# Patient Record
Sex: Female | Born: 1990 | Race: White | Hispanic: No | Marital: Single | Smoking: Never smoker
Health system: Southern US, Community
[De-identification: ages and names within clinical notes are randomized; demographics above are authoritative.]

## PROBLEM LIST (undated history)

## (undated) DIAGNOSIS — N809 Endometriosis, unspecified: Secondary | ICD-10-CM

## (undated) DIAGNOSIS — R350 Frequency of micturition: Secondary | ICD-10-CM

## (undated) DIAGNOSIS — G43909 Migraine, unspecified, not intractable, without status migrainosus: Secondary | ICD-10-CM

## (undated) DIAGNOSIS — R102 Pelvic and perineal pain: Secondary | ICD-10-CM

## (undated) DIAGNOSIS — G43019 Migraine without aura, intractable, without status migrainosus: Secondary | ICD-10-CM

## (undated) DIAGNOSIS — E282 Polycystic ovarian syndrome: Secondary | ICD-10-CM

## (undated) HISTORY — PX: TONSILLECTOMY: SUR1361

## (undated) HISTORY — PX: ABDOMINAL HYSTERECTOMY: SHX81

## (undated) HISTORY — DX: Migraine without aura, intractable, without status migrainosus: G43.019

## (undated) HISTORY — DX: Migraine, unspecified, not intractable, without status migrainosus: G43.909

## (undated) HISTORY — PX: OTHER SURGICAL HISTORY: SHX169

---

## 2010-01-30 ENCOUNTER — Emergency Department (HOSPITAL_BASED_OUTPATIENT_CLINIC_OR_DEPARTMENT_OTHER): Admission: EM | Admit: 2010-01-30 | Discharge: 2010-01-30 | Payer: Self-pay | Admitting: Emergency Medicine

## 2010-03-21 ENCOUNTER — Emergency Department (HOSPITAL_BASED_OUTPATIENT_CLINIC_OR_DEPARTMENT_OTHER): Admission: EM | Admit: 2010-03-21 | Discharge: 2010-03-21 | Payer: Self-pay | Admitting: Emergency Medicine

## 2010-06-29 ENCOUNTER — Ambulatory Visit: Payer: Self-pay | Admitting: Diagnostic Radiology

## 2010-06-29 ENCOUNTER — Emergency Department (HOSPITAL_BASED_OUTPATIENT_CLINIC_OR_DEPARTMENT_OTHER): Admission: EM | Admit: 2010-06-29 | Discharge: 2010-06-29 | Payer: Self-pay | Admitting: Emergency Medicine

## 2010-07-09 ENCOUNTER — Emergency Department (HOSPITAL_BASED_OUTPATIENT_CLINIC_OR_DEPARTMENT_OTHER): Admission: EM | Admit: 2010-07-09 | Discharge: 2010-07-09 | Payer: Self-pay | Admitting: Emergency Medicine

## 2010-12-09 LAB — URINALYSIS, ROUTINE W REFLEX MICROSCOPIC
Bilirubin Urine: NEGATIVE
Glucose, UA: NEGATIVE mg/dL
Leukocytes, UA: NEGATIVE
Specific Gravity, Urine: 1.021 (ref 1.005–1.030)
Specific Gravity, Urine: 1.028 (ref 1.005–1.030)
Urobilinogen, UA: 0.2 mg/dL (ref 0.0–1.0)
pH: 5.5 (ref 5.0–8.0)
pH: 6 (ref 5.0–8.0)

## 2010-12-09 LAB — URINE MICROSCOPIC-ADD ON

## 2010-12-09 LAB — DIFFERENTIAL
Lymphs Abs: 2.4 10*3/uL (ref 0.7–4.0)
Monocytes Absolute: 0.4 10*3/uL (ref 0.1–1.0)
Monocytes Relative: 5 % (ref 3–12)
Neutro Abs: 5.1 10*3/uL (ref 1.7–7.7)
Neutrophils Relative %: 63 % (ref 43–77)

## 2010-12-09 LAB — WET PREP, GENITAL: Yeast Wet Prep HPF POC: NONE SEEN

## 2010-12-09 LAB — BASIC METABOLIC PANEL
CO2: 26 mEq/L (ref 19–32)
Calcium: 9.2 mg/dL (ref 8.4–10.5)
GFR calc Af Amer: >60 mL/min (ref >60)
GFR calc non Af Amer: >60 mL/min (ref >60)
Potassium: 3.8 mEq/L (ref 3.5–5.1)
Sodium: 139 mEq/L (ref 135–145)

## 2010-12-09 LAB — CBC
HCT: 38 % (ref 36.0–46.0)
Hemoglobin: 13.1 g/dL (ref 12.0–15.0)
RBC: 4.2 MIL/uL (ref 3.87–5.11)

## 2010-12-09 LAB — GC/CHLAMYDIA PROBE AMP, GENITAL
GC Probe Amp, Genital: NEGATIVE
GC Probe Amp, Genital: NEGATIVE

## 2010-12-09 LAB — PREGNANCY, URINE: Preg Test, Ur: NEGATIVE

## 2010-12-12 LAB — URINALYSIS, ROUTINE W REFLEX MICROSCOPIC
Glucose, UA: NEGATIVE mg/dL
Protein, ur: 100 mg/dL — AB
Specific Gravity, Urine: 1.037 — ABNORMAL HIGH (ref 1.005–1.030)

## 2010-12-12 LAB — URINE CULTURE: Culture: NO GROWTH

## 2010-12-12 LAB — URINE MICROSCOPIC-ADD ON

## 2010-12-23 ENCOUNTER — Emergency Department (INDEPENDENT_AMBULATORY_CARE_PROVIDER_SITE_OTHER)

## 2010-12-23 ENCOUNTER — Emergency Department (HOSPITAL_BASED_OUTPATIENT_CLINIC_OR_DEPARTMENT_OTHER)
Admission: EM | Admit: 2010-12-23 | Discharge: 2010-12-23 | Disposition: A | Discharge status: Home / Self Care | Attending: Emergency Medicine | Admitting: Emergency Medicine

## 2010-12-23 ENCOUNTER — Emergency Department (HOSPITAL_BASED_OUTPATIENT_CLINIC_OR_DEPARTMENT_OTHER)

## 2010-12-23 DIAGNOSIS — M7989 Other specified soft tissue disorders: Secondary | ICD-10-CM

## 2010-12-23 DIAGNOSIS — M25469 Effusion, unspecified knee: Secondary | ICD-10-CM | POA: Insufficient documentation

## 2010-12-23 DIAGNOSIS — M25569 Pain in unspecified knee: Secondary | ICD-10-CM

## 2010-12-24 ENCOUNTER — Ambulatory Visit (INDEPENDENT_AMBULATORY_CARE_PROVIDER_SITE_OTHER): Admitting: Family Medicine

## 2010-12-24 ENCOUNTER — Encounter: Payer: Self-pay | Admitting: Family Medicine

## 2010-12-24 VITALS — BP 117/80 | HR 80 | Temp 98.1°F | Ht 63.0 in | Wt 112.0 lb

## 2010-12-24 DIAGNOSIS — M25569 Pain in unspecified knee: Secondary | ICD-10-CM

## 2010-12-24 DIAGNOSIS — M25562 Pain in left knee: Secondary | ICD-10-CM

## 2010-12-24 MED ORDER — HYDROCODONE-ACETAMINOPHEN 5-500 MG PO TABS: 1.0000 | ORAL_TABLET | Freq: Four times a day (QID) | ORAL | Status: DC | PRN

## 2010-12-24 NOTE — Patient Instructions (Signed)
The swelling of your knee, pain on the outside, lack of motion are all concerning for a meniscal tear or other damage to the inside of your knee. We will move forward with an MRI to further assess this - we will call you with the date and time of this. Take aleve 2 tabs twice a day with food in the meantime (stop the ibuprofen). Discontinue the knee immobilizer. Use crutches as needed. Ice the area 15 minutes at a time 3-4 times a day. Do straight leg raises and quad sets 3 sets of 15 once a day to maintain your quad strength. Consider ACE wrap to help with comfort and swelling. We will contact you with the MRI results and how to proceed following this.

## 2010-12-24 NOTE — Progress Notes (Signed)
  Subjective:    Patient ID: Amanda Vazquez, female    DOB: 04/18/1991, 20 y.o.   MRN: [id number]  HPI 20 yo F here with left knee pain  Patient reports no known injury States yesterday was on her feet a lot when started developing anterolateral left knee pain Also concurrent with swelling, difficulty bearing weight, and inability to fully extend or flex knee. No true catching or locking. No giving out. No prior left knee injuries. Went to ED where x-rays were negative - placed in immobilizer and given crutches.  Also given ibuprofen 800 which she has been taking but not noticed any benefit. No FH gout.  History reviewed. No pertinent past medical history.  No current outpatient prescriptions on file prior to visit.    History reviewed. No pertinent past surgical history.  No Known Allergies  History   Social History  . Marital Status: Single    Spouse Name: N/A    Number of Children: N/A  . Years of Education: N/A   Occupational History  . Not on file.   Social History Main Topics  . Smoking status: Never Smoker   . Smokeless tobacco: Not on file  . Alcohol Use: Not on file  . Drug Use: Not on file  . Sexually Active: Not on file   Other Topics Concern  . Not on file   Social History Narrative  . No narrative on file    Family History  Problem Relation Age of Onset  . Diabetes Sister   . Diabetes Maternal Grandfather   . Hypertension Maternal Grandfather   . Hypertension Paternal Grandmother   . Heart attack Neg Hx     BP 117/80  Pulse 80  Temp(Src) 98.1 F (36.7 C) (Oral)  Ht 5\' 3"  (1.6 m)  Wt 112 lb (50.803 kg)  BMI 19.84 kg/m2  Review of Systems See HPI above.    Objective:   Physical Exam Gen: NAD L knee: Mild effusion and warmth.  No deformity, bruising otherwise. TTP lateral joint line.  No medial joint line, post patellar or other TTP about left knee. ROM 5-90 degrees - pain at extents of motion. Stable to valgus/varus stress  without pain Negative ant/post drawers and negative lachmanns Mcmurrays with pain laterally.  + Apleys.  Unable to do sit home 2/2 pain.  Difficulty bearing weight to allow for thessalys Negative apprehension. NVI distally.  R knee: FROM without pain, swelling, weakness, instability.    Assessment & Plan:  1. L knee pain:  Reviewed radiographs which were negative except for joint effusion.  No h/o gout or FH gout but no acute injury.  Pain mostly lateral with pain on mcmurrays and decreased ROM.  Concerned about intraarticular pathology - will proceed with MRI to further assess.  Ligamentous testing negative.  Negative apprehension so doubt patellar subluxation/dislocation.  Discontinue immobilizer.  Quad strengthening exercises, aleve, icing.  Will call with MRI results.

## 2010-12-24 NOTE — Assessment & Plan Note (Signed)
Reviewed radiographs which were negative except for joint effusion.  No h/o gout or FH gout but no acute injury.  Pain mostly lateral with pain on mcmurrays and decreased ROM.  Concerned about intraarticular pathology - will proceed with MRI to further assess.  Ligamentous testing negative.  Negative apprehension so doubt patellar subluxation/dislocation.  Discontinue immobilizer.  Quad strengthening exercises, aleve, icing.  Will call with MRI results.

## 2010-12-25 ENCOUNTER — Ambulatory Visit (HOSPITAL_BASED_OUTPATIENT_CLINIC_OR_DEPARTMENT_OTHER)
Admission: RE | Admit: 2010-12-25 | Discharge: 2010-12-25 | Disposition: A | Discharge status: Home / Self Care | Source: Ambulatory Visit | Attending: Family Medicine | Admitting: Family Medicine

## 2010-12-25 ENCOUNTER — Emergency Department (HOSPITAL_BASED_OUTPATIENT_CLINIC_OR_DEPARTMENT_OTHER)
Admission: EM | Admit: 2010-12-25 | Discharge: 2010-12-25 | Disposition: A | Discharge status: Home / Self Care | Attending: Emergency Medicine | Admitting: Emergency Medicine

## 2010-12-25 DIAGNOSIS — N9489 Other specified conditions associated with female genital organs and menstrual cycle: Secondary | ICD-10-CM | POA: Insufficient documentation

## 2010-12-25 DIAGNOSIS — N83209 Unspecified ovarian cyst, unspecified side: Secondary | ICD-10-CM | POA: Insufficient documentation

## 2010-12-25 DIAGNOSIS — M25569 Pain in unspecified knee: Secondary | ICD-10-CM | POA: Insufficient documentation

## 2010-12-25 DIAGNOSIS — R109 Unspecified abdominal pain: Secondary | ICD-10-CM | POA: Insufficient documentation

## 2010-12-25 DIAGNOSIS — M25419 Effusion, unspecified shoulder: Secondary | ICD-10-CM | POA: Insufficient documentation

## 2010-12-25 LAB — PREGNANCY, URINE: Preg Test, Ur: NEGATIVE

## 2010-12-25 LAB — WET PREP, GENITAL: Yeast Wet Prep HPF POC: NONE SEEN

## 2010-12-25 LAB — URINALYSIS, ROUTINE W REFLEX MICROSCOPIC
Hgb urine dipstick: NEGATIVE
Nitrite: NEGATIVE
Specific Gravity, Urine: 1.021 (ref 1.005–1.030)
Urobilinogen, UA: 0.2 mg/dL (ref 0.0–1.0)

## 2010-12-27 ENCOUNTER — Encounter: Payer: Self-pay | Admitting: Family Medicine

## 2010-12-27 ENCOUNTER — Other Ambulatory Visit: Payer: Self-pay | Admitting: Family Medicine

## 2010-12-27 ENCOUNTER — Ambulatory Visit (INDEPENDENT_AMBULATORY_CARE_PROVIDER_SITE_OTHER): Admitting: Family Medicine

## 2010-12-27 VITALS — BP 100/69 | HR 76 | Temp 98.1°F

## 2010-12-27 DIAGNOSIS — M25562 Pain in left knee: Secondary | ICD-10-CM

## 2010-12-27 DIAGNOSIS — M25469 Effusion, unspecified knee: Secondary | ICD-10-CM

## 2010-12-27 DIAGNOSIS — M25569 Pain in unspecified knee: Secondary | ICD-10-CM

## 2010-12-27 DIAGNOSIS — M25462 Effusion, left knee: Secondary | ICD-10-CM

## 2010-12-27 NOTE — Assessment & Plan Note (Signed)
Advised given effusion without evidence of meniscal tear, ligament tear, OCD, or other intraarticular pathology we should proceed with aspiration and send for crystal analysis, cell count, culture.  Only able to obtain 8mL after procedure - advised lab to add gram stain and glucose/protein if were able to but the other three are top priority.  Differential includes gout, septic arthritis (afebrile, no redness or source - unlikely), inflammatory arthritis, lyme, among others.  Will contact patient with results of labwork when available - may need to proceed with bloodwork +/- rheum referral.  Continue icing, aleve in meantime.  INJECTION: Patient was given informed consent, signed copy in the chart. Appropriate time out was taken. Area prepped and draped in usual sterile fashion. Patient lying supine on exam table - using superolateral approach, 4 mL marcaine used for local after iodine/alcohol swab to prep area.  Then large bore needle and syringe used - aspirated about 8 mL of clear straw-colored fluid from knee joint.  Sent for labs as noted above.  The patient tolerated the procedure well. There were no complications. Post procedure instructions were given.

## 2010-12-27 NOTE — Progress Notes (Signed)
Subjective:    Patient ID: Amanda Vazquez, female    DOB: 1991/05/11, 20 y.o.   MRN: [id number]  Knee Pain   20 yo F here for f/u left knee pain  Patient reported no known injury Stated was on her feet a lot late last week when started developing anterolateral left knee pain Also concurrent with swelling, difficulty bearing weight, and inability to fully extend or flex knee. No true catching or locking. No giving out. No prior left knee injuries. Went to ED where x-rays were negative - placed in immobilizer and given crutches.  Also given ibuprofen 800 which she had been taking but not noticed any benefit. No FH gout, inflammatory joint disease Denies tick exposure, h/o gonorrhea, personal h/o gout, fevers. MRI showed mod effusion but no evidence of meniscal, ligamentous or other intraarticular pathology.  History reviewed. No pertinent past medical history.  Current Outpatient Prescriptions on File Prior to Visit  Medication Sig Dispense Refill  . HYDROcodone-acetaminophen (VICODIN) 5-500 MG per tablet Take 1 tablet by mouth every 6 (six) hours as needed for pain.  30 tablet  0    History reviewed. No pertinent past surgical history.  No Known Allergies  History   Social History  . Marital Status: Single    Spouse Name: N/A    Number of Children: N/A  . Years of Education: N/A   Occupational History  . Not on file.   Social History Main Topics  . Smoking status: Never Smoker   . Smokeless tobacco: Not on file  . Alcohol Use: Not on file  . Drug Use: Not on file  . Sexually Active: Not on file   Other Topics Concern  . Not on file   Social History Narrative  . No narrative on file    Family History  Problem Relation Age of Onset  . Diabetes Sister   . Diabetes Maternal Grandfather   . Hypertension Maternal Grandfather   . Hypertension Paternal Grandmother   . Heart attack Neg Hx     BP 100/69  Pulse 76  Temp 98.1 F (36.7 C)  Review of  Systems  See HPI above.    Objective:   Physical Exam  Gen: NAD L knee: Mild effusion, minimal warmth.  No deformity, bruising otherwise. Mild TTP lateral joint line.  No medial joint line, post patellar or other TTP about left knee. ROM 5-90 degrees - pain at extents of motion. Stable to valgus/varus stress without pain NVI distally.  R knee: FROM without pain, swelling, weakness, instability.    Assessment & Plan:  1. L knee pain:  Advised given effusion without evidence of meniscal tear, ligament tear, OCD, or other intraarticular pathology we should proceed with aspiration and send for crystal analysis, cell count, culture.  Only able to obtain 8mL after procedure - advised lab to add gram stain and glucose/protein if were able to but the other three are top priority.  Differential includes gout, septic arthritis (afebrile, no redness or source - unlikely), inflammatory arthritis, lyme, among others.  Will contact patient with results of labwork when available - may need to proceed with bloodwork +/- rheum referral.  Continue icing, aleve in meantime.  INJECTION: Patient was given informed consent, signed copy in the chart. Appropriate time out was taken. Area prepped and draped in usual sterile fashion. Patient lying supine on exam table - using superolateral approach, 4 mL marcaine used for local after iodine/alcohol swab to prep area.  Then large bore needle  and syringe used - aspirated about 8 mL of clear straw-colored fluid from knee joint.  Sent for labs as noted above.  The patient tolerated the procedure well. There were no complications. Post procedure instructions were given.

## 2010-12-28 LAB — SYNOVIAL CELL COUNT + DIFF, W/ CRYSTALS
Crystals, Fluid: NONE SEEN
Eosinophils-Synovial: 1 % (ref 0–1)
Lymphocytes-Synovial Fld: 25 % — ABNORMAL HIGH (ref 0–20)
Monocyte/Macrophage: 73 % (ref 50–90)
Neutrophil, Synovial: 1 % (ref 0–25)
WBC, Synovial: 3420 uL — ABNORMAL HIGH (ref 0–200)

## 2010-12-30 NOTE — Assessment & Plan Note (Signed)
Spoke with patient regarding synovial fluid results.  Only able to obtain 8cc of fluid - analyzed for crystals and cell count though did not appear infectious on aspiration.  No evidence of crystals and WBC count <4000 with only 1% neutrophils.  Will proceed with bloodwork (cbc with diff, cmp, esr, crp, ana, RF, urine GC, Lyme Abs) and referral to rheumatology.  Continue with nsaids, icing in meantime.  Patient plans to come in on Monday for labwork.

## 2010-12-30 NOTE — Progress Notes (Signed)
Addended byNorton Blizzard on: 12/30/2010 10:20 AM   Modules accepted: Orders

## 2011-01-25 ENCOUNTER — Encounter: Payer: Self-pay | Admitting: *Deleted

## 2011-01-31 ENCOUNTER — Encounter: Payer: Self-pay | Admitting: Family Medicine

## 2011-03-27 ENCOUNTER — Emergency Department (HOSPITAL_COMMUNITY)
Admission: EM | Admit: 2011-03-27 | Discharge: 2011-03-27 | Disposition: A | Discharge status: Home / Self Care | Source: Home / Self Care | Attending: Emergency Medicine | Admitting: Emergency Medicine

## 2011-03-27 ENCOUNTER — Emergency Department (HOSPITAL_COMMUNITY)

## 2011-03-27 ENCOUNTER — Emergency Department (HOSPITAL_COMMUNITY)
Admission: EM | Admit: 2011-03-27 | Discharge: 2011-03-27 | Disposition: A | Discharge status: Other Acute Inpatient Hospital | Attending: Emergency Medicine | Admitting: Emergency Medicine

## 2011-03-27 DIAGNOSIS — N760 Acute vaginitis: Secondary | ICD-10-CM | POA: Insufficient documentation

## 2011-03-27 DIAGNOSIS — R1012 Left upper quadrant pain: Secondary | ICD-10-CM | POA: Insufficient documentation

## 2011-03-27 DIAGNOSIS — R1110 Vomiting, unspecified: Secondary | ICD-10-CM | POA: Insufficient documentation

## 2011-03-27 DIAGNOSIS — N949 Unspecified condition associated with female genital organs and menstrual cycle: Secondary | ICD-10-CM | POA: Insufficient documentation

## 2011-03-27 DIAGNOSIS — R10814 Left lower quadrant abdominal tenderness: Secondary | ICD-10-CM | POA: Insufficient documentation

## 2011-03-27 DIAGNOSIS — A499 Bacterial infection, unspecified: Secondary | ICD-10-CM | POA: Insufficient documentation

## 2011-03-27 DIAGNOSIS — B9689 Other specified bacterial agents as the cause of diseases classified elsewhere: Secondary | ICD-10-CM | POA: Insufficient documentation

## 2011-03-27 LAB — CBC
HCT: 40.8 % (ref 36.0–46.0)
Hemoglobin: 14.7 g/dL (ref 12.0–15.0)
MCH: 30.1 pg (ref 26.0–34.0)
MCHC: 36 g/dL (ref 30.0–36.0)
MCV: 83.4 fL (ref 78.0–100.0)

## 2011-03-27 LAB — URINALYSIS, ROUTINE W REFLEX MICROSCOPIC
Bilirubin Urine: NEGATIVE
Hgb urine dipstick: NEGATIVE
Specific Gravity, Urine: 1.017 (ref 1.005–1.030)
pH: 6.5 (ref 5.0–8.0)

## 2011-03-27 LAB — BASIC METABOLIC PANEL
CO2: 25 mEq/L (ref 19–32)
GFR calc non Af Amer: >60 mL/min (ref >60)
Glucose, Bld: 95 mg/dL (ref 70–99)
Potassium: 3.6 mEq/L (ref 3.5–5.1)
Sodium: 137 mEq/L (ref 135–145)

## 2011-03-27 LAB — DIFFERENTIAL
Lymphocytes Relative: 33 % (ref 12–46)
Lymphs Abs: 3.6 10*3/uL (ref 0.7–4.0)
Monocytes Absolute: 0.8 10*3/uL (ref 0.1–1.0)
Monocytes Relative: 7 % (ref 3–12)
Neutro Abs: 6.4 10*3/uL (ref 1.7–7.7)

## 2011-03-27 LAB — WET PREP, GENITAL: Trich, Wet Prep: NONE SEEN

## 2011-03-28 LAB — GC/CHLAMYDIA PROBE AMP, GENITAL: Chlamydia, DNA Probe: NEGATIVE

## 2011-04-21 ENCOUNTER — Emergency Department (HOSPITAL_BASED_OUTPATIENT_CLINIC_OR_DEPARTMENT_OTHER)
Admission: EM | Admit: 2011-04-21 | Discharge: 2011-04-21 | Disposition: A | Discharge status: Home / Self Care | Attending: Emergency Medicine | Admitting: Emergency Medicine

## 2011-04-21 ENCOUNTER — Encounter (HOSPITAL_BASED_OUTPATIENT_CLINIC_OR_DEPARTMENT_OTHER): Payer: Self-pay | Admitting: Student

## 2011-04-21 ENCOUNTER — Emergency Department (INDEPENDENT_AMBULATORY_CARE_PROVIDER_SITE_OTHER)

## 2011-04-21 DIAGNOSIS — R1031 Right lower quadrant pain: Secondary | ICD-10-CM

## 2011-04-21 DIAGNOSIS — R110 Nausea: Secondary | ICD-10-CM | POA: Insufficient documentation

## 2011-04-21 DIAGNOSIS — R109 Unspecified abdominal pain: Secondary | ICD-10-CM

## 2011-04-21 DIAGNOSIS — N949 Unspecified condition associated with female genital organs and menstrual cycle: Secondary | ICD-10-CM

## 2011-04-21 HISTORY — DX: Polycystic ovarian syndrome: E28.2

## 2011-04-21 LAB — COMPREHENSIVE METABOLIC PANEL
ALT: 12 U/L (ref 0–35)
AST: 17 U/L (ref 0–37)
Alkaline Phosphatase: 49 U/L (ref 39–117)
CO2: 27 mEq/L (ref 19–32)
Chloride: 102 mEq/L (ref 96–112)
GFR calc non Af Amer: >60 mL/min (ref >60)
Potassium: 4.6 mEq/L (ref 3.5–5.1)
Sodium: 139 mEq/L (ref 135–145)
Total Bilirubin: 0.7 mg/dL (ref 0.3–1.2)

## 2011-04-21 LAB — COMPREHENSIVE METABOLIC PANEL WITH GFR
Albumin: 4.8 g/dL (ref 3.5–5.2)
BUN: 11 mg/dL (ref 6–23)
Calcium: 10 mg/dL (ref 8.4–10.5)
Creatinine, Ser: 0.6 mg/dL (ref 0.50–1.10)
GFR calc Af Amer: >60 mL/min (ref >60)
Glucose, Bld: 90 mg/dL (ref 70–99)
Total Protein: 7.6 g/dL (ref 6.0–8.3)

## 2011-04-21 LAB — DIFFERENTIAL
Basophils Absolute: 0 10*3/uL (ref 0.0–0.1)
Basophils Relative: 0 % (ref 0–1)
Eosinophils Absolute: 0.2 K/uL (ref 0.0–0.7)
Eosinophils Relative: 1 % (ref 0–5)
Lymphocytes Relative: 5 % — ABNORMAL LOW (ref 12–46)
Lymphs Abs: 1 K/uL (ref 0.7–4.0)
Monocytes Absolute: 1.2 10*3/uL — ABNORMAL HIGH (ref 0.1–1.0)
Monocytes Relative: 5 % (ref 3–12)
Neutro Abs: 19.7 10*3/uL — ABNORMAL HIGH (ref 1.7–7.7)
Neutrophils Relative %: 89 % — ABNORMAL HIGH (ref 43–77)

## 2011-04-21 LAB — CBC
HCT: 43.4 % (ref 36.0–46.0)
Hemoglobin: 15.5 g/dL — ABNORMAL HIGH (ref 12.0–15.0)
MCH: 30.2 pg (ref 26.0–34.0)
MCHC: 35.7 g/dL (ref 30.0–36.0)
MCV: 84.6 fL (ref 78.0–100.0)
Platelets: 294 10*3/uL (ref 150–400)
RBC: 5.13 MIL/uL — ABNORMAL HIGH (ref 3.87–5.11)
RDW: 12.4 % (ref 11.5–15.5)
WBC: 22.1 10*3/uL — ABNORMAL HIGH (ref 4.0–10.5)

## 2011-04-21 LAB — URINALYSIS, ROUTINE W REFLEX MICROSCOPIC
Bilirubin Urine: NEGATIVE
Glucose, UA: NEGATIVE mg/dL
Hgb urine dipstick: NEGATIVE
Ketones, ur: NEGATIVE mg/dL
Leukocytes, UA: NEGATIVE
Nitrite: NEGATIVE
Protein, ur: NEGATIVE mg/dL
Specific Gravity, Urine: 1.02 (ref 1.005–1.030)
Urobilinogen, UA: 0.2 mg/dL (ref 0.0–1.0)
pH: 5.5 (ref 5.0–8.0)

## 2011-04-21 LAB — WET PREP, GENITAL
Clue Cells Wet Prep HPF POC: NONE SEEN
Trich, Wet Prep: NONE SEEN
WBC, Wet Prep HPF POC: NONE SEEN
Yeast Wet Prep HPF POC: NONE SEEN

## 2011-04-21 LAB — PREGNANCY, URINE: Preg Test, Ur: NEGATIVE

## 2011-04-21 MED ORDER — MORPHINE SULFATE 4 MG/ML IJ SOLN
4.0000 mg | Freq: Once | INTRAMUSCULAR | Status: AC
  Administered 2011-04-21: 4 mg via INTRAVENOUS

## 2011-04-21 MED ORDER — MORPHINE SULFATE 4 MG/ML IJ SOLN
4.0000 mg | Freq: Once | INTRAMUSCULAR | Status: AC
  Administered 2011-04-21: 4 mg via INTRAVENOUS
  Filled 2011-04-21: qty 1

## 2011-04-21 MED ORDER — HYDROCODONE-ACETAMINOPHEN 5-325 MG PO TABS: 1.0000 | ORAL_TABLET | ORAL | Status: AC | PRN

## 2011-04-21 MED ORDER — MORPHINE SULFATE 4 MG/ML IJ SOLN
INTRAMUSCULAR | Status: AC
  Administered 2011-04-21: 4 mg via INTRAVENOUS
  Filled 2011-04-21: qty 1

## 2011-04-21 MED ORDER — ONDANSETRON HCL 4 MG/2ML IJ SOLN
4.0000 mg | Freq: Once | INTRAMUSCULAR | Status: AC
  Administered 2011-04-21: 4 mg via INTRAVENOUS
  Filled 2011-04-21: qty 2

## 2011-04-21 MED ORDER — ONDANSETRON 8 MG PO TBDP: 8.0000 mg | ORAL_TABLET | Freq: Three times a day (TID) | ORAL | Status: DC | PRN

## 2011-04-21 MED ORDER — ONDANSETRON 8 MG PO TBDP: 8.0000 mg | ORAL_TABLET | Freq: Three times a day (TID) | ORAL | Status: AC | PRN

## 2011-04-21 MED ORDER — METOCLOPRAMIDE HCL 5 MG/ML IJ SOLN
10.0000 mg | Freq: Once | INTRAMUSCULAR | Status: AC
  Administered 2011-04-21: 10 mg via INTRAVENOUS
  Filled 2011-04-21: qty 2

## 2011-04-21 MED ORDER — SODIUM CHLORIDE 0.9 % IV BOLUS (SEPSIS)
1000.0000 mL | Freq: Once | INTRAVENOUS | Status: AC
  Administered 2011-04-21: 1000 mL via INTRAVENOUS

## 2011-04-21 MED ORDER — IOHEXOL 300 MG/ML  SOLN
100.0000 mL | Freq: Once | INTRAMUSCULAR | Status: AC | PRN
  Administered 2011-04-21: 100 mL via INTRAVENOUS

## 2011-04-21 NOTE — ED Provider Notes (Signed)
Supervised pt's visit with PAC Schinlever  Gavin Pound. Oletta Lamas, MD 04/21/11 2146

## 2011-04-21 NOTE — Discharge Instructions (Signed)
Take vicodin as prescribed for severe pain.   Do not drive within four hours of taking this medication (may cause drowsiness or confusion).  Take zofran as needed for nausea.   Follow up with your primary care doctor and/or gynecologist if symptoms persist.  You should return to the ER if your abdominal pain worsens, you develop fever or you have uncontrolled vomiting.

## 2011-04-21 NOTE — ED Notes (Signed)
Pt in with c/o generalized upper and mid abd pain with sudden onset and with associated N V with onset. Pt reports to ED from [facility] with request for rule our appendicitis.

## 2011-04-21 NOTE — ED Notes (Signed)
Patient is resting comfortably. No needs voiced at this time 

## 2011-04-21 NOTE — ED Notes (Signed)
Pt drinking contrast for CT Scan. No further needs at this time.

## 2011-04-21 NOTE — ED Provider Notes (Signed)
History     Chief Complaint  Patient presents with  . Abdominal Pain   HPI Pt referred by physician at walk in clinic for evaluation for appendicitis.  She had acute onset nausea at 10am today and vomiting, diffuse abd pain at 11.  No f/c, hematemesis, diarrhea, GU symptoms.   Recent exposure to gastroenteritis.  H/o hemorrhagic ovarian cyst but this feels different.  No h/o abd surgeries.  Had leukocytosis at walk-in clinic.    Past Medical History  Diagnosis Date  . Polycystic ovary disease     History reviewed. No pertinent past surgical history.  Family History  Problem Relation Age of Onset  . Diabetes Sister   . Diabetes Maternal Grandfather   . Hypertension Maternal Grandfather   . Hypertension Paternal Grandmother   . Heart attack Neg Hx     History  Substance Use Topics  . Smoking status: Never Smoker   . Smokeless tobacco: Never Used  . Alcohol Use: Yes    OB History    Grav Para Term Preterm Abortions TAB SAB Ect Mult Living                  Review of Systems  All other systems reviewed and are negative.    Physical Exam  BP 112/72  Pulse 99  Temp(Src) 99.2 F (37.3 C) (Oral)  Resp 20  Wt 116 lb (52.617 kg)  SpO2 100%  LMP 03/31/2011  Physical Exam  Nursing note and vitals reviewed. Constitutional: She is oriented to person, place, and time. She appears well-developed and well-nourished. No distress.  HENT:  Head: Normocephalic and atraumatic.  Eyes:       Normal appearance  Neck: Normal range of motion.  Cardiovascular: Normal rate and regular rhythm.   Pulmonary/Chest: Effort normal and breath sounds normal.  Abdominal: Soft. Bowel sounds are normal. She exhibits no distension and no mass. There is no rebound and no guarding.       Diffuse, mild upper abd ttp and tenderness of RLQ, including McBurney's point. No CVA tenderness  Neurological: She is alert and oriented to person, place, and time.  Skin: Skin is warm and dry. No rash noted.   Psychiatric: She has a normal mood and affect. Her behavior is normal.    ED Course  Procedures  MDM Pt referred to ER by walk-in clinic for abd pain, N/V, leukocytosis; r/o appendicitis.  Presentation atypical but pt has WBC count 22,000 and RLQ ttp on exam.  CT abd/pelvis pending.  Pt receiving IV fluids and reglan.   CT neg for appendicits.  Performed pelvic exam (nml external genitalia, no vaginal discharge/bleeding, nml cervix, R adnexal ttp).  Wet prep neg.  Discussed w/ Dr. Oletta Lamas.  He does not recommend further imaging tonight.  Pt advised to f/u with PCP and return to ER if pain worsens or she has uncontrolled vomiting.  D/c'd home w/ pain/nausea medicine.    Amanda Vazquez [place], [place] 04/21/11 2330

## 2011-04-23 LAB — GC/CHLAMYDIA PROBE AMP, GENITAL: GC Probe Amp, Genital: NEGATIVE

## 2011-04-27 LAB — GLUCOSE, CAPILLARY: Glucose-Capillary: 128 mg/dL — ABNORMAL HIGH (ref 70–99)

## 2011-04-30 ENCOUNTER — Encounter (HOSPITAL_BASED_OUTPATIENT_CLINIC_OR_DEPARTMENT_OTHER): Payer: Self-pay | Admitting: Emergency Medicine

## 2011-04-30 ENCOUNTER — Emergency Department (HOSPITAL_BASED_OUTPATIENT_CLINIC_OR_DEPARTMENT_OTHER)
Admission: EM | Admit: 2011-04-30 | Discharge: 2011-04-30 | Disposition: A | Discharge status: Home / Self Care | Attending: Emergency Medicine | Admitting: Emergency Medicine

## 2011-04-30 DIAGNOSIS — N9489 Other specified conditions associated with female genital organs and menstrual cycle: Secondary | ICD-10-CM | POA: Insufficient documentation

## 2011-04-30 DIAGNOSIS — R109 Unspecified abdominal pain: Secondary | ICD-10-CM | POA: Insufficient documentation

## 2011-04-30 LAB — URINALYSIS, ROUTINE W REFLEX MICROSCOPIC
Bilirubin Urine: NEGATIVE
Glucose, UA: NEGATIVE mg/dL
Ketones, ur: NEGATIVE mg/dL
Leukocytes, UA: NEGATIVE
pH: 6.5 (ref 5.0–8.0)

## 2011-04-30 LAB — URINE MICROSCOPIC-ADD ON

## 2011-04-30 MED ORDER — HYDROCODONE-ACETAMINOPHEN 5-325 MG PO TABS
1.0000 | ORAL_TABLET | Freq: Once | ORAL | Status: AC
  Administered 2011-04-30: 1 via ORAL
  Filled 2011-04-30: qty 1

## 2011-04-30 MED ORDER — HYDROCODONE-ACETAMINOPHEN 5-500 MG PO TABS: 1.0000 | ORAL_TABLET | Freq: Four times a day (QID) | ORAL | Status: AC | PRN

## 2011-04-30 NOTE — ED Provider Notes (Signed)
Medical screening examination/treatment/procedure(s) were performed by non-physician practitioner and as supervising physician I was immediately available for consultation/collaboration.    Joya Gaskins, MD 04/30/11 [id number]

## 2011-04-30 NOTE — ED Provider Notes (Signed)
History     CSN: [id number] Arrival date & time: 04/30/2011  3:44 PM  Chief Complaint  Patient presents with  . Pelvic Pain    Pt reports Hx of oavarian cyst and mod amount of bleeding with reg period at this time  3-4 tampons this am    HPI Comments: Pt state that she has a history of a cyst and states that it feels the same  Patient is a 20 y.o. female presenting with pelvic pain. The history is provided by the patient. No language interpreter was used.  Pelvic Pain This is a recurrent problem. The current episode started today. The problem occurs constantly. The problem has been unchanged. Associated symptoms include abdominal pain. Pertinent negatives include no anorexia, fever, nausea, urinary symptoms or visual change. The symptoms are aggravated by nothing. She has tried NSAIDs for the symptoms. The treatment provided no relief.    Past Medical History  Diagnosis Date  . Polycystic ovary disease   . Ovarian cyst rupture     Past Surgical History  Procedure Date  . Tossilectomy     Family History  Problem Relation Age of Onset  . Diabetes Sister   . Diabetes Maternal Grandfather   . Hypertension Maternal Grandfather   . Hypertension Paternal Grandmother   . Heart attack Neg Hx     History  Substance Use Topics  . Smoking status: Never Smoker   . Smokeless tobacco: Never Used  . Alcohol Use: Yes    OB History    Grav Para Term Preterm Abortions TAB SAB Ect Mult Living                  Review of Systems  Constitutional: Negative for fever.  Gastrointestinal: Positive for abdominal pain. Negative for nausea and anorexia.  Genitourinary: Positive for pelvic pain.  All other systems reviewed and are negative.    Physical Exam  BP 120/70  Pulse 75  Temp(Src) 98 F (36.7 C) (Oral)  Resp 18  SpO2 100%  LMP 04/30/2011  Physical Exam  Vitals reviewed. Constitutional: She is oriented to person, place, and time. She appears well-developed and  well-nourished.  HENT:  Head: Normocephalic and atraumatic.  Cardiovascular: Normal rate and regular rhythm.   Pulmonary/Chest: Effort normal and breath sounds normal.  Abdominal: Soft. She exhibits no distension and no mass. There is no tenderness. There is no rebound and no guarding.  Musculoskeletal: Normal range of motion.  Neurological: She is oriented to person, place, and time.  Skin: Skin is warm and dry.  Psychiatric: She has a normal mood and affect.    ED Course  Procedures  Labs Reviewed  URINALYSIS, ROUTINE W REFLEX MICROSCOPIC - Abnormal; Notable for the following:    Hgb urine dipstick LARGE (*)    All other components within normal limits  URINE MICROSCOPIC-ADD ON - Abnormal; Notable for the following:    Squamous Epithelial / LPF FEW (*)    Bacteria, UA MANY (*)    All other components within normal limits  PREGNANCY, URINE    MDM Pt is feeling alot better after the oral medication:pt not pregnant:pt abdominal exam non tender:pt has had a ct in the last week with cultures done:not concerned with torsion      Teressa Lower, NP 04/30/11 1728

## 2011-04-30 NOTE — ED Notes (Signed)
Pt reports L sided pelvic pain with hx of Ovarian cyst and mod large amount of bleeding with reg period

## 2011-05-21 NOTE — ED Provider Notes (Signed)
Evaluation and management procedures were performed by the mid-level provider (PA/NP/CNM) under my supervision/collaboration. I was present and available during the ED course. Sage Kopera Y.   Gavin Pound. Oletta Lamas, MD 05/21/11 2106

## 2011-06-05 ENCOUNTER — Emergency Department (HOSPITAL_BASED_OUTPATIENT_CLINIC_OR_DEPARTMENT_OTHER)
Admission: EM | Admit: 2011-06-05 | Discharge: 2011-06-05 | Disposition: A | Discharge status: Home / Self Care | Attending: Emergency Medicine | Admitting: Emergency Medicine

## 2011-06-05 ENCOUNTER — Encounter (HOSPITAL_BASED_OUTPATIENT_CLINIC_OR_DEPARTMENT_OTHER): Payer: Self-pay | Admitting: *Deleted

## 2011-06-05 DIAGNOSIS — J069 Acute upper respiratory infection, unspecified: Secondary | ICD-10-CM | POA: Insufficient documentation

## 2011-06-05 DIAGNOSIS — J3489 Other specified disorders of nose and nasal sinuses: Secondary | ICD-10-CM | POA: Insufficient documentation

## 2011-06-05 MED ORDER — FEXOFENADINE-PSEUDOEPHED ER 60-120 MG PO TB12: 1.0000 | ORAL_TABLET | Freq: Two times a day (BID) | ORAL | Status: DC

## 2011-06-05 NOTE — ED Provider Notes (Signed)
History     CSN: [id number] Arrival date & time: 06/05/2011  6:25 AM  Chief Complaint  Patient presents with  . Nasal Congestion   HPI Comments: Tried tylenol cold with minimal relief.  Patient is a 20 y.o. female presenting with URI. The history is provided by the patient. No language interpreter was used.  URI The primary symptoms include sore throat, cough and nausea. Primary symptoms do not include fever, headaches, ear pain, swollen glands, wheezing, abdominal pain or vomiting. The current episode started today. This is a new problem. The problem has been gradually worsening.  The sore throat began today. The sore throat has been gradually worsening since its onset. The sore throat is mild in intensity. The sore throat is not accompanied by trouble swallowing or hoarse voice.  The cough began today. The cough is non-productive.  Nausea began today.  Symptoms associated with the illness include congestion and rhinorrhea. The illness is not associated with chills, facial pain or sinus pressure.    Past Medical History  Diagnosis Date  . Polycystic ovary disease   . Ovarian cyst rupture     Past Surgical History  Procedure Date  . Tossilectomy     Family History  Problem Relation Age of Onset  . Diabetes Sister   . Diabetes Maternal Grandfather   . Hypertension Maternal Grandfather   . Hypertension Paternal Grandmother   . Heart attack Neg Hx     History  Substance Use Topics  . Smoking status: Never Smoker   . Smokeless tobacco: Never Used  . Alcohol Use: Yes    OB History    Grav Para Term Preterm Abortions TAB SAB Ect Mult Living                  Review of Systems  Constitutional: Negative for fever, chills, activity change and appetite change.  HENT: Positive for congestion, sore throat, rhinorrhea and postnasal drip. Negative for ear pain, hoarse voice, trouble swallowing, neck pain, neck stiffness and sinus pressure.   Respiratory: Positive for cough.  Negative for chest tightness, shortness of breath and wheezing.   Cardiovascular: Negative for chest pain and palpitations.  Gastrointestinal: Positive for nausea. Negative for vomiting and abdominal pain.  Genitourinary: Negative for dysuria, urgency, frequency and flank pain.  Neurological: Negative for dizziness, weakness, light-headedness, numbness and headaches.  All other systems reviewed and are negative.    Physical Exam  BP 100/63  Pulse 75  Temp 97.5 F (36.4 C)  Resp 19  SpO2 100%  Physical Exam  Nursing note and vitals reviewed. Constitutional: She is oriented to person, place, and time. She appears well-developed and well-nourished. No distress.  HENT:  Head: Normocephalic and atraumatic.  Mouth/Throat: No oropharyngeal exudate.       Mild oropharyngeal erythema.  Nasal congestion.  No facial tenderness on palpation  Eyes: Conjunctivae and EOM are normal. Pupils are equal, round, and reactive to light.  Neck: Normal range of motion. Neck supple.  Cardiovascular: Normal rate, regular rhythm, normal heart sounds and intact distal pulses.  Exam reveals no gallop and no friction rub.   No murmur heard. Pulmonary/Chest: Effort normal and breath sounds normal. No respiratory distress.  Abdominal: Soft. Bowel sounds are normal. There is no tenderness.  Musculoskeletal: Normal range of motion. She exhibits no tenderness.  Lymphadenopathy:    She has no cervical adenopathy.  Neurological: She is alert and oriented to person, place, and time.  Skin: Skin is warm and dry. No  rash noted.    ED Course  Procedures  MDM Viral URI Provided reassurance to the patient. This is secondary to viral illness. She has 0/4 Centor criteria therefore she does not warrant treatment for strep. She does not warrant rapid strep antigen test as well. Recommended oral hydration at home further symptom control at home including Tylenol and Motrin. I'll prescribe her Allegra-D for further  symptom relief and decongestion. I recommend followup with her primary care physician in one week as needed. She is provided signs and symptoms for which to return to the emergency department. Her lungs were clear have no concern for pneumonia and she is afebrile as well.      Dayton Bailiff, MD 06/05/11 [phone]

## 2011-06-05 NOTE — ED Notes (Signed)
Patient states she "has a cold". Woke up this morning with congestion. Took tylenol cold medicine last night.

## 2011-06-05 NOTE — ED Notes (Signed)
Pt states that she has no hx of asthma but has had a runny nose and cold like symptoms since 4:30pm (Saturday).

## 2011-09-06 ENCOUNTER — Emergency Department (HOSPITAL_BASED_OUTPATIENT_CLINIC_OR_DEPARTMENT_OTHER)
Admission: EM | Admit: 2011-09-06 | Discharge: 2011-09-06 | Disposition: A | Discharge status: Home / Self Care | Payer: Self-pay | Attending: Emergency Medicine | Admitting: Emergency Medicine

## 2011-09-06 ENCOUNTER — Encounter (HOSPITAL_BASED_OUTPATIENT_CLINIC_OR_DEPARTMENT_OTHER): Payer: Self-pay | Admitting: *Deleted

## 2011-09-06 DIAGNOSIS — R112 Nausea with vomiting, unspecified: Secondary | ICD-10-CM | POA: Insufficient documentation

## 2011-09-06 DIAGNOSIS — R1013 Epigastric pain: Secondary | ICD-10-CM | POA: Insufficient documentation

## 2011-09-06 LAB — DIFFERENTIAL
Basophils Absolute: 0 10*3/uL (ref 0.0–0.1)
Eosinophils Absolute: 0.1 10*3/uL (ref 0.0–0.7)
Eosinophils Relative: 0 % (ref 0–5)
Lymphocytes Relative: 19 % (ref 12–46)

## 2011-09-06 LAB — COMPREHENSIVE METABOLIC PANEL
ALT: 9 U/L (ref 0–35)
AST: 14 U/L (ref 0–37)
Albumin: 5 g/dL (ref 3.5–5.2)
CO2: 27 mEq/L (ref 19–32)
Calcium: 9.6 mg/dL (ref 8.4–10.5)
Sodium: 138 mEq/L (ref 135–145)
Total Protein: 7.8 g/dL (ref 6.0–8.3)

## 2011-09-06 LAB — CBC
MCV: 84.9 fL (ref 78.0–100.0)
Platelets: 298 10*3/uL (ref 150–400)
RDW: 12.5 % (ref 11.5–15.5)
WBC: 17.7 10*3/uL — ABNORMAL HIGH (ref 4.0–10.5)

## 2011-09-06 LAB — PREGNANCY, URINE: Preg Test, Ur: NEGATIVE

## 2011-09-06 LAB — URINALYSIS, ROUTINE W REFLEX MICROSCOPIC
Glucose, UA: NEGATIVE mg/dL
Hgb urine dipstick: NEGATIVE
Specific Gravity, Urine: 1.014 (ref 1.005–1.030)

## 2011-09-06 MED ORDER — GI COCKTAIL ~~LOC~~
30.0000 mL | Freq: Once | ORAL | Status: AC
  Administered 2011-09-06: 30 mL via ORAL
  Filled 2011-09-06: qty 30

## 2011-09-06 MED ORDER — ONDANSETRON HCL 4 MG/2ML IJ SOLN
4.0000 mg | Freq: Once | INTRAMUSCULAR | Status: AC
  Administered 2011-09-06: 4 mg via INTRAVENOUS
  Filled 2011-09-06: qty 2

## 2011-09-06 MED ORDER — MORPHINE SULFATE 4 MG/ML IJ SOLN
4.0000 mg | Freq: Once | INTRAMUSCULAR | Status: AC
  Administered 2011-09-06: 4 mg via INTRAVENOUS
  Filled 2011-09-06: qty 1

## 2011-09-06 MED ORDER — SODIUM CHLORIDE 0.9 % IV BOLUS (SEPSIS)
1000.0000 mL | Freq: Once | INTRAVENOUS | Status: AC
  Administered 2011-09-06: 1000 mL via INTRAVENOUS

## 2011-09-06 MED ORDER — ONDANSETRON 8 MG PO TBDP: 8.0000 mg | ORAL_TABLET | Freq: Three times a day (TID) | ORAL | Status: AC | PRN

## 2011-09-06 NOTE — ED Notes (Signed)
Dr. Hunt at bedside.

## 2011-09-06 NOTE — ED Notes (Signed)
Pt requesting pain meds   Will notify MD

## 2011-09-06 NOTE — ED Provider Notes (Signed)
History     CSN: [id number] Arrival date & time: 09/06/2011  1:33 AM   First MD Initiated Contact with Patient 09/06/11 0138      Chief Complaint  Patient presents with  . Abdominal Pain    (Consider location/radiation/quality/duration/timing/severity/associated sxs/prior treatment) HPI Patient is a 20 year old female who presents this evening complaining of one hour of severe epigastric pain. She reports that she is also had nausea and vomiting. Her emesis was normal in color. Patient thought she might have food poisoning however the other workers at her job who had eaten the same thing felt fine. Patient denies any fevers or known sick contacts. She has not had a bowel movement since this began. Her last bowel movement was this morning and was normal. Patient says she continues to feel like she needs to have a bowel movement. She has no history of surgeries. She does not believe she could be pregnant and denies vaginal discharge. Patient's pain is an 8/10 and localized to the epigastrium. She says it is a cramping sensation. It fluctuates in intensity. There is nothing that makes it better or worse. There are no other associated or modifying factors. Past Medical History  Diagnosis Date  . Polycystic ovary disease   . Ovarian cyst rupture     Past Surgical History  Procedure Date  . Tossilectomy     Family History  Problem Relation Age of Onset  . Diabetes Sister   . Diabetes Maternal Grandfather   . Hypertension Maternal Grandfather   . Hypertension Paternal Grandmother   . Heart attack Neg Hx     History  Substance Use Topics  . Smoking status: Never Smoker   . Smokeless tobacco: Never Used  . Alcohol Use: Yes    OB History    Grav Para Term Preterm Abortions TAB SAB Ect Mult Living                  Review of Systems  Constitutional: Negative.   HENT: Negative.   Eyes: Negative.   Respiratory: Negative.   Cardiovascular: Negative.   Gastrointestinal:  Positive for nausea, vomiting and abdominal pain.  Genitourinary: Negative.   Musculoskeletal: Negative.   Skin: Negative.   Neurological: Negative.   Hematological: Negative.   Psychiatric/Behavioral: Negative.   All other systems reviewed and are negative.    Allergies  Review of patient's allergies indicates no known allergies.  Home Medications   Current Outpatient Rx  Name Route Sig Dispense Refill  . IBUPROFEN 200 MG PO TABS Oral Take 200 mg by mouth 2 (two) times daily as needed. For pain     . JUNEL 1.5/30 PO Oral Take 1 tablet by mouth daily.     Marland Kitchen FEXOFENADINE-PSEUDOEPHED ER 60-120 MG PO TB12 Oral Take 1 tablet by mouth every 12 (twelve) hours. 30 tablet 0  . ONDANSETRON 8 MG PO TBDP Oral Take 1 tablet (8 mg total) by mouth every 8 (eight) hours as needed for nausea. 20 tablet 0    BP 112/67  Pulse 80  Temp(Src) 97.9 F (36.6 C) (Oral)  Resp 18  Ht 5\' 2"  (1.575 m)  Wt 116 lb (52.617 kg)  BMI 21.22 kg/m2  SpO2 100%  LMP 08/30/2011  Physical Exam  Nursing note and vitals reviewed. Constitutional: She is oriented to person, place, and time. She appears well-developed and well-nourished. No distress.  HENT:  Head: Normocephalic and atraumatic.  Eyes: Conjunctivae and EOM are normal. Pupils are equal, round, and reactive to light.  Neck: Normal range of motion.  Cardiovascular: Normal rate, regular rhythm, normal heart sounds and intact distal pulses.  Exam reveals no gallop and no friction rub.   No murmur heard. Pulmonary/Chest: Effort normal and breath sounds normal. No respiratory distress. She has no wheezes. She has no rales.  Abdominal: Soft. Bowel sounds are normal. She exhibits no distension. There is tenderness. There is no rebound and no guarding.       Tender to palpation in the epigastrium which is moderate in nature  Musculoskeletal: Normal range of motion. She exhibits no edema and no tenderness.  Neurological: She is alert and oriented to person,  place, and time. No cranial nerve deficit. She exhibits normal muscle tone. Coordination normal.  Skin: Skin is warm and dry. No rash noted.  Psychiatric: She has a normal mood and affect.    ED Course  Procedures (including critical care time)  Labs Reviewed  CBC - Abnormal; Notable for the following:    WBC 17.7 (*)    RBC 5.16 (*)    Hemoglobin 15.7 (*)    All other components within normal limits  DIFFERENTIAL - Abnormal; Notable for the following:    Neutro Abs 13.1 (*)    Monocytes Absolute 1.1 (*)    All other components within normal limits  COMPREHENSIVE METABOLIC PANEL  LIPASE, BLOOD  URINALYSIS, ROUTINE W REFLEX MICROSCOPIC  PREGNANCY, URINE   No results found.   1. Abdominal pain   2. Nausea & vomiting       MDM  Patient was evaluated and did not have an acute abdomen. Given her complaints she did have evaluation for possible UTI, pregnancy, pancreatitis, biliary process, or other cause of nausea, vomiting, and abdominal pain. She was hemodynamically stable. Patient was treated symptomatically with Zofran and IV fluids. She did later request pain medication and this was provided. Following return of lab results patient had an isolated leukocytosis with a white count of 17.7. She continued to have a non-acute abdomen on exam. Patient and I discussed this. She was given an additional dose of nausea medication as well as a GI cocktail. With this the patient felt much better. We discussed that she may have a very early gastroenteritis. Patient was comfortable with plan for discharge home. She was also given precautions for reasons to return and was discharged in good condition.        Cyndra Numbers, MD 09/06/11 [phone]

## 2011-09-06 NOTE — ED Notes (Signed)
Pt reports sudden onset of epigastric pains. Multiple episodes of vomiting. Denies diarrhea. Denies urinary symptoms.

## 2011-10-02 ENCOUNTER — Encounter (HOSPITAL_BASED_OUTPATIENT_CLINIC_OR_DEPARTMENT_OTHER): Payer: Self-pay | Admitting: *Deleted

## 2011-10-02 ENCOUNTER — Emergency Department (HOSPITAL_BASED_OUTPATIENT_CLINIC_OR_DEPARTMENT_OTHER)
Admission: EM | Admit: 2011-10-02 | Discharge: 2011-10-02 | Disposition: A | Discharge status: Home / Self Care | Payer: Self-pay | Attending: Emergency Medicine | Admitting: Emergency Medicine

## 2011-10-02 DIAGNOSIS — J069 Acute upper respiratory infection, unspecified: Secondary | ICD-10-CM | POA: Insufficient documentation

## 2011-10-02 DIAGNOSIS — R112 Nausea with vomiting, unspecified: Secondary | ICD-10-CM | POA: Insufficient documentation

## 2011-10-02 DIAGNOSIS — R197 Diarrhea, unspecified: Secondary | ICD-10-CM | POA: Insufficient documentation

## 2011-10-02 LAB — URINALYSIS, ROUTINE W REFLEX MICROSCOPIC
Hgb urine dipstick: NEGATIVE
Specific Gravity, Urine: 1.028 (ref 1.005–1.030)
Urobilinogen, UA: 0.2 mg/dL (ref 0.0–1.0)
pH: 5.5 (ref 5.0–8.0)

## 2011-10-02 MED ORDER — PROMETHAZINE HCL 25 MG RE SUPP: 25.0000 mg | Freq: Four times a day (QID) | RECTAL | Status: DC | PRN

## 2011-10-02 MED ORDER — GI COCKTAIL ~~LOC~~
ORAL | Status: AC
  Administered 2011-10-02: 30 mL via ORAL
  Filled 2011-10-02: qty 30

## 2011-10-02 MED ORDER — ONDANSETRON 8 MG PO TBDP: 8.0000 mg | ORAL_TABLET | Freq: Three times a day (TID) | ORAL | Status: AC | PRN

## 2011-10-02 MED ORDER — ONDANSETRON 8 MG PO TBDP
8.0000 mg | ORAL_TABLET | Freq: Once | ORAL | Status: AC
  Administered 2011-10-02: 8 mg via ORAL
  Filled 2011-10-02: qty 1

## 2011-10-02 MED ORDER — GI COCKTAIL ~~LOC~~
30.0000 mL | Freq: Once | ORAL | Status: AC
  Administered 2011-10-02: 30 mL via ORAL

## 2011-10-02 MED ORDER — PROCHLORPERAZINE MALEATE 10 MG PO TABS
10.0000 mg | ORAL_TABLET | Freq: Once | ORAL | Status: AC
  Administered 2011-10-02: 10 mg via ORAL
  Filled 2011-10-02: qty 1

## 2011-10-02 NOTE — ED Notes (Signed)
Pt reports no relief of nausea.

## 2011-10-02 NOTE — ED Notes (Signed)
Pt states that she has had nasal congestion and flu like sx x 2 days and woke up about an hour ago vomiting pt has vomited x 3

## 2011-10-02 NOTE — ED Provider Notes (Signed)
History     CSN: [id number]  Arrival date & time 10/02/11  0550   First MD Initiated Contact with Patient 10/02/11 [phone]      Chief Complaint  Patient presents with  . Nausea  . Emesis  . Nasal Congestion    (Consider location/radiation/quality/duration/timing/severity/associated sxs/prior treatment) Patient is a 21 y.o. female presenting with vomiting. The history is provided by the patient. No language interpreter was used.  Emesis  This is a new problem. The current episode started 1 to 2 hours ago. The problem occurs 2 to 4 times per day. The problem has not changed since onset.The emesis has an appearance of stomach contents. There has been no fever. Associated symptoms include cough, diarrhea and URI. Pertinent negatives include no chills, no fever, no headaches and no sweats. Associated symptoms comments: Nasal congestion dry cough.    Past Medical History  Diagnosis Date  . Polycystic ovary disease   . Ovarian cyst rupture     Past Surgical History  Procedure Date  . Tossilectomy     Family History  Problem Relation Age of Onset  . Diabetes Sister   . Diabetes Maternal Grandfather   . Hypertension Maternal Grandfather   . Hypertension Paternal Grandmother   . Heart attack Neg Hx     History  Substance Use Topics  . Smoking status: Never Smoker   . Smokeless tobacco: Never Used  . Alcohol Use: Yes    OB History    Grav Para Term Preterm Abortions TAB SAB Ect Mult Living                  Review of Systems  Constitutional: Negative for fever and chills.  HENT: Negative for neck stiffness.   Eyes: Negative.   Respiratory: Positive for cough.   Cardiovascular: Negative.   Gastrointestinal: Positive for vomiting and diarrhea.  Genitourinary: Negative.   Musculoskeletal: Negative.   Neurological: Negative for headaches.  Hematological: Negative.   Psychiatric/Behavioral: Negative.     Allergies  Review of patient's allergies indicates no known  allergies.  Home Medications   Current Outpatient Rx  Name Route Sig Dispense Refill  . FEXOFENADINE-PSEUDOEPHED ER 60-120 MG PO TB12 Oral Take 1 tablet by mouth every 12 (twelve) hours. 30 tablet 0  . IBUPROFEN 200 MG PO TABS Oral Take 200 mg by mouth 2 (two) times daily as needed. For pain     . JUNEL 1.5/30 PO Oral Take 1 tablet by mouth daily.       BP 104/70  Pulse 83  Temp(Src) 97.9 F (36.6 C) (Oral)  Resp 16  SpO2 100%  LMP 09/24/2011  Physical Exam  Constitutional: She is oriented to person, place, and time. She appears well-developed and well-nourished.  HENT:  Head: Normocephalic and atraumatic.  Mouth/Throat: Oropharynx is clear and moist. No oropharyngeal exudate.  Eyes: Conjunctivae are normal. Pupils are equal, round, and reactive to light.  Neck: Normal range of motion. Neck supple.  Cardiovascular: Normal rate and regular rhythm.   Pulmonary/Chest: Effort normal and breath sounds normal.  Abdominal: Soft. Bowel sounds are normal. She exhibits no distension and no mass. There is no rebound and no guarding.  Musculoskeletal: She exhibits no edema.  Lymphadenopathy:    She has no cervical adenopathy.  Neurological: She is alert and oriented to person, place, and time. She has normal reflexes.  Skin: Skin is warm and dry.  Psychiatric: Thought content normal.    ED Course  Procedures (including critical care time)  Labs Reviewed  PREGNANCY, URINE  URINALYSIS, ROUTINE W REFLEX MICROSCOPIC   No results found.   No diagnosis found.    MDM  Exam and vital signs reassuring.  No indication for imaging.  Will treat symptomatically.  Will sign out to Dr. Rosalia Hammers, if urine positive for UTi will treat.          Jasmine Awe, MD 10/02/11 [phone]

## 2012-03-15 ENCOUNTER — Emergency Department (HOSPITAL_BASED_OUTPATIENT_CLINIC_OR_DEPARTMENT_OTHER)
Admission: EM | Admit: 2012-03-15 | Discharge: 2012-03-15 | Disposition: A | Discharge status: Home / Self Care | Payer: Commercial Managed Care - PPO | Attending: Emergency Medicine | Admitting: Emergency Medicine

## 2012-03-15 ENCOUNTER — Encounter (HOSPITAL_BASED_OUTPATIENT_CLINIC_OR_DEPARTMENT_OTHER): Payer: Self-pay | Admitting: *Deleted

## 2012-03-15 DIAGNOSIS — M549 Dorsalgia, unspecified: Secondary | ICD-10-CM | POA: Insufficient documentation

## 2012-03-15 DIAGNOSIS — T148XXA Other injury of unspecified body region, initial encounter: Secondary | ICD-10-CM

## 2012-03-15 LAB — PREGNANCY, URINE: Preg Test, Ur: NEGATIVE

## 2012-03-15 LAB — URINALYSIS, ROUTINE W REFLEX MICROSCOPIC
Glucose, UA: NEGATIVE mg/dL
Leukocytes, UA: NEGATIVE
Protein, ur: NEGATIVE mg/dL
Specific Gravity, Urine: 1.022 (ref 1.005–1.030)
Urobilinogen, UA: 1 mg/dL (ref 0.0–1.0)

## 2012-03-15 MED ORDER — CYCLOBENZAPRINE HCL 10 MG PO TABS: 10.0000 mg | ORAL_TABLET | Freq: Two times a day (BID) | ORAL | Status: AC | PRN

## 2012-03-15 MED ORDER — IBUPROFEN 800 MG PO TABS: 800.0000 mg | ORAL_TABLET | Freq: Three times a day (TID) | ORAL | Status: AC

## 2012-03-15 NOTE — ED Provider Notes (Signed)
Medical screening examination/treatment/procedure(s) were performed by non-physician practitioner and as supervising physician I was immediately available for consultation/collaboration.   Gwyneth Sprout, MD 03/15/12 [phone]

## 2012-03-15 NOTE — ED Notes (Signed)
Back pain x 3 days. Feels a little like a UTI. States she is nauseated and feels dehydrated.

## 2012-03-15 NOTE — Discharge Instructions (Signed)
Muscle Strain A muscle strain (pulled muscle) happens when a muscle is over-stretched. Recovery usually takes 5 to 6 weeks.  HOME CARE   Put ice on the injured area.   Put ice in a plastic bag.   Place a towel between your skin and the bag.   Leave the ice on for 15 to 20 minutes at a time, every hour for the first 2 days.   Do not use the muscle for several days or until your doctor says you can. Do not use the muscle if you have pain.   Wrap the injured area with an elastic bandage for comfort. Do not put it on too tightly.   Only take medicine as told by your doctor.   Warm up before exercise. This helps prevent muscle strains.  GET HELP RIGHT AWAY IF:  There is increased pain or puffiness (swelling) in the affected area. MAKE SURE YOU:   Understand these instructions.   Will watch your condition.   Will get help right away if you are not doing well or get worse.  Document Released: 06/21/2008 Document Revised: 09/01/2011 Document Reviewed: 06/21/2008 [employer] Patient Information 2012 [employer]. 

## 2012-03-15 NOTE — ED Provider Notes (Signed)
History     CSN: [id number]  Arrival date & time 03/15/12  1931   First MD Initiated Contact with Patient 03/15/12 1941      Chief Complaint  Patient presents with  . Back Pain    (Consider location/radiation/quality/duration/timing/severity/associated sxs/prior treatment) Patient is a 21 y.o. female presenting with back pain. The history is provided by the patient. No language interpreter was used.  Back Pain  This is a new problem. The current episode started more than 2 days ago. The problem occurs constantly. The problem has been gradually worsening. The pain is associated with lifting heavy objects and twisting. The pain is present in the lumbar spine. The quality of the pain is described as stabbing and cramping. The pain does not radiate. The pain is moderate. The symptoms are aggravated by certain positions and bending. The pain is the same all the time. Stiffness is present all day. She has tried nothing for the symptoms. The treatment provided no relief.  Pt complains of low back pain.  Pt reports pain is worse with moving.  Pt worked in yard on Sunday before pain began.   Pt worried about uti'  Past Medical History  Diagnosis Date  . Polycystic ovary disease   . Ovarian cyst rupture     Past Surgical History  Procedure Date  . Tossilectomy   . Tonsillectomy     Family History  Problem Relation Age of Onset  . Diabetes Sister   . Diabetes Maternal Grandfather   . Hypertension Maternal Grandfather   . Hypertension Paternal Grandmother   . Heart attack Neg Hx     History  Substance Use Topics  . Smoking status: Never Smoker   . Smokeless tobacco: Never Used  . Alcohol Use: Yes    OB History    Grav Para Term Preterm Abortions TAB SAB Ect Mult Living                  Review of Systems  Musculoskeletal: Positive for back pain.  All other systems reviewed and are negative.    Allergies  Review of patient's allergies indicates no known  allergies.  Home Medications   Current Outpatient Rx  Name Route Sig Dispense Refill  . ADULT MULTIVITAMIN W/MINERALS CH Oral Take 1 tablet by mouth daily.    Colleen Can 1.5/30 PO Oral Take 1 tablet by mouth daily.       Pulse 61  Temp 98.1 F (36.7 C) (Oral)  Resp 20  SpO2 100%  LMP 02/23/2012  Physical Exam  Nursing note and vitals reviewed. Constitutional: She is oriented to person, place, and time. She appears well-developed and well-nourished.  HENT:  Head: Normocephalic.  Cardiovascular: Normal rate.   Pulmonary/Chest: Effort normal and breath sounds normal.  Abdominal: Soft. Bowel sounds are normal.  Musculoskeletal: She exhibits tenderness.       Tender low back,   From,  nv and ns intact  Neurological: She is alert and oriented to person, place, and time. She has normal reflexes.  Skin: Skin is warm and dry.  Psychiatric: She has a normal mood and affect.    ED Course  Procedures (including critical care time)   Labs Reviewed  URINALYSIS, ROUTINE W REFLEX MICROSCOPIC  PREGNANCY, URINE   No results found.   No diagnosis found.    MDM  Urine is normal   I suspect muscle spasm/ muscle strain        Lonia Skinner [place], [place] 03/15/12 2014

## 2012-05-11 ENCOUNTER — Encounter (HOSPITAL_BASED_OUTPATIENT_CLINIC_OR_DEPARTMENT_OTHER): Payer: Self-pay | Admitting: *Deleted

## 2012-05-11 ENCOUNTER — Emergency Department (HOSPITAL_BASED_OUTPATIENT_CLINIC_OR_DEPARTMENT_OTHER)
Admission: EM | Admit: 2012-05-11 | Discharge: 2012-05-11 | Disposition: A | Discharge status: Home / Self Care | Payer: Self-pay | Attending: Emergency Medicine | Admitting: Emergency Medicine

## 2012-05-11 DIAGNOSIS — B349 Viral infection, unspecified: Secondary | ICD-10-CM

## 2012-05-11 DIAGNOSIS — B9789 Other viral agents as the cause of diseases classified elsewhere: Secondary | ICD-10-CM | POA: Insufficient documentation

## 2012-05-11 DIAGNOSIS — E282 Polycystic ovarian syndrome: Secondary | ICD-10-CM | POA: Insufficient documentation

## 2012-05-11 DIAGNOSIS — R059 Cough, unspecified: Secondary | ICD-10-CM

## 2012-05-11 DIAGNOSIS — J3489 Other specified disorders of nose and nasal sinuses: Secondary | ICD-10-CM | POA: Insufficient documentation

## 2012-05-11 DIAGNOSIS — E119 Type 2 diabetes mellitus without complications: Secondary | ICD-10-CM | POA: Insufficient documentation

## 2012-05-11 DIAGNOSIS — R0981 Nasal congestion: Secondary | ICD-10-CM

## 2012-05-11 DIAGNOSIS — R05 Cough: Secondary | ICD-10-CM

## 2012-05-11 DIAGNOSIS — I10 Essential (primary) hypertension: Secondary | ICD-10-CM | POA: Insufficient documentation

## 2012-05-11 LAB — RAPID STREP SCREEN (MED CTR MEBANE ONLY): Streptococcus, Group A Screen (Direct): NEGATIVE

## 2012-05-11 LAB — CBC WITH DIFFERENTIAL/PLATELET
Basophils Absolute: 0 10*3/uL (ref 0.0–0.1)
Basophils Relative: 0 % (ref 0–1)
Eosinophils Absolute: 0.2 10*3/uL (ref 0.0–0.7)
MCH: 30.4 pg (ref 26.0–34.0)
MCHC: 35 g/dL (ref 30.0–36.0)
Neutro Abs: 10 10*3/uL — ABNORMAL HIGH (ref 1.7–7.7)
Neutrophils Relative %: 75 % (ref 43–77)
Platelets: 265 10*3/uL (ref 150–400)

## 2012-05-11 MED ORDER — BENZONATATE 100 MG PO CAPS: 100.0000 mg | ORAL_CAPSULE | Freq: Three times a day (TID) | ORAL | Status: AC | PRN

## 2012-05-11 MED ORDER — IPRATROPIUM BROMIDE 0.03 % NA SOLN: 2.0000 | Freq: Two times a day (BID) | NASAL | Status: DC

## 2012-05-11 NOTE — ED Notes (Signed)
Aching all over, cough with green sputum, headache, facial pain, and sore throat. Symptoms started 4 days ago.

## 2012-05-11 NOTE — ED Provider Notes (Signed)
History     CSN: [id number]  Arrival date & time 05/11/12  1732   First MD Initiated Contact with Patient 05/11/12 1748      Chief Complaint  Patient presents with  . URI    (Consider location/radiation/quality/duration/timing/severity/associated sxs/prior treatment) Patient is a 21 y.o. female presenting with URI. The history is provided by the patient, the spouse and medical records.  URI The primary symptoms include fever, fatigue, headaches, ear pain, cough and nausea. Primary symptoms do not include wheezing, abdominal pain, vomiting or rash.  The headache is not associated with neck stiffness.   Symptoms associated with the illness include chills and sinus pressure. The illness is not associated with congestion or rhinorrhea.   Amanda Vazquez is a 21 y.o. female presents to the emergency department complaining of cough, congestion.  The onset of the symptoms was  gradual starting 4 days ago.  The patient has associated fever, chills, headache, nasal congestion, post-nasal drip, myalgias and green sputum .  The symptoms have been  persistent, gradually worsened.  nothing makes the symptoms worse and cold medication makes symptoms better briefly.  The patient denies abdominal pain, vomiting, dizziness.  Taking Tylenol cough and cold syrup and cold and flu tablets.  These helped some until today.  She has been around two small children who have been sick.    The patient has medical history significant for:  Past Medical History  Diagnosis Date  . Polycystic ovary disease   . Ovarian cyst rupture      Past Medical History  Diagnosis Date  . Polycystic ovary disease   . Ovarian cyst rupture     Past Surgical History  Procedure Date  . Tossilectomy   . Tonsillectomy     Family History  Problem Relation Age of Onset  . Diabetes Sister   . Diabetes Maternal Grandfather   . Hypertension Maternal Grandfather   . Hypertension Paternal Grandmother   . Heart attack Neg Hx      History  Substance Use Topics  . Smoking status: Never Smoker   . Smokeless tobacco: Never Used  . Alcohol Use: Yes    OB History    Grav Para Term Preterm Abortions TAB SAB Ect Mult Living                  Review of Systems  Constitutional: Positive for fever, chills and fatigue. Negative for diaphoresis, appetite change and unexpected weight change.  HENT: Positive for ear pain and sinus pressure. Negative for congestion, rhinorrhea, mouth sores, neck pain, neck stiffness and postnasal drip. Tinnitus: R.   Eyes: Negative for visual disturbance.  Respiratory: Positive for cough. Negative for chest tightness, shortness of breath and wheezing.   Cardiovascular: Negative for chest pain.  Gastrointestinal: Positive for nausea and diarrhea. Negative for vomiting, abdominal pain and constipation.  Genitourinary: Negative for dysuria, urgency, frequency and hematuria.  Musculoskeletal: Negative for back pain.  Skin: Negative for rash.  Neurological: Positive for headaches. Negative for syncope and light-headedness.  Hematological: Does not bruise/bleed easily.  Psychiatric/Behavioral: Negative for disturbed wake/sleep cycle. The patient is not nervous/anxious.     Allergies  Review of patient's allergies indicates no known allergies.  Home Medications   Current Outpatient Rx  Name Route Sig Dispense Refill  . JUNEL 1.5/30 PO Oral Take 1 tablet by mouth daily.     Ronney Asters WARMING COUGH/CONGEST PO Oral Take 10 mLs by mouth daily as needed. For cough.    Ronney Asters  COLD PO Oral Take 2 capsules by mouth daily as needed. For cold symptoms.    Marland Kitchen BENZONATATE 100 MG PO CAPS Oral Take 1 capsule (100 mg total) by mouth 3 (three) times daily as needed for cough (cough). 20 capsule 0  . IPRATROPIUM BROMIDE 0.03 % NA SOLN Nasal Place 2 sprays into the nose 2 (two) times daily. PRN congestion 30 mL 0    BP 120/65  Pulse 80  Temp 97.5 F (36.4 C) (Oral)  Resp 20  SpO2  100%  Physical Exam  Nursing note and vitals reviewed. Constitutional: She appears well-developed and well-nourished. No distress.  HENT:  Head: Normocephalic and atraumatic.  Right Ear: External ear normal.  Left Ear: External ear normal.  Nose: No sinus tenderness. No epistaxis. Right sinus exhibits maxillary sinus tenderness and frontal sinus tenderness. Left sinus exhibits no maxillary sinus tenderness and no frontal sinus tenderness.  Mouth/Throat: Oropharynx is clear and moist. No oropharyngeal exudate.       Cerumen impaction bilaterally  Eyes: Conjunctivae are normal. Pupils are equal, round, and reactive to light. No scleral icterus.  Neck: Normal range of motion. Neck supple.  Cardiovascular: Normal rate, regular rhythm, normal heart sounds and intact distal pulses.  Exam reveals no gallop and no friction rub.   No murmur heard. Pulmonary/Chest: Effort normal and breath sounds normal. No respiratory distress. She has no wheezes.  Abdominal: Soft. Bowel sounds are normal. She exhibits no mass. There is no tenderness. There is no rebound and no guarding.  Musculoskeletal: Normal range of motion. She exhibits no edema.  Lymphadenopathy:    She has no cervical adenopathy.  Neurological: She is alert. She exhibits normal muscle tone. Coordination normal.       Speech is clear and goal oriented Moves extremities without ataxia  Skin: Skin is warm and dry. No rash noted. She is not diaphoretic.  Psychiatric: She has a normal mood and affect.    ED Course  Procedures (including critical care time)  Labs Reviewed  CBC WITH DIFFERENTIAL - Abnormal; Notable for the following:    WBC 13.3 (*)     Neutro Abs 10.0 (*)     All other components within normal limits  RAPID STREP SCREEN   No results found. Results for orders placed during the hospital encounter of 05/11/12  RAPID STREP SCREEN      Component Value Range   Streptococcus, Group A Screen (Direct) NEGATIVE  NEGATIVE   CBC WITH DIFFERENTIAL      Component Value Range   WBC 13.3 (*) 4.0 - 10.5 K/uL   RBC 4.51  3.87 - 5.11 MIL/uL   Hemoglobin 13.7  12.0 - 15.0 g/dL   HCT 16.1  09.6 - 04.5 %   MCV 86.7  78.0 - 100.0 fL   MCH 30.4  26.0 - 34.0 pg   MCHC 35.0  30.0 - 36.0 g/dL   RDW 40.9  81.1 - 91.4 %   Platelets 265  150 - 400 K/uL   Neutrophils Relative 75  43 - 77 %   Neutro Abs 10.0 (*) 1.7 - 7.7 K/uL   Lymphocytes Relative 17  12 - 46 %   Lymphs Abs 2.3  0.7 - 4.0 K/uL   Monocytes Relative 6  3 - 12 %   Monocytes Absolute 0.8  0.1 - 1.0 K/uL   Eosinophils Relative 2  0 - 5 %   Eosinophils Absolute 0.2  0.0 - 0.7 K/uL   Basophils Relative 0  0 - 1 %   Basophils Absolute 0.0  0.0 - 0.1 K/uL   No results found.    1. Cough   2. Congestion of nasal sinus   3. Viral syndrome       MDM  Amanda Vazquez presents with 4 days of URI symptoms.  She is a healthy young female, non-toxic, non-septic appearing.  Strep test negative.  CBC with slightly elevated WBC. I believe this is viral.   I will provide symptom control and have her follow up with her PCP.  I have discussed this with the patient and she is agreeable.  I have also discussed reasons to return immediately to the ER.  Patient states understanding.    1. Medications: atrovent nasal spray, tessalon perles 2. Treatment: take medications as prescribed, OTC mucinex and decongestants, rest, adequate hydration 3. Follow Up: with PCP as needed         Dierdre Forth, PA-C 05/11/12 1936

## 2012-05-12 NOTE — ED Provider Notes (Signed)
Medical screening examination/treatment/procedure(s) were performed by non-physician practitioner and as supervising physician I was immediately available for consultation/collaboration.  Anson Peddie, MD 05/12/12 2103 

## 2012-07-29 ENCOUNTER — Emergency Department (HOSPITAL_BASED_OUTPATIENT_CLINIC_OR_DEPARTMENT_OTHER)
Admission: EM | Admit: 2012-07-29 | Discharge: 2012-07-29 | Disposition: A | Discharge status: Home / Self Care | Payer: Self-pay | Attending: Emergency Medicine | Admitting: Emergency Medicine

## 2012-07-29 ENCOUNTER — Encounter (HOSPITAL_BASED_OUTPATIENT_CLINIC_OR_DEPARTMENT_OTHER): Payer: Self-pay

## 2012-07-29 DIAGNOSIS — F10929 Alcohol use, unspecified with intoxication, unspecified: Secondary | ICD-10-CM

## 2012-07-29 DIAGNOSIS — Z8639 Personal history of other endocrine, nutritional and metabolic disease: Secondary | ICD-10-CM | POA: Insufficient documentation

## 2012-07-29 DIAGNOSIS — Z79899 Other long term (current) drug therapy: Secondary | ICD-10-CM | POA: Insufficient documentation

## 2012-07-29 DIAGNOSIS — Z862 Personal history of diseases of the blood and blood-forming organs and certain disorders involving the immune mechanism: Secondary | ICD-10-CM | POA: Insufficient documentation

## 2012-07-29 DIAGNOSIS — F1010 Alcohol abuse, uncomplicated: Secondary | ICD-10-CM | POA: Insufficient documentation

## 2012-07-29 LAB — CBC WITH DIFFERENTIAL/PLATELET
Eosinophils Absolute: 0 10*3/uL (ref 0.0–0.7)
Lymphocytes Relative: 28 % (ref 12–46)
Lymphs Abs: 2 10*3/uL (ref 0.7–4.0)
Neutro Abs: 4.8 10*3/uL (ref 1.7–7.7)
Neutrophils Relative %: 66 % (ref 43–77)
Platelets: 219 10*3/uL (ref 150–400)
RBC: 4.43 MIL/uL (ref 3.87–5.11)
WBC: 7.3 10*3/uL (ref 4.0–10.5)

## 2012-07-29 LAB — BASIC METABOLIC PANEL
CO2: 24 mEq/L (ref 19–32)
Chloride: 106 mEq/L (ref 96–112)
GFR calc non Af Amer: >90 mL/min (ref >90)
Glucose, Bld: 83 mg/dL (ref 70–99)
Potassium: 3.5 mEq/L (ref 3.5–5.1)
Sodium: 144 mEq/L (ref 135–145)

## 2012-07-29 LAB — RAPID URINE DRUG SCREEN, HOSP PERFORMED
Cocaine: NOT DETECTED
Opiates: NOT DETECTED
Tetrahydrocannabinol: NOT DETECTED

## 2012-07-29 MED ORDER — SODIUM CHLORIDE 0.9 % IV BOLUS (SEPSIS)
2000.0000 mL | Freq: Once | INTRAVENOUS | Status: AC
  Administered 2012-07-29: 2000 mL via INTRAVENOUS

## 2012-07-29 NOTE — ED Notes (Signed)
Pt arousal to verbal stimuli.  No obvious signs of trauma.

## 2012-07-29 NOTE — ED Provider Notes (Signed)
History     CSN: [id number]  Arrival date & time 07/29/12  4098   First MD Initiated Contact with Patient 07/29/12 0341      Chief Complaint  Patient presents with  . Alcohol Intoxication    (Consider location/radiation/quality/duration/timing/severity/associated sxs/prior treatment) Patient is a 21 y.o. female presenting with intoxication. The history is provided by the EMS personnel. The history is limited by the condition of the patient. No language interpreter was used.  Alcohol Intoxication This is a new problem. The current episode started 3 to 5 hours ago. The problem occurs constantly. The problem has not changed since onset.Nothing aggravates the symptoms. Nothing relieves the symptoms. She has tried nothing for the symptoms. The treatment provided no relief.  Was at a bar and was reportedly doing shots and then become more intoxicated and EMS called by friends.  No trauma.    Past Medical History  Diagnosis Date  . Polycystic ovary disease   . Ovarian cyst rupture     Past Surgical History  Procedure Date  . Tossilectomy   . Tonsillectomy     Family History  Problem Relation Age of Onset  . Diabetes Sister   . Diabetes Maternal Grandfather   . Hypertension Maternal Grandfather   . Hypertension Paternal Grandmother   . Heart attack Neg Hx     History  Substance Use Topics  . Smoking status: Never Smoker   . Smokeless tobacco: Never Used  . Alcohol Use: Yes    OB History    Grav Para Term Preterm Abortions TAB SAB Ect Mult Living                  Review of Systems  Unable to perform ROS   Allergies  Review of patient's allergies indicates no known allergies.  Home Medications   Current Outpatient Rx  Name  Route  Sig  Dispense  Refill  . JUNEL 1.5/30 PO   Oral   Take 1 tablet by mouth daily.          . IPRATROPIUM BROMIDE 0.03 % NA SOLN   Nasal   Place 2 sprays into the nose 2 (two) times daily. PRN congestion   30 mL   0   .  TYLENOL WARMING COUGH/CONGEST PO   Oral   Take 10 mLs by mouth daily as needed. For cough.         Ronney Asters COLD PO   Oral   Take 2 capsules by mouth daily as needed. For cold symptoms.           BP 124/73  Pulse 120  Temp 97.4 F (36.3 C)  Resp 14  SpO2 100%  Physical Exam  Constitutional: She appears well-developed and well-nourished.  HENT:  Head: Normocephalic and atraumatic. Head is without raccoon's eyes and without Battle's sign.  Right Ear: External ear normal. No mastoid tenderness. No hemotympanum.  Left Ear: External ear normal. No mastoid tenderness. No hemotympanum.  Mouth/Throat: Oropharynx is clear and moist.  Eyes: Conjunctivae normal are normal. Pupils are equal, round, and reactive to light.  Neck: Normal range of motion. Neck supple.  Cardiovascular: Normal rate, regular rhythm and intact distal pulses.   Pulmonary/Chest: Effort normal and breath sounds normal. No stridor. She has no wheezes. She has no rales.  Abdominal: Soft. Bowel sounds are normal. There is no tenderness. There is no rebound and no guarding.  Musculoskeletal: Normal range of motion.  Neurological: She has normal reflexes.  Skin: Skin  is warm and dry.    ED Course  Procedures (including critical care time)   Labs Reviewed  CBC WITH DIFFERENTIAL  BASIC METABOLIC PANEL  PREGNANCY, URINE  URINE RAPID DRUG SCREEN (HOSP PERFORMED)  ETHANOL   No results found.   No diagnosis found.    MDM  Now awake and alert.  Was drinking tonight with friends at a bar and not eating.         Jasmine Awe, MD 07/29/12 [phone]

## 2012-07-29 NOTE — ED Notes (Signed)
Patient arrived by [facility] after being picked up at [facility] after friends report altered LOC due to much ETOH use. Patient arrived and will respond to verbal stimulation. Denies drug use. Complains of nausea, no active vomiting on arrival

## 2012-07-29 NOTE — ED Notes (Signed)
Pt contacted her mother via telephone.

## 2013-06-21 ENCOUNTER — Emergency Department (HOSPITAL_BASED_OUTPATIENT_CLINIC_OR_DEPARTMENT_OTHER)
Admission: EM | Admit: 2013-06-21 | Discharge: 2013-06-21 | Disposition: A | Discharge status: Other Acute Inpatient Hospital | Payer: Commercial Managed Care - PPO | Attending: Emergency Medicine | Admitting: Emergency Medicine

## 2013-06-21 ENCOUNTER — Encounter (HOSPITAL_BASED_OUTPATIENT_CLINIC_OR_DEPARTMENT_OTHER): Payer: Self-pay | Admitting: Emergency Medicine

## 2013-06-21 DIAGNOSIS — Z8742 Personal history of other diseases of the female genital tract: Secondary | ICD-10-CM | POA: Insufficient documentation

## 2013-06-21 DIAGNOSIS — R110 Nausea: Secondary | ICD-10-CM | POA: Insufficient documentation

## 2013-06-21 DIAGNOSIS — Z3202 Encounter for pregnancy test, result negative: Secondary | ICD-10-CM | POA: Insufficient documentation

## 2013-06-21 DIAGNOSIS — R109 Unspecified abdominal pain: Secondary | ICD-10-CM | POA: Insufficient documentation

## 2013-06-21 LAB — CBC WITH DIFFERENTIAL/PLATELET
Basophils Absolute: 0 10*3/uL (ref 0.0–0.1)
Basophils Relative: 0 % (ref 0–1)
Eosinophils Absolute: 0.2 10*3/uL (ref 0.0–0.7)
HCT: 39.5 % (ref 36.0–46.0)
Lymphocytes Relative: 31 % (ref 12–46)
MCH: 32.4 pg (ref 26.0–34.0)
MCHC: 35.4 g/dL (ref 30.0–36.0)
Monocytes Absolute: 0.8 10*3/uL (ref 0.1–1.0)
Monocytes Relative: 8 % (ref 3–12)
Neutro Abs: 6.1 10*3/uL (ref 1.7–7.7)
Neutrophils Relative %: 60 % (ref 43–77)
Platelets: 244 10*3/uL (ref 150–400)
RDW: 11.5 % (ref 11.5–15.5)
WBC: 10.2 10*3/uL (ref 4.0–10.5)

## 2013-06-21 LAB — URINALYSIS, ROUTINE W REFLEX MICROSCOPIC
Glucose, UA: NEGATIVE mg/dL
Hgb urine dipstick: NEGATIVE
Protein, ur: NEGATIVE mg/dL

## 2013-06-21 LAB — BASIC METABOLIC PANEL
BUN: 14 mg/dL (ref 6–23)
Chloride: 103 mEq/L (ref 96–112)
Creatinine, Ser: 0.8 mg/dL (ref 0.50–1.10)
GFR calc Af Amer: >90 mL/min (ref >90)
GFR calc non Af Amer: >90 mL/min (ref >90)
Potassium: 3.7 mEq/L (ref 3.5–5.1)
Sodium: 138 mEq/L (ref 135–145)

## 2013-06-21 LAB — WET PREP, GENITAL
Trich, Wet Prep: NONE SEEN
Yeast Wet Prep HPF POC: NONE SEEN

## 2013-06-21 LAB — URINE MICROSCOPIC-ADD ON

## 2013-06-21 LAB — CG4 I-STAT (LACTIC ACID): Lactic Acid, Venous: 0.52 mmol/L (ref 0.5–2.2)

## 2013-06-21 LAB — PREGNANCY, URINE: Preg Test, Ur: NEGATIVE

## 2013-06-21 MED ORDER — MORPHINE SULFATE 4 MG/ML IJ SOLN
4.0000 mg | Freq: Once | INTRAMUSCULAR | Status: AC
  Administered 2013-06-21: 4 mg via INTRAVENOUS
  Filled 2013-06-21: qty 1

## 2013-06-21 MED ORDER — SODIUM CHLORIDE 0.9 % IV BOLUS (SEPSIS)
1000.0000 mL | Freq: Once | INTRAVENOUS | Status: AC
  Administered 2013-06-21: 1000 mL via INTRAVENOUS

## 2013-06-21 MED ORDER — ONDANSETRON HCL 4 MG/2ML IJ SOLN
4.0000 mg | Freq: Once | INTRAMUSCULAR | Status: AC
  Administered 2013-06-21: 4 mg via INTRAVENOUS
  Filled 2013-06-21: qty 2

## 2013-06-21 NOTE — ED Provider Notes (Addendum)
CSN: [id number]     Arrival date & time 06/21/13  0229 History   First MD Initiated Contact with Patient 06/21/13 0309     Chief Complaint  Patient presents with  . Abdominal Pain  . Flank Pain   (Consider location/radiation/quality/duration/timing/severity/associated sxs/prior Treatment) HPI Comments: G1P0 woman comes in with cc of flank pain and abdp ain. Pain started 2 days ago, mild, but last evening got sever. She has hx of ovarian cyst, and the current pain feels like when her cyst ruptured. Pain is constant, with associated nausea, no emesis. She has no hx of STD, no risk factors for the same at this time. She has no vaginal discharge, bleeding and Upreg is negative. Pt has no hx of renal stones, no trauma.  Patient is a 22 y.o. female presenting with abdominal pain and flank pain. The history is provided by the patient.  Abdominal Pain Associated symptoms: nausea   Associated symptoms: no chest pain, no dysuria, no shortness of breath and no vomiting   Flank Pain Associated symptoms include abdominal pain. Pertinent negatives include no chest pain, no headaches and no shortness of breath.    Past Medical History  Diagnosis Date  . Polycystic ovary disease   . Ovarian cyst rupture    Past Surgical History  Procedure Laterality Date  . Tossilectomy    . Tonsillectomy     Family History  Problem Relation Age of Onset  . Diabetes Sister   . Diabetes Maternal Grandfather   . Hypertension Maternal Grandfather   . Hypertension Paternal Grandmother   . Heart attack Neg Hx    History  Substance Use Topics  . Smoking status: Never Smoker   . Smokeless tobacco: Never Used  . Alcohol Use: Yes   OB History   Grav Para Term Preterm Abortions TAB SAB Ect Mult Living                 Review of Systems  Constitutional: Positive for activity change.  HENT: Negative for neck pain.   Respiratory: Negative for shortness of breath.   Cardiovascular: Negative for chest pain.   Gastrointestinal: Positive for nausea and abdominal pain. Negative for vomiting.  Genitourinary: Positive for flank pain. Negative for dysuria.  Neurological: Negative for headaches.    Allergies  Review of patient's allergies indicates no known allergies.  Home Medications   Current Outpatient Rx  Name  Route  Sig  Dispense  Refill  . EXPIRED: ipratropium (ATROVENT) 0.03 % nasal spray   Nasal   Place 2 sprays into the nose 2 (two) times daily. PRN congestion   30 mL   0   . Norethindrone Acet-Ethinyl Est (JUNEL 1.5/30 PO)   Oral   Take 1 tablet by mouth daily.          Marland Kitchen Phenylephrine-DM-GG-APAP (TYLENOL WARMING COUGH/CONGEST PO)   Oral   Take 10 mLs by mouth daily as needed. For cough.         . Pseudoeph-CPM-DM-APAP (TYLENOL COLD PO)   Oral   Take 2 capsules by mouth daily as needed. For cold symptoms.          BP 123/69  Pulse 88  Temp(Src) 99.2 F (37.3 C) (Oral)  Resp 16  Ht 5\' 2"  (1.575 m)  Wt 120 lb (54.432 kg)  BMI 21.94 kg/m2  LMP 06/03/2013 Physical Exam  Nursing note and vitals reviewed. Constitutional: She is oriented to person, place, and time. She appears well-developed and well-nourished.  HENT:  Head: Normocephalic and atraumatic.  Eyes: Conjunctivae and EOM are normal. Pupils are equal, round, and reactive to light.  Neck: Normal range of motion. Neck supple.  Cardiovascular: Normal rate, regular rhythm, normal heart sounds and intact distal pulses.   No murmur heard. Pulmonary/Chest: Effort normal. No respiratory distress. She has no wheezes.  Abdominal: Soft. Bowel sounds are normal. She exhibits no distension. There is no tenderness. There is no rebound and no guarding.  Genitourinary: Vagina normal and uterus normal.  External exam - normal, no lesions Speculum exam: Pt has some white discharge, no blood Bimanual exam: Patient has no CMT, + right adnexal tenderness  and cervical os is closed  Neurological: She is alert and oriented  to person, place, and time.  Skin: Skin is warm and dry.    ED Course  Procedures (including critical care time) Labs Review Labs Reviewed  URINALYSIS, ROUTINE W REFLEX MICROSCOPIC - Abnormal; Notable for the following:    APPearance CLOUDY (*)    Leukocytes, UA SMALL (*)    All other components within normal limits  URINE MICROSCOPIC-ADD ON - Abnormal; Notable for the following:    Squamous Epithelial / LPF MANY (*)    Bacteria, UA FEW (*)    All other components within normal limits  URINE CULTURE  GC/CHLAMYDIA PROBE AMP  WET PREP, GENITAL  PREGNANCY, URINE  CBC WITH DIFFERENTIAL  BASIC METABOLIC PANEL  CG4 I-STAT (LACTIC ACID)   Imaging Review No results found.  MDM   1. Abdominal pain    DDx includes:  Intra abdominal abscess Thrombosis Mesenteric ischemia Diverticulitis Hernia Nephrolithiasis Pyelonephritis UTI/Cystitis Ovarian cyst TOA Ectopic pregnancy PID STD   Pt comes in with cc of abd pain. Pt has sudden onset abd pain, and flank pain. Has hx of ovarian cyst. Initial impression is pain is from cyst or torsion or PID. Pelvic exam - + right sided fullness, with CMT. She has no hx of STD and is in a monogamous relationship with her partner. Urolithiasis also considered - but no hx of it, and patient states that her current pain is simialr to her cyst rupture.  Will transfer to [facility]. Dr. Jean Rosenthal accepting. We have no [place] capacity here.  Outside of pain and nausea meds, no meds given. [facility] to assess for further etiology, including any GI concerns.      Derwood Kaplan, MD 06/21/13 1610  Derwood Kaplan, MD 06/21/13 [phone]

## 2013-06-21 NOTE — ED Notes (Signed)
LLQ pain and left flank pain intermittently x3 days.  Much worse since yesterday.  Denies any urinary sx.  BM nml for pt.

## 2013-06-22 LAB — GC/CHLAMYDIA PROBE AMP
CT Probe RNA: NEGATIVE
GC Probe RNA: NEGATIVE

## 2013-06-22 LAB — URINE CULTURE: Culture: NO GROWTH

## 2013-10-01 ENCOUNTER — Emergency Department (HOSPITAL_BASED_OUTPATIENT_CLINIC_OR_DEPARTMENT_OTHER)
Admission: EM | Admit: 2013-10-01 | Discharge: 2013-10-01 | Disposition: A | Discharge status: Home / Self Care | Payer: Managed Care, Other (non HMO) | Attending: Emergency Medicine | Admitting: Emergency Medicine

## 2013-10-01 ENCOUNTER — Emergency Department (HOSPITAL_BASED_OUTPATIENT_CLINIC_OR_DEPARTMENT_OTHER): Payer: Managed Care, Other (non HMO)

## 2013-10-01 ENCOUNTER — Encounter (HOSPITAL_BASED_OUTPATIENT_CLINIC_OR_DEPARTMENT_OTHER): Payer: Self-pay | Admitting: Emergency Medicine

## 2013-10-01 DIAGNOSIS — B9689 Other specified bacterial agents as the cause of diseases classified elsewhere: Secondary | ICD-10-CM | POA: Insufficient documentation

## 2013-10-01 DIAGNOSIS — Z8639 Personal history of other endocrine, nutritional and metabolic disease: Secondary | ICD-10-CM | POA: Insufficient documentation

## 2013-10-01 DIAGNOSIS — Z862 Personal history of diseases of the blood and blood-forming organs and certain disorders involving the immune mechanism: Secondary | ICD-10-CM | POA: Insufficient documentation

## 2013-10-01 DIAGNOSIS — Z79899 Other long term (current) drug therapy: Secondary | ICD-10-CM | POA: Insufficient documentation

## 2013-10-01 DIAGNOSIS — A499 Bacterial infection, unspecified: Secondary | ICD-10-CM | POA: Insufficient documentation

## 2013-10-01 DIAGNOSIS — N760 Acute vaginitis: Secondary | ICD-10-CM | POA: Insufficient documentation

## 2013-10-01 DIAGNOSIS — Z3202 Encounter for pregnancy test, result negative: Secondary | ICD-10-CM | POA: Insufficient documentation

## 2013-10-01 LAB — URINALYSIS, ROUTINE W REFLEX MICROSCOPIC
BILIRUBIN URINE: NEGATIVE
Glucose, UA: NEGATIVE mg/dL
HGB URINE DIPSTICK: NEGATIVE
Ketones, ur: NEGATIVE mg/dL
LEUKOCYTES UA: NEGATIVE
NITRITE: NEGATIVE
Protein, ur: NEGATIVE mg/dL
Specific Gravity, Urine: 1.025 (ref 1.005–1.030)
Urobilinogen, UA: 0.2 mg/dL (ref 0.0–1.0)
pH: 6.5 (ref 5.0–8.0)

## 2013-10-01 LAB — WET PREP, GENITAL
TRICH WET PREP: NONE SEEN
YEAST WET PREP: NONE SEEN

## 2013-10-01 LAB — PREGNANCY, URINE: Preg Test, Ur: NEGATIVE

## 2013-10-01 MED ORDER — HYDROCODONE-ACETAMINOPHEN 5-325 MG PO TABS: 2.0000 | ORAL_TABLET | ORAL | Status: DC | PRN

## 2013-10-01 MED ORDER — HYDROCODONE-ACETAMINOPHEN 5-325 MG PO TABS
2.0000 | ORAL_TABLET | Freq: Once | ORAL | Status: AC
  Administered 2013-10-01: 2 via ORAL
  Filled 2013-10-01: qty 2

## 2013-10-01 MED ORDER — METRONIDAZOLE 500 MG PO TABS: 500.0000 mg | ORAL_TABLET | Freq: Two times a day (BID) | ORAL | Status: DC

## 2013-10-01 NOTE — ED Provider Notes (Signed)
CSN: [id number]     Arrival date & time 10/01/13  1109 History   First MD Initiated Contact with Patient 10/01/13 1454     Chief Complaint  Patient presents with  . Abdominal Pain   (Consider location/radiation/quality/duration/timing/severity/associated sxs/prior Treatment) Patient is a 23 y.o. female presenting with abdominal pain. The history is provided by the patient. No language interpreter was used.  Abdominal Pain Pain location:  Generalized Pain quality: aching   Pain radiates to:  Does not radiate Onset quality:  Gradual Timing:  Constant Progression:  Worsening Chronicity:  New Relieved by:  Nothing Worsened by:  Nothing tried Ineffective treatments:  None tried Associated symptoms: vaginal discharge   Associated symptoms: no anorexia and no nausea     Past Medical History  Diagnosis Date  . Polycystic ovary disease   . Ovarian cyst rupture    Past Surgical History  Procedure Laterality Date  . Tossilectomy    . Tonsillectomy     Family History  Problem Relation Age of Onset  . Diabetes Sister   . Diabetes Maternal Grandfather   . Hypertension Maternal Grandfather   . Hypertension Paternal Grandmother   . Heart attack Neg Hx    History  Substance Use Topics  . Smoking status: Never Smoker   . Smokeless tobacco: Never Used  . Alcohol Use: Yes   OB History   Grav Para Term Preterm Abortions TAB SAB Ect Mult Living                 Review of Systems  Gastrointestinal: Positive for abdominal pain. Negative for nausea and anorexia.  Genitourinary: Positive for vaginal discharge.  All other systems reviewed and are negative.    Allergies  Review of patient's allergies indicates no known allergies.  Home Medications   Current Outpatient Rx  Name  Route  Sig  Dispense  Refill  . EXPIRED: ipratropium (ATROVENT) 0.03 % nasal spray   Nasal   Place 2 sprays into the nose 2 (two) times daily. PRN congestion   30 mL   0   . Norethindrone  Acet-Ethinyl Est (JUNEL 1.5/30 PO)   Oral   Take 1 tablet by mouth daily.          Marland Kitchen. Phenylephrine-DM-GG-APAP (TYLENOL WARMING COUGH/CONGEST PO)   Oral   Take 10 mLs by mouth daily as needed. For cough.         . Pseudoeph-CPM-DM-APAP (TYLENOL COLD PO)   Oral   Take 2 capsules by mouth daily as needed. For cold symptoms.          BP 111/68  Pulse 84  Temp(Src) 98.5 F (36.9 C) (Oral)  Resp 18  Ht 5\' 2"  (1.575 m)  Wt 120 lb (54.432 kg)  BMI 21.94 kg/m2  SpO2 100%  LMP 09/17/2013 Physical Exam  Nursing note and vitals reviewed. Constitutional: She is oriented to person, place, and time. She appears well-developed and well-nourished.  HENT:  Head: Normocephalic and atraumatic.  Eyes: Conjunctivae and EOM are normal. Pupils are equal, round, and reactive to light.  Neck: Normal range of motion. Neck supple.  Cardiovascular: Normal rate.   Pulmonary/Chest: Effort normal.  Abdominal: Soft. She exhibits no distension. There is no tenderness.  Genitourinary: Vaginal discharge found.  Vaginal discharge,  Thick white,  Adnexa no masses,  Cervix nontender  Musculoskeletal: Normal range of motion.  Neurological: She is alert and oriented to person, place, and time.  Skin: Skin is warm.  Psychiatric: She has a normal  mood and affect.    ED Course  Procedures (including critical care time) Labs Review Labs Reviewed  URINALYSIS, ROUTINE W REFLEX MICROSCOPIC  PREGNANCY, URINE   Imaging Review No results found.  EKG Interpretation   None       MDM   1. BV (bacterial vaginosis)    Ultrasound reviewed,  No cyst,   Small amount of fluid,   wer prep shows bv.   Pt given rx for flagyl and hydrocodone    Elson Areas, PA-C 10/02/13 1309  Lonia Skinner [place], [place] 10/07/13 1539

## 2013-10-01 NOTE — ED Notes (Signed)
Pt c/o abdominal pain radiating to right groin and into right low back. Pt reports h/o ovarian cyst ruptures.

## 2013-10-01 NOTE — Discharge Instructions (Signed)
Bacterial Vaginosis Bacterial vaginosis (BV) is a vaginal infection where the normal balance of bacteria in the vagina is disrupted. The normal balance is then replaced by an overgrowth of certain bacteria. There are several different kinds of bacteria that can cause BV. BV is the most common vaginal infection in women of childbearing age. CAUSES   The cause of BV is not fully understood. BV develops when there is an increase or imbalance of harmful bacteria.  Some activities or behaviors can upset the normal balance of bacteria in the vagina and put women at increased risk including:  Having a new sex partner or multiple sex partners.  Douching.  Using an intrauterine device (IUD) for contraception.  It is not clear what role sexual activity plays in the development of BV. However, women that have never had sexual intercourse are rarely infected with BV. Women do not get BV from toilet seats, bedding, swimming pools or from touching objects around them.  SYMPTOMS   Grey vaginal discharge.  A fish-like odor with discharge, especially after sexual intercourse.  Itching or burning of the vagina and vulva.  Burning or pain with urination.  Some women have no signs or symptoms at all. DIAGNOSIS  Your caregiver must examine the vagina for signs of BV. Your caregiver will perform lab tests and look at the sample of vaginal fluid through a microscope. They will look for bacteria and abnormal cells (clue cells), a pH test higher than 4.5, and a positive amine test all associated with BV.  RISKS AND COMPLICATIONS   Pelvic inflammatory disease (PID).  Infections following gynecology surgery.  Developing HIV.  Developing herpes virus. TREATMENT  Sometimes BV will clear up without treatment. However, all women with symptoms of BV should be treated to avoid complications, especially if gynecology surgery is planned. Female partners generally do not need to be treated. However, BV may spread  between female sex partners so treatment is helpful in preventing a recurrence of BV.   BV may be treated with antibiotics. The antibiotics come in either pill or vaginal cream forms. Either can be used with nonpregnant or pregnant women, but the recommended dosages differ. These antibiotics are not harmful to the baby.  BV can recur after treatment. If this happens, a second round of antibiotics will often be prescribed.  Treatment is important for pregnant women. If not treated, BV can cause a premature delivery, especially for a pregnant woman who had a premature birth in the past. All pregnant women who have symptoms of BV should be checked and treated.  For chronic reoccurrence of BV, treatment with a type of prescribed gel vaginally twice a week is helpful. HOME CARE INSTRUCTIONS   Finish all medication as directed by your caregiver.  Do not have sex until treatment is completed.  Tell your sexual partner that you have a vaginal infection. They should see their caregiver and be treated if they have problems, such as a mild rash or itching.  Practice safe sex. Use condoms. Only have 1 sex partner. PREVENTION  Basic prevention steps can help reduce the risk of upsetting the natural balance of bacteria in the vagina and developing BV:  Do not have sexual intercourse (be abstinent).  Do not douche.  Use all of the medicine prescribed for treatment of BV, even if the signs and symptoms go away.  Tell your sex partner if you have BV. That way, they can be treated, if needed, to prevent reoccurrence. SEEK MEDICAL CARE IF:     Your symptoms are not improving after 3 days of treatment.  You have increased discharge, pain, or fever. MAKE SURE YOU:   Understand these instructions.  Will watch your condition.  Will get help right away if you are not doing well or get worse. FOR MORE INFORMATION  Division of STD Prevention (DSTDP), Centers for Disease Control and Prevention:  www.cdc.gov/std American Social Health Association (ASHA): www.ashastd.org  Document Released: 09/12/2005 Document Revised: 12/05/2011 Document Reviewed: 04/24/2013 Exit[employer] Patient Information 2014 [employer].  

## 2013-10-02 LAB — GC/CHLAMYDIA PROBE AMP
CT Probe RNA: NEGATIVE
GC Probe RNA: NEGATIVE

## 2013-10-07 NOTE — ED Provider Notes (Signed)
Medical screening examination/treatment/procedure(s) were performed by non-physician practitioner and as supervising physician I was immediately available for consultation/collaboration.    Nelia Shiobert L Shanielle Correll, MD 10/07/13 [phone]

## 2013-12-04 ENCOUNTER — Emergency Department (HOSPITAL_BASED_OUTPATIENT_CLINIC_OR_DEPARTMENT_OTHER): Payer: Managed Care, Other (non HMO)

## 2013-12-04 ENCOUNTER — Encounter (HOSPITAL_BASED_OUTPATIENT_CLINIC_OR_DEPARTMENT_OTHER): Payer: Self-pay | Admitting: Emergency Medicine

## 2013-12-04 ENCOUNTER — Emergency Department (HOSPITAL_BASED_OUTPATIENT_CLINIC_OR_DEPARTMENT_OTHER)
Admission: EM | Admit: 2013-12-04 | Discharge: 2013-12-04 | Disposition: A | Discharge status: Home / Self Care | Payer: Managed Care, Other (non HMO) | Attending: Emergency Medicine | Admitting: Emergency Medicine

## 2013-12-04 DIAGNOSIS — N83209 Unspecified ovarian cyst, unspecified side: Secondary | ICD-10-CM

## 2013-12-04 DIAGNOSIS — Z3202 Encounter for pregnancy test, result negative: Secondary | ICD-10-CM | POA: Insufficient documentation

## 2013-12-04 DIAGNOSIS — R109 Unspecified abdominal pain: Secondary | ICD-10-CM

## 2013-12-04 DIAGNOSIS — Z79899 Other long term (current) drug therapy: Secondary | ICD-10-CM | POA: Insufficient documentation

## 2013-12-04 LAB — URINALYSIS, ROUTINE W REFLEX MICROSCOPIC
BILIRUBIN URINE: NEGATIVE
GLUCOSE, UA: NEGATIVE mg/dL
HGB URINE DIPSTICK: NEGATIVE
Ketones, ur: NEGATIVE mg/dL
Leukocytes, UA: NEGATIVE
Nitrite: NEGATIVE
Protein, ur: NEGATIVE mg/dL
SPECIFIC GRAVITY, URINE: 1.034 — AB (ref 1.005–1.030)
Urobilinogen, UA: 1 mg/dL (ref 0.0–1.0)
pH: 6 (ref 5.0–8.0)

## 2013-12-04 LAB — WET PREP, GENITAL: Trich, Wet Prep: NONE SEEN

## 2013-12-04 LAB — PREGNANCY, URINE: Preg Test, Ur: NEGATIVE

## 2013-12-04 MED ORDER — IBUPROFEN 600 MG PO TABS: 600.0000 mg | ORAL_TABLET | Freq: Four times a day (QID) | ORAL | Status: DC | PRN

## 2013-12-04 MED ORDER — PROMETHAZINE HCL 25 MG PO TABS: 25.0000 mg | ORAL_TABLET | Freq: Four times a day (QID) | ORAL | Status: DC | PRN

## 2013-12-04 MED ORDER — HYDROCODONE-ACETAMINOPHEN 5-325 MG PO TABS
2.0000 | ORAL_TABLET | Freq: Once | ORAL | Status: AC
  Administered 2013-12-04: 2 via ORAL
  Filled 2013-12-04: qty 2

## 2013-12-04 MED ORDER — HYDROCODONE-ACETAMINOPHEN 5-325 MG PO TABS: 1.0000 | ORAL_TABLET | ORAL | Status: DC | PRN

## 2013-12-04 NOTE — ED Notes (Signed)
Left flank pain radiating to LLQ that started last night at 0100

## 2013-12-04 NOTE — Discharge Instructions (Signed)

## 2013-12-04 NOTE — ED Provider Notes (Signed)
TIME SEEN: 8:33 AM  CHIEF COMPLAINT: L flank and lower abdominal pain  HPI: Pt is a 23 y.o. F with history of polycystic ovarian disease with an ovarian cyst rupture he presents emergency department with left flank pain that radiates into her left lower quadrant and is a sharp, constant pain without aggravating or relieving factors that started at 1 AM this morning. She states it feels different than her prior ovarian cyst. No history of kidney stones. She has had nausea but no fever, chills, vomiting or diarrhea. No bloody stool or melena. No vaginal bleeding or discharge. Last menstrual period was 2 weeks ago. She is sexually active with one partner.  ROS: See HPI Constitutional: no fever  Eyes: no drainage  ENT: no runny nose   Cardiovascular:  no chest pain  Resp: no SOB  GI: no vomiting GU: no dysuria Integumentary: no rash  Allergy: no hives  Musculoskeletal: no leg swelling  Neurological: no slurred speech ROS otherwise negative  PAST MEDICAL HISTORY/PAST SURGICAL HISTORY:  Past Medical History  Diagnosis Date  . Polycystic ovary disease   . Ovarian cyst rupture     MEDICATIONS:  Prior to Admission medications   Medication Sig Start Date End Date Taking? Authorizing Provider  HYDROcodone-acetaminophen (NORCO/VICODIN) 5-325 MG per tablet Take 2 tablets by mouth every 4 (four) hours as needed. 10/01/13   Elson AreasLeslie K Sofia, PA-C  ipratropium (ATROVENT) 0.03 % nasal spray Place 2 sprays into the nose 2 (two) times daily. PRN congestion 05/11/12 05/11/13  Hannah Muthersbaugh, PA-C  metroNIDAZOLE (FLAGYL) 500 MG tablet Take 1 tablet (500 mg total) by mouth 2 (two) times daily. 10/01/13   Elson AreasLeslie K Sofia, PA-C  Norethindrone Acet-Ethinyl Est (JUNEL 1.5/30 PO) Take 1 tablet by mouth daily.     Historical Provider, MD  Phenylephrine-DM-GG-APAP (TYLENOL WARMING COUGH/CONGEST PO) Take 10 mLs by mouth daily as needed. For cough.    Historical Provider, MD  Pseudoeph-CPM-DM-APAP (TYLENOL COLD PO)  Take 2 capsules by mouth daily as needed. For cold symptoms.    Historical Provider, MD    ALLERGIES:  No Known Allergies  SOCIAL HISTORY:  History  Substance Use Topics  . Smoking status: Never Smoker   . Smokeless tobacco: Never Used  . Alcohol Use: Yes     Comment: occasional    FAMILY HISTORY: Family History  Problem Relation Age of Onset  . Diabetes Sister   . Diabetes Maternal Grandfather   . Hypertension Maternal Grandfather   . Hypertension Paternal Grandmother   . Heart attack Neg Hx     EXAM: BP 110/64  Pulse 88  Temp(Src) 98.6 F (37 C) (Oral)  Resp 16  SpO2 100%  LMP 11/13/2013 CONSTITUTIONAL: Alert and oriented and responds appropriately to questions. Well-appearing; well-nourished, no apparent distress HEAD: Normocephalic EYES: Conjunctivae clear, PERRL ENT: normal nose; no rhinorrhea; moist mucous membranes; pharynx without lesions noted NECK: Supple, no meningismus, no LAD  CARD: RRR; S1 and S2 appreciated; no murmurs, no clicks, no rubs, no gallops RESP: Normal chest excursion without splinting or tachypnea; breath sounds clear and equal bilaterally; no wheezes, no rhonchi, no rales,  ABD/GI: Normal bowel sounds; non-distended; soft, mild tenderness to palpation in the left lower quadrant without guarding or rebound GU:  Normal external genitalia, no vaginal bleeding or discharge, no right adnexal tenderness or fullness, no cervical motion tenderness, patient has left adnexal tenderness without fullness BACK:  The back is tender to palpation in her left lower paraspinal musculature, no CVA tenderness,  no midline spinal tenderness or step-off or deformity EXT: Normal ROM in all joints; non-tender to palpation; no edema; normal capillary refill; no cyanosis    SKIN: Normal color for age and race; warm NEURO: Moves all extremities equally PSYCH: The patient's mood and manner are appropriate. Grooming and personal hygiene are appropriate.  MEDICAL DECISION  MAKING: Patient here with left back pain radiating to her left lower quadrant. She is hemodynamically stable. Abdomen is nonsurgical. Urine shows no sign of infection or blood to suggest pyelonephritis, kidney stone. Patient is not pregnant. We'll perform pelvic exam with cultures. We'll give oral pain medication.  ED PROGRESS: Patient's pelvic exam reveals left adnexal tenderness without fullness. Likely ovarian cyst but will obtain ultrasound to rule out torsion, TOA.   9:46 AM  Pt has yeast and clue cells on her wet prep. Discussed with patient she denies that she is having any vaginal discharge, itching, burning. Given she is asymptomatic, I do not feel she needs treatment for yeast or bacterial vaginosis at this time but have discussed with patient that if she began having symptoms of vaginal discharge and vaginitis to followup with her PMD. Spoke with radiologist. Patient does have a slightly larger than normal left ovary but has normal flow. There is a corpus luteum cyst. Patient's cyst is likely the cause of her pain but per radiology when the ovary is slightly enlarged cannot rule out intermittent torsion. Patient is very well-appearing on exam with normal vital signs and benign abdominal exam and pelvic exam. At this time I am not concerned that she is having intermittent torsion but have discussed with her at length return precautions. Will discharge with pain medication and nausea medicine. Patient states she is comfortable with this plan and understand to return precautions. She is comfortable with being discharged home. She states she does have an outpatient followup.  Amanda Maw Sherrelle Prochazka, DO 12/04/13 [phone]

## 2013-12-04 NOTE — ED Notes (Signed)
MD at bedside. 

## 2013-12-05 LAB — GC/CHLAMYDIA PROBE AMP
CT PROBE, AMP APTIMA: NEGATIVE
GC Probe RNA: NEGATIVE

## 2013-12-23 ENCOUNTER — Emergency Department (HOSPITAL_BASED_OUTPATIENT_CLINIC_OR_DEPARTMENT_OTHER)
Admission: EM | Admit: 2013-12-23 | Discharge: 2013-12-23 | Disposition: A | Discharge status: Home / Self Care | Payer: Managed Care, Other (non HMO) | Attending: Emergency Medicine | Admitting: Emergency Medicine

## 2013-12-23 ENCOUNTER — Encounter (HOSPITAL_BASED_OUTPATIENT_CLINIC_OR_DEPARTMENT_OTHER): Payer: Self-pay | Admitting: Emergency Medicine

## 2013-12-23 DIAGNOSIS — J069 Acute upper respiratory infection, unspecified: Secondary | ICD-10-CM

## 2013-12-23 DIAGNOSIS — Z8742 Personal history of other diseases of the female genital tract: Secondary | ICD-10-CM | POA: Insufficient documentation

## 2013-12-23 DIAGNOSIS — Z79899 Other long term (current) drug therapy: Secondary | ICD-10-CM | POA: Insufficient documentation

## 2013-12-23 LAB — RAPID STREP SCREEN (MED CTR MEBANE ONLY): Streptococcus, Group A Screen (Direct): NEGATIVE

## 2013-12-23 MED ORDER — CETIRIZINE-PSEUDOEPHEDRINE ER 5-120 MG PO TB12: 1.0000 | ORAL_TABLET | Freq: Every day | ORAL | Status: DC

## 2013-12-23 NOTE — ED Notes (Signed)
Started with head cold developed into sore throat on friday

## 2013-12-23 NOTE — Discharge Instructions (Signed)

## 2013-12-23 NOTE — ED Provider Notes (Signed)
CSN: [id number]     Arrival date & time 12/23/13  1000 History   First MD Initiated Contact with Patient 12/23/13 1011     Chief Complaint  Patient presents with  . Sore Throat     (Consider location/radiation/quality/duration/timing/severity/associated sxs/prior Treatment) HPI Comments: Pt states that it started off as cold symptoms but today her throat is very painful and feels swollen  Patient is a 23 y.o. female presenting with pharyngitis. The history is provided by the patient. No language interpreter was used.  Sore Throat This is a new problem. The current episode started today. The problem occurs constantly. The problem has been unchanged. Associated symptoms include congestion. Pertinent negatives include no coughing or fever. The symptoms are aggravated by swallowing. Treatments tried: otc medications.    Past Medical History  Diagnosis Date  . Polycystic ovary disease   . Ovarian cyst rupture    Past Surgical History  Procedure Laterality Date  . Tossilectomy    . Tonsillectomy     Family History  Problem Relation Age of Onset  . Diabetes Sister   . Diabetes Maternal Grandfather   . Hypertension Maternal Grandfather   . Hypertension Paternal Grandmother   . Heart attack Neg Hx    History  Substance Use Topics  . Smoking status: Never Smoker   . Smokeless tobacco: Never Used  . Alcohol Use: Yes     Comment: occasional   OB History   Grav Para Term Preterm Abortions TAB SAB Ect Mult Living                 Review of Systems  Constitutional: Negative for fever.  HENT: Positive for congestion.   Respiratory: Negative for cough.   Cardiovascular: Negative.       Allergies  Review of patient's allergies indicates no known allergies.  Home Medications   Current Outpatient Rx  Name  Route  Sig  Dispense  Refill  . HYDROcodone-acetaminophen (NORCO/VICODIN) 5-325 MG per tablet   Oral   Take 2 tablets by mouth every 4 (four) hours as needed.   20  tablet   0   . HYDROcodone-acetaminophen (NORCO/VICODIN) 5-325 MG per tablet   Oral   Take 1-2 tablets by mouth every 4 (four) hours as needed.   20 tablet   0   . ibuprofen (ADVIL,MOTRIN) 600 MG tablet   Oral   Take 1 tablet (600 mg total) by mouth every 6 (six) hours as needed.   30 tablet   0   . EXPIRED: ipratropium (ATROVENT) 0.03 % nasal spray   Nasal   Place 2 sprays into the nose 2 (two) times daily. PRN congestion   30 mL   0   . metroNIDAZOLE (FLAGYL) 500 MG tablet   Oral   Take 1 tablet (500 mg total) by mouth 2 (two) times daily.   14 tablet   0   . Norethindrone Acet-Ethinyl Est (JUNEL 1.5/30 PO)   Oral   Take 1 tablet by mouth daily.          Marland Kitchen. Phenylephrine-DM-GG-APAP (TYLENOL WARMING COUGH/CONGEST PO)   Oral   Take 10 mLs by mouth daily as needed. For cough.         . EXPIRED: promethazine (PHENERGAN) 25 MG suppository   Rectal   Place 1 suppository (25 mg total) rectally every 6 (six) hours as needed for nausea.   12 each   0   . promethazine (PHENERGAN) 25 MG tablet   Oral  Take 1 tablet (25 mg total) by mouth every 6 (six) hours as needed for nausea or vomiting.   15 tablet   0   . Pseudoeph-CPM-DM-APAP (TYLENOL COLD PO)   Oral   Take 2 capsules by mouth daily as needed. For cold symptoms.          BP 111/61  Pulse 71  Temp(Src) 98.3 F (36.8 C) (Oral)  Resp 16  Ht 5\' 2"  (1.575 m)  Wt 120 lb (54.432 kg)  BMI 21.94 kg/m2  SpO2 100%  LMP 11/13/2013 Physical Exam  Nursing note and vitals reviewed. Constitutional: She appears well-developed and well-nourished.  HENT:  Right Ear: External ear normal.  Left Ear: External ear normal.  Nose: Rhinorrhea present.  Mouth/Throat: Posterior oropharyngeal erythema present.  Cardiovascular: Normal rate and regular rhythm.   Pulmonary/Chest: Effort normal and breath sounds normal.    ED Course  Procedures (including critical care time) Labs Review Labs Reviewed  RAPID STREP  SCREEN  CULTURE, GROUP A STREP   Imaging Review No results found.   EKG Interpretation None      MDM   Final diagnoses:  URI (upper respiratory infection)    Strep negative:will treat symptomatically    Teressa Lower, NP 12/23/13 1413

## 2013-12-24 NOTE — ED Provider Notes (Signed)
Medical screening examination/treatment/procedure(s) were performed by non-physician practitioner and as supervising physician I was immediately available for consultation/collaboration.   EKG Interpretation None        Shelda JakesScott W. Kambre Messner, MD 12/24/13 [phone]

## 2013-12-25 LAB — CULTURE, GROUP A STREP

## 2013-12-31 ENCOUNTER — Emergency Department (HOSPITAL_BASED_OUTPATIENT_CLINIC_OR_DEPARTMENT_OTHER)
Admission: EM | Admit: 2013-12-31 | Discharge: 2013-12-31 | Disposition: A | Discharge status: Home / Self Care | Payer: Managed Care, Other (non HMO) | Attending: Emergency Medicine | Admitting: Emergency Medicine

## 2013-12-31 ENCOUNTER — Encounter (HOSPITAL_BASED_OUTPATIENT_CLINIC_OR_DEPARTMENT_OTHER): Payer: Self-pay | Admitting: Emergency Medicine

## 2013-12-31 DIAGNOSIS — Z79899 Other long term (current) drug therapy: Secondary | ICD-10-CM | POA: Insufficient documentation

## 2013-12-31 DIAGNOSIS — Z3202 Encounter for pregnancy test, result negative: Secondary | ICD-10-CM | POA: Insufficient documentation

## 2013-12-31 DIAGNOSIS — N949 Unspecified condition associated with female genital organs and menstrual cycle: Secondary | ICD-10-CM | POA: Insufficient documentation

## 2013-12-31 DIAGNOSIS — R102 Pelvic and perineal pain: Secondary | ICD-10-CM

## 2013-12-31 LAB — URINALYSIS, ROUTINE W REFLEX MICROSCOPIC
Bilirubin Urine: NEGATIVE
GLUCOSE, UA: NEGATIVE mg/dL
HGB URINE DIPSTICK: NEGATIVE
Ketones, ur: NEGATIVE mg/dL
Leukocytes, UA: NEGATIVE
Nitrite: NEGATIVE
Protein, ur: NEGATIVE mg/dL
Specific Gravity, Urine: 1.025 (ref 1.005–1.030)
Urobilinogen, UA: 0.2 mg/dL (ref 0.0–1.0)
pH: 6.5 (ref 5.0–8.0)

## 2013-12-31 LAB — WET PREP, GENITAL
TRICH WET PREP: NONE SEEN
YEAST WET PREP: NONE SEEN

## 2013-12-31 LAB — PREGNANCY, URINE: PREG TEST UR: NEGATIVE

## 2013-12-31 LAB — RAPID HIV SCREEN (WH-MAU): Rapid HIV Screen: NONREACTIVE

## 2013-12-31 MED ORDER — IBUPROFEN 600 MG PO TABS: 600.0000 mg | ORAL_TABLET | Freq: Four times a day (QID) | ORAL | Status: DC | PRN

## 2013-12-31 MED ORDER — HYDROCODONE-ACETAMINOPHEN 5-325 MG PO TABS: 1.0000 | ORAL_TABLET | ORAL | Status: DC | PRN

## 2013-12-31 NOTE — Discharge Instructions (Signed)
Pelvic Pain, Female °Female pelvic pain can be caused by many different things and start from a variety of places. Pelvic pain refers to pain that is located in the lower half of the abdomen and between your hips. The pain may occur over a short period of time (acute) or may be reoccurring (chronic). The cause of pelvic pain may be related to disorders affecting the female reproductive organs (gynecologic), but it may also be related to the bladder, kidney stones, an intestinal complication, or muscle or skeletal problems. Getting help right away for pelvic pain is important, especially if there has been severe, sharp, or a sudden onset of unusual pain. It is also important to get help right away because some types of pelvic pain can be life threatening.  °CAUSES  °Below are only some of the causes of pelvic pain. The causes of pelvic pain can be in one of several categories.  °· Gynecologic. °· Pelvic inflammatory disease. °· Sexually transmitted infection. °· Ovarian cyst or a twisted ovarian ligament (ovarian torsion). °· Uterine lining that grows outside the uterus (endometriosis). °· Fibroids, cysts, or tumors. °· Ovulation. °· Pregnancy. °· Pregnancy that occurs outside the uterus (ectopic pregnancy). °· Miscarriage. °· Labor. °· Abruption of the placenta or ruptured uterus. °· Infection. °· Uterine infection (endometritis). °· Bladder infection. °· Diverticulitis. °· Miscarriage related to a uterine infection (septic abortion). °· Bladder. °· Inflammation of the bladder (cystitis). °· Kidney stone(s). °· Gastrointenstinal. °· Constipation. °· Diverticulitis. °· Neurologic. °· Trauma. °· Feeling pelvic pain because of mental or emotional causes (psychosomatic). °· Cancers of the bowel or pelvis. °EVALUATION  °Your caregiver will want to take a careful history of your concerns. This includes recent changes in your health, a careful gynecologic history of your periods (menses), and a sexual history. Obtaining  your family history and medical history is also important. Your caregiver may suggest a pelvic exam. A pelvic exam will help identify the location and severity of the pain. It also helps in the evaluation of which organ system may be involved. In order to identify the cause of the pelvic pain and be properly treated, your caregiver may order tests. These tests may include:  °· A pregnancy test. °· Pelvic ultrasonography. °· An X-ray exam of the abdomen. °· A urinalysis or evaluation of vaginal discharge. °· Blood tests. °HOME CARE INSTRUCTIONS  °· Only take over-the-counter or prescription medicines for pain, discomfort, or fever as directed by your caregiver.   °· Rest as directed by your caregiver.   °· Eat a balanced diet.   °· Drink enough fluids to make your urine clear or pale yellow, or as directed.   °· Avoid sexual intercourse if it causes pain.   °· Apply warm or cold compresses to the lower abdomen depending on which one helps the pain.   °· Avoid stressful situations.   °· Keep a journal of your pelvic pain. Write down when it started, where the pain is located, and if there are things that seem to be associated with the pain, such as food or your menstrual cycle. °· Follow up with your caregiver as directed.   °SEEK MEDICAL CARE IF: °· Your medicine does not help your pain. °· You have abnormal vaginal discharge. °SEEK IMMEDIATE MEDICAL CARE IF:  °· You have heavy bleeding from the vagina.   °· Your pelvic pain increases.   °· You feel lightheaded or faint.   °· You have chills.   °· You have pain with urination or blood in your urine.   °· You have uncontrolled   diarrhea or vomiting.   °· You have a fever or persistent symptoms for more than 3 days. °· You have a fever and your symptoms suddenly get worse.   °· You are being physically or sexually abused.   °MAKE SURE YOU: °· Understand these instructions. °· Will watch your condition. °· Will get help if you are not doing well or get worse. °Document  Released: 08/09/2004 Document Revised: 03/13/2012 Document Reviewed: 01/02/2012 °[employer]® Patient Information ©2014 [employer]. ° °

## 2013-12-31 NOTE — ED Notes (Signed)
Pt c/o left lower abd pain x 4 days with HX ovarian cyst

## 2014-01-01 LAB — GC/CHLAMYDIA PROBE AMP
CT Probe RNA: NEGATIVE
GC Probe RNA: NEGATIVE

## 2014-01-01 NOTE — ED Provider Notes (Signed)
CSN: [id number]     Arrival date & time 12/31/13  1359 History   First MD Initiated Contact with Patient 12/31/13 1418     Chief Complaint  Patient presents with  . Abdominal Pain     (Consider location/radiation/quality/duration/timing/severity/associated sxs/prior Treatment) Patient is a 23 y.o. female presenting with abdominal pain. The history is provided by the patient. No language interpreter was used.  Abdominal Pain Pain location:  LLQ Pain quality: cramping and sharp   Pain radiates to:  Does not radiate Associated symptoms: no chills, no dysuria, no fever, no vaginal bleeding and no vaginal discharge   Associated symptoms comment:  Recurrent LLQ abdominal pain similar to history of ovarian cysts. No fever, nausea, vomiting or diarrhea. She denies vaginal discharge or abnormal bleeding. No dysuria.   Past Medical History  Diagnosis Date  . Polycystic ovary disease   . Ovarian cyst rupture    Past Surgical History  Procedure Laterality Date  . Tossilectomy    . Tonsillectomy     Family History  Problem Relation Age of Onset  . Diabetes Sister   . Diabetes Maternal Grandfather   . Hypertension Maternal Grandfather   . Hypertension Paternal Grandmother   . Heart attack Neg Hx    History  Substance Use Topics  . Smoking status: Never Smoker   . Smokeless tobacco: Never Used  . Alcohol Use: Yes     Comment: occasional   OB History   Grav Para Term Preterm Abortions TAB SAB Ect Mult Living                 Review of Systems  Constitutional: Negative for fever and chills.  Gastrointestinal: Positive for abdominal pain.  Genitourinary: Negative.  Negative for dysuria, vaginal bleeding, vaginal discharge and menstrual problem.  Musculoskeletal: Negative.   Skin: Negative.   Neurological: Negative.       Allergies  Review of patient's allergies indicates no known allergies.  Home Medications   Current Outpatient Rx  Name  Route  Sig  Dispense  Refill  .  cetirizine-pseudoephedrine (ZYRTEC-D) 5-120 MG per tablet   Oral   Take 1 tablet by mouth daily.   30 tablet   0   . HYDROcodone-acetaminophen (NORCO/VICODIN) 5-325 MG per tablet   Oral   Take 1-2 tablets by mouth every 4 (four) hours as needed.   20 tablet   0   . HYDROcodone-acetaminophen (NORCO/VICODIN) 5-325 MG per tablet   Oral   Take 1-2 tablets by mouth every 4 (four) hours as needed.   12 tablet   0   . ibuprofen (ADVIL,MOTRIN) 600 MG tablet   Oral   Take 1 tablet (600 mg total) by mouth every 6 (six) hours as needed.   30 tablet   0   . EXPIRED: ipratropium (ATROVENT) 0.03 % nasal spray   Nasal   Place 2 sprays into the nose 2 (two) times daily. PRN congestion   30 mL   0   . metroNIDAZOLE (FLAGYL) 500 MG tablet   Oral   Take 1 tablet (500 mg total) by mouth 2 (two) times daily.   14 tablet   0   . Norethindrone Acet-Ethinyl Est (JUNEL 1.5/30 PO)   Oral   Take 1 tablet by mouth daily.          Marland Kitchen Phenylephrine-DM-GG-APAP (TYLENOL WARMING COUGH/CONGEST PO)   Oral   Take 10 mLs by mouth daily as needed. For cough.         Marland Kitchen  EXPIRED: promethazine (PHENERGAN) 25 MG suppository   Rectal   Place 1 suppository (25 mg total) rectally every 6 (six) hours as needed for nausea.   12 each   0   . promethazine (PHENERGAN) 25 MG tablet   Oral   Take 1 tablet (25 mg total) by mouth every 6 (six) hours as needed for nausea or vomiting.   15 tablet   0   . Pseudoeph-CPM-DM-APAP (TYLENOL COLD PO)   Oral   Take 2 capsules by mouth daily as needed. For cold symptoms.          BP 111/66  Pulse 81  Temp(Src) 98.6 F (37 C) (Oral)  Resp 16  Ht 5\' 2"  (1.575 m)  Wt 120 lb (54.432 kg)  BMI 21.94 kg/m2  SpO2 100%  LMP 12/11/2013 Physical Exam  Constitutional: She is oriented to person, place, and time. She appears well-developed and well-nourished. No distress.  HENT:  Mouth/Throat: Oropharynx is clear and moist.  Cardiovascular: Normal rate and regular  rhythm.   No murmur heard. Pulmonary/Chest: Effort normal and breath sounds normal. She has no wheezes. She has no rales. She exhibits no tenderness.  Abdominal: Soft. She exhibits no mass. There is tenderness. There is no rebound and no guarding.  Tenderness limited to LLQ.   Genitourinary: No vaginal discharge found.  Left adnexal tenderness without fullness or mass. No CMT or right adnexal tenderness.   Musculoskeletal: Normal range of motion.  Neurological: She is alert and oriented to person, place, and time.  Skin: Skin is warm and dry.  Psychiatric: She has a normal mood and affect.    ED Course  Procedures (including critical care time) Labs Review Labs Reviewed  WET PREP, GENITAL - Abnormal; Notable for the following:    Clue Cells Wet Prep HPF POC MODERATE (*)    WBC, Wet Prep HPF POC FEW (*)    All other components within normal limits  GC/CHLAMYDIA PROBE AMP  URINALYSIS, ROUTINE W REFLEX MICROSCOPIC  PREGNANCY, URINE  RAPID HIV SCREEN ([facility])   Imaging Review No results found.   EKG Interpretation None      MDM   Final diagnoses:  Pelvic pain    History of ovarian cysts, tonight with same pain. No concerning findings on exam - no evidence pelvic infection (no fever, cervical discharge). Will treat her pain and encourage follow up with GYN.     Arnoldo HookerShari A Xavien Dauphinais, PA-C 01/03/14 2240

## 2014-01-04 NOTE — ED Provider Notes (Signed)
Medical screening examination/treatment/procedure(s) were performed by non-physician practitioner and as supervising physician I was immediately available for consultation/collaboration.   EKG Interpretation None        Candyce ChurnJohn David Amarissa Koerner III, MD 01/04/14 [id number]

## 2014-01-27 ENCOUNTER — Ambulatory Visit (INDEPENDENT_AMBULATORY_CARE_PROVIDER_SITE_OTHER): Payer: Self-pay | Admitting: Obstetrics & Gynecology

## 2014-01-27 ENCOUNTER — Encounter: Payer: Self-pay | Admitting: Obstetrics & Gynecology

## 2014-01-27 VITALS — BP 110/77 | HR 87 | Temp 98.4°F | Ht 62.0 in | Wt 119.5 lb

## 2014-01-27 DIAGNOSIS — R102 Pelvic and perineal pain: Secondary | ICD-10-CM

## 2014-01-27 DIAGNOSIS — N946 Dysmenorrhea, unspecified: Secondary | ICD-10-CM

## 2014-01-27 DIAGNOSIS — N949 Unspecified condition associated with female genital organs and menstrual cycle: Secondary | ICD-10-CM

## 2014-01-27 MED ORDER — DICLOFENAC POTASSIUM 50 MG PO TABS: 50.0000 mg | ORAL_TABLET | Freq: Three times a day (TID) | ORAL | Status: DC

## 2014-01-27 MED ORDER — NORETHIN ACE-ETH ESTRAD-FE 1-20 MG-MCG(24) PO TABS: 1.0000 | ORAL_TABLET | Freq: Every day | ORAL | Status: DC

## 2014-01-27 NOTE — Progress Notes (Signed)
Subjective:     Patient ID: Amanda PrimasAmber Dombrosky, female   DOB: Feb 04, 1991, 23 y.o.   MRN: [id number]  HPI Pt reports a h/o multiple ovarian cysts.  She reports that she was on OCP's briefly several years ago which did not resolve the pain and caused her side effects.  She does not remember how long she took the pills. Pt denies sx currently.  She is looking ofr management options.  Review of Systems     Objective:   Physical Exam BP 110/77  Pulse 87  Temp(Src) 98.4 F (36.9 C)  Ht 5\' 2"  (1.575 m)  Wt 119 lb 8 oz (54.205 kg)  BMI 21.85 kg/m2  LMP 01/11/2014 Pt in NAD   12/04/2013 EXAM  TRANSABDOMINAL AND TRANSVAGINAL ULTRASOUND OF PELVIS  DOPPLER ULTRASOUND OF OVARIES  TECHNIQUE  Both transabdominal and transvaginal ultrasound examinations of the  pelvis were performed. Transabdominal technique was performed for  global imaging of the pelvis including uterus, ovaries, adnexal  regions, and pelvic cul-de-sac.  It was necessary to proceed with endovaginal exam following the  transabdominal exam to visualize the adnexal structures. Color and  duplex Doppler ultrasound was utilized to evaluate blood flow to the  ovaries.  COMPARISON  None.  FINDINGS  Uterus  Measurements: 7.6 x 3.7 x 4.4 cm. No fibroids or other mass  visualized. Anteverted.  Endometrium  Thickness: 13 mm. No focal abnormality visualized.  Right ovary  Measurements: 3.6 x 2.5 x 1.7 cm. Normal appearance/no adnexal mass.  Left ovary  Measurements: 5.2 x 3.1 x 3.6 cm. The left ovary is mildly enlarged.  It contains a 2.2 x 2.2 x 1.9 cm corpus luteum. Additionally there  is a 1.7 x 1.9 x 1.1 cm cyst.  Pulsed Doppler evaluation of both ovaries demonstrates normal  low-resistance arterial and venous waveforms.  Other findings  Moderate amount of free fluid.  IMPRESSION  1. Mildly enlarged left ovary containing a corpus luteum and simple  cyst. Arterial and venous waveforms are demonstrated within the left   ovary. Given the mildly enlarged size of the left ovary and multiple  cysts, intermittent torsion is not excluded as a cause of pain.  2. Moderate pelvic free fluid.  These results were called by telephone at the time of interpretation  on 12/04/2013 at 9:42 AM to Dr. Rochele RaringKRISTEN WARD , who verbally  acknowledged these results.       Assessment:     Pelvic pain- female.  I have reviewed management options for ovarian cysts (follicles).   Rec plan to limit ovulation.  Reviewed with pt and her mother that these treatments work over time and require patience and follow up to adjust plan.     Plan:     LoEstrin 1 po q day F/u 3-6 months Cataflam 1 po q 8 hours prn pain/dysmenorrhea Consider Mirena (pt to complete grant paperwork prior to leaving ofc)

## 2014-01-27 NOTE — Patient Instructions (Addendum)
Ovarian Cyst An ovarian cyst is a fluid-filled sac that forms on an ovary. The ovaries are small organs that produce eggs in women. Various types of cysts can form on the ovaries. Most are not cancerous. Many do not cause problems, and they often go away on their own. Some may cause symptoms and require treatment. Common types of ovarian cysts include:  Functional cysts These cysts may occur every month during the menstrual cycle. This is normal. The cysts usually go away with the next menstrual cycle if the woman does not get pregnant. Usually, there are no symptoms with a functional cyst.  Endometrioma cysts These cysts form from the tissue that lines the uterus. They are also called "chocolate cysts" because they become filled with blood that turns brown. This type of cyst can cause pain in the lower abdomen during intercourse and with your menstrual period.  Cystadenoma cysts This type develops from the cells on the outside of the ovary. These cysts can get very big and cause lower abdomen pain and pain with intercourse. This type of cyst can twist on itself, cut off its blood supply, and cause severe pain. It can also easily rupture and cause a lot of pain.  Dermoid cysts This type of cyst is sometimes found in both ovaries. These cysts may contain different kinds of body tissue, such as skin, teeth, hair, or cartilage. They usually do not cause symptoms unless they get very big.  Theca lutein cysts These cysts occur when too much of a certain hormone (human chorionic gonadotropin) is produced and overstimulates the ovaries to produce an egg. This is most common after procedures used to assist with the conception of a baby (in vitro fertilization). CAUSES   Fertility drugs can cause a condition in which multiple large cysts are formed on the ovaries. This is called ovarian hyperstimulation syndrome.  A condition called polycystic ovary syndrome can cause hormonal imbalances that can lead to  nonfunctional ovarian cysts. SIGNS AND SYMPTOMS  Many ovarian cysts do not cause symptoms. If symptoms are present, they may include:  Pelvic pain or pressure.  Pain in the lower abdomen.  Pain during sexual intercourse.  Increasing girth (swelling) of the abdomen.  Abnormal menstrual periods.  Increasing pain with menstrual periods.  Stopping having menstrual periods without being pregnant. DIAGNOSIS  These cysts are commonly found during a routine or annual pelvic exam. Tests may be ordered to find out more about the cyst. These tests may include:  Ultrasound.  X-ray of the pelvis.  CT scan.  MRI.  Blood tests. TREATMENT  Many ovarian cysts go away on their own without treatment. Your health care provider may want to check your cyst regularly for 2 3 months to see if it changes. For women in menopause, it is particularly important to monitor a cyst closely because of the higher rate of ovarian cancer in menopausal women. When treatment is needed, it may include any of the following:  A procedure to drain the cyst (aspiration). This may be done using a long needle and ultrasound. It can also be done through a laparoscopic procedure. This involves using a thin, lighted tube with a tiny camera on the end (laparoscope) inserted through a small incision.  Surgery to remove the whole cyst. This may be done using laparoscopic surgery or an open surgery involving a larger incision in the lower abdomen.  Hormone treatment or birth control pills. These methods are sometimes used to help dissolve a cyst. HOME CARE   INSTRUCTIONS   Only take over-the-counter or prescription medicines as directed by your health care provider.  Follow up with your health care provider as directed.  Get regular pelvic exams and Pap tests. SEEK MEDICAL CARE IF:   Your periods are late, irregular, or painful, or they stop.  Your pelvic pain or abdominal pain does not go away.  Your abdomen becomes  larger or swollen.  You have pressure on your bladder or trouble emptying your bladder completely.  You have pain during sexual intercourse.  You have feelings of fullness, pressure, or discomfort in your stomach.  You lose weight for no apparent reason.  You feel generally ill.  You become constipated.  You lose your appetite.  You develop acne.  You have an increase in body and facial hair.  You are gaining weight, without changing your exercise and eating habits.  You think you are pregnant. SEEK IMMEDIATE MEDICAL CARE IF:   You have increasing abdominal pain.  You feel sick to your stomach (nauseous), and you throw up (vomit).  You develop a fever that comes on suddenly.  You have abdominal pain during a bowel movement.  Your menstrual periods become heavier than usual. Document Released: 09/12/2005 Document Revised: 07/03/2013 Document Reviewed: 05/20/2013 [facility] Patient Information 2014 [employer], [place]. Levonorgestrel intrauterine device (IUD) What is this medicine? LEVONORGESTREL IUD (LEE voe nor jes trel) is a contraceptive (birth control) device. The device is placed inside the uterus by a healthcare professional. It is used to prevent pregnancy and can also be used to treat heavy bleeding that occurs during your period. Depending on the device, it can be used for 3 to 5 years. This medicine may be used for other purposes; ask your health care provider or pharmacist if you have questions. COMMON BRAND NAME(S): Gretta CoolMirena, Skyla What should I tell my health care provider before I take this medicine? They need to know if you have any of these conditions: -abnormal Pap smear -cancer of the breast, uterus, or cervix -diabetes -endometritis -genital or pelvic infection now or in the past -have more than one sexual partner or your partner has more than one partner -heart disease -history of an ectopic or tubal pregnancy -immune system problems -IUD in  place -liver disease or tumor -problems with blood clots or take blood-thinners -use intravenous drugs -uterus of unusual shape -vaginal bleeding that has not been explained -an unusual or allergic reaction to levonorgestrel, other hormones, silicone, or polyethylene, medicines, foods, dyes, or preservatives -pregnant or trying to get pregnant -breast-feeding How should I use this medicine? This device is placed inside the uterus by a health care professional. Talk to your pediatrician regarding the use of this medicine in children. Special care may be needed. Overdosage: If you think you have taken too much of this medicine contact a poison control center or emergency room at once. NOTE: This medicine is only for you. Do not share this medicine with others. What if I miss a dose? This does not apply. What may interact with this medicine? Do not take this medicine with any of the following medications: -amprenavir -bosentan -fosamprenavir This medicine may also interact with the following medications: -aprepitant -barbiturate medicines for inducing sleep or treating seizures -bexarotene -griseofulvin -medicines to treat seizures like carbamazepine, ethotoin, felbamate, oxcarbazepine, phenytoin, topiramate -modafinil -pioglitazone -rifabutin -rifampin -rifapentine -some medicines to treat HIV infection like atazanavir, indinavir, lopinavir, nelfinavir, tipranavir, ritonavir -St. John's wort -warfarin This list may not describe all possible interactions. Give your health care  provider a list of all the medicines, herbs, non-prescription drugs, or dietary supplements you use. Also tell them if you smoke, drink alcohol, or use illegal drugs. Some items may interact with your medicine. What should I watch for while using this medicine? Visit your doctor or health care professional for regular check ups. See your doctor if you or your partner has sexual contact with others, becomes HIV  positive, or gets a sexual transmitted disease. This product does not protect you against HIV infection (AIDS) or other sexually transmitted diseases. You can check the placement of the IUD yourself by reaching up to the top of your vagina with clean fingers to feel the threads. Do not pull on the threads. It is a good habit to check placement after each menstrual period. Call your doctor right away if you feel more of the IUD than just the threads or if you cannot feel the threads at all. The IUD may come out by itself. You may become pregnant if the device comes out. If you notice that the IUD has come out use a backup birth control method like condoms and call your health care provider. Using tampons will not change the position of the IUD and are okay to use during your period. What side effects may I notice from receiving this medicine? Side effects that you should report to your doctor or health care professional as soon as possible: -allergic reactions like skin rash, itching or hives, swelling of the face, lips, or tongue -fever, flu-like symptoms -genital sores -high blood pressure -no menstrual period for 6 weeks during use -pain, swelling, warmth in the leg -pelvic pain or tenderness -severe or sudden headache -signs of pregnancy -stomach cramping -sudden shortness of breath -trouble with balance, talking, or walking -unusual vaginal bleeding, discharge -yellowing of the eyes or skin Side effects that usually do not require medical attention (report to your doctor or health care professional if they continue or are bothersome): -acne -breast pain -change in sex drive or performance -changes in weight -cramping, dizziness, or faintness while the device is being inserted -headache -irregular menstrual bleeding within first 3 to 6 months of use -nausea This list may not describe all possible side effects. Call your doctor for medical advice about side effects. You may report  side effects to FDA at 1-800-FDA-1088. Where should I keep my medicine? This does not apply. NOTE: This sheet is a summary. It may not cover all possible information. If you have questions about this medicine, talk to your doctor, pharmacist, or health care provider.  2014, [employer] Standard. (2011-10-13 13:54:04)

## 2014-01-28 ENCOUNTER — Other Ambulatory Visit: Payer: Self-pay | Admitting: Obstetrics & Gynecology

## 2014-01-28 ENCOUNTER — Telehealth: Payer: Self-pay

## 2014-01-28 DIAGNOSIS — N946 Dysmenorrhea, unspecified: Secondary | ICD-10-CM

## 2014-01-28 DIAGNOSIS — R102 Pelvic and perineal pain: Secondary | ICD-10-CM

## 2014-01-28 MED ORDER — NORGESTIMATE-ETH ESTRADIOL 0.25-35 MG-MCG PO TABS: 1.0000 | ORAL_TABLET | Freq: Every day | ORAL | Status: DC

## 2014-01-28 MED ORDER — NAPROXEN 500 MG PO TABS: 500.0000 mg | ORAL_TABLET | Freq: Two times a day (BID) | ORAL | Status: DC | PRN

## 2014-01-28 MED ORDER — DICLOFENAC POTASSIUM 50 MG PO TABS: 50.0000 mg | ORAL_TABLET | Freq: Three times a day (TID) | ORAL | Status: DC

## 2014-01-28 NOTE — Telephone Encounter (Signed)
Pt called directly to nurse, [facility].  [facility] Birth control [facility] 9 $ and pain medication is $26.49.  Called patient and [facility]  Pt verbalizes understanding.

## 2014-01-28 NOTE — Telephone Encounter (Signed)
Pt. Called stating she was seen by Dr. Erin FullingHarraway-Smith yesterday and was [facility] and pain medication-- both of which are too expensive as she does not have insurance at this time. BC is costing $80 and pain medication $70. Would like to know if we can prescribe something else.

## 2014-02-04 ENCOUNTER — Telehealth: Payer: Self-pay | Admitting: *Deleted

## 2014-02-04 NOTE — Telephone Encounter (Signed)
Patient called nurse line stating [facility]  States that they gave us amount of $26.00 but was $98.00, [facility] that they do not know the exact price until patient arrives and they run insurance.   Message sent to Dr. Erin FullingHarraway Smith.

## 2014-02-05 NOTE — Telephone Encounter (Signed)
Discussed with Dr. Erin FullingHarraway Smith and she states pt can take Naprosyn instead of Cataflam for pain.  Called patient and spoke with her concerning taking the naprosyn instead of the Cataflam and starting the birth control pills.  Pt agrees to plan. Pt has no further questions.

## 2014-02-06 ENCOUNTER — Emergency Department (HOSPITAL_BASED_OUTPATIENT_CLINIC_OR_DEPARTMENT_OTHER)
Admission: EM | Admit: 2014-02-06 | Discharge: 2014-02-06 | Disposition: A | Discharge status: Home / Self Care | Payer: Managed Care, Other (non HMO) | Attending: Emergency Medicine | Admitting: Emergency Medicine

## 2014-02-06 ENCOUNTER — Encounter (HOSPITAL_BASED_OUTPATIENT_CLINIC_OR_DEPARTMENT_OTHER): Payer: Self-pay | Admitting: Emergency Medicine

## 2014-02-06 DIAGNOSIS — A499 Bacterial infection, unspecified: Secondary | ICD-10-CM | POA: Insufficient documentation

## 2014-02-06 DIAGNOSIS — R109 Unspecified abdominal pain: Secondary | ICD-10-CM

## 2014-02-06 DIAGNOSIS — Z79899 Other long term (current) drug therapy: Secondary | ICD-10-CM | POA: Insufficient documentation

## 2014-02-06 DIAGNOSIS — N760 Acute vaginitis: Secondary | ICD-10-CM | POA: Insufficient documentation

## 2014-02-06 DIAGNOSIS — B9689 Other specified bacterial agents as the cause of diseases classified elsewhere: Secondary | ICD-10-CM

## 2014-02-06 DIAGNOSIS — Z8742 Personal history of other diseases of the female genital tract: Secondary | ICD-10-CM | POA: Insufficient documentation

## 2014-02-06 DIAGNOSIS — Z3202 Encounter for pregnancy test, result negative: Secondary | ICD-10-CM | POA: Insufficient documentation

## 2014-02-06 LAB — WET PREP, GENITAL
TRICH WET PREP: NONE SEEN
Yeast Wet Prep HPF POC: NONE SEEN

## 2014-02-06 LAB — URINALYSIS, ROUTINE W REFLEX MICROSCOPIC
BILIRUBIN URINE: NEGATIVE
GLUCOSE, UA: NEGATIVE mg/dL
HGB URINE DIPSTICK: NEGATIVE
KETONES UR: NEGATIVE mg/dL
Leukocytes, UA: NEGATIVE
Nitrite: NEGATIVE
PH: 7 (ref 5.0–8.0)
Protein, ur: NEGATIVE mg/dL
Specific Gravity, Urine: 1.016 (ref 1.005–1.030)
Urobilinogen, UA: 0.2 mg/dL (ref 0.0–1.0)

## 2014-02-06 LAB — PREGNANCY, URINE: Preg Test, Ur: NEGATIVE

## 2014-02-06 MED ORDER — METRONIDAZOLE 500 MG PO TABS: 500.0000 mg | ORAL_TABLET | Freq: Two times a day (BID) | ORAL | Status: DC

## 2014-02-06 MED ORDER — ACETAMINOPHEN 325 MG PO TABS
650.0000 mg | ORAL_TABLET | Freq: Once | ORAL | Status: AC
  Administered 2014-02-06: 650 mg via ORAL
  Filled 2014-02-06: qty 2

## 2014-02-06 MED ORDER — HYDROCODONE-ACETAMINOPHEN 5-325 MG PO TABS: 1.0000 | ORAL_TABLET | ORAL | Status: DC | PRN

## 2014-02-06 NOTE — ED Notes (Signed)
Pt c/o left lower abd pain x 6 days. HX ovarian cyst

## 2014-02-06 NOTE — ED Provider Notes (Signed)
CSN: [id number]     Arrival date & time 02/06/14  1328 History   First MD Initiated Contact with Patient 02/06/14 1336     Chief Complaint  Patient presents with  . Abdominal Pain     (Consider location/radiation/quality/duration/timing/severity/associated sxs/prior Treatment) HPI 23 year old female presents with left lower quadrant abdominal pain over last 5-6 days. She states that it feels like her typical left ovarian cyst pain. It started off as intermittent but over the last 2-3 days his been constant. She rates as a 7/10. She's taken 800 mg ibuprofen without success. No fevers or chills. No back pain. No nausea, vomiting, or diarrhea. No hematuria, dysuria, or vaginal bleeding or discharge. [facility], and was put on oral contraceptives to help prevent this pain but seems to be going on.  Past Medical History  Diagnosis Date  . Polycystic ovary disease   . Ovarian cyst rupture    Past Surgical History  Procedure Laterality Date  . Tossilectomy    . Tonsillectomy     Family History  Problem Relation Age of Onset  . Diabetes Sister   . Diabetes Maternal Grandfather   . Hypertension Maternal Grandfather   . Hypertension Paternal Grandmother   . Heart attack Neg Hx    History  Substance Use Topics  . Smoking status: Never Smoker   . Smokeless tobacco: Never Used  . Alcohol Use: Yes     Comment: occasional   OB History   Grav Para Term Preterm Abortions TAB SAB Ect Mult Living   1    1  1         Review of Systems  Constitutional: Negative for fever.  Gastrointestinal: Positive for abdominal pain. Negative for nausea, vomiting and diarrhea.  Genitourinary: Positive for pelvic pain. Negative for dysuria, vaginal bleeding, vaginal discharge and menstrual problem.  All other systems reviewed and are negative.     Allergies  Review of patient's allergies indicates no known allergies.  Home Medications   Prior to Admission  medications   Medication Sig Start Date End Date Taking? Authorizing Provider  ibuprofen (ADVIL,MOTRIN) 600 MG tablet Take 1 tablet (600 mg total) by mouth every 6 (six) hours as needed. 12/31/13  Yes Shari A Upstill, PA-C  ipratropium (ATROVENT) 0.03 % nasal spray Place 2 sprays into the nose 2 (two) times daily. PRN congestion 05/11/12 05/11/13  Hannah Muthersbaugh, PA-C  naproxen (NAPROSYN) 500 MG tablet Take 1 tablet (500 mg total) by mouth 2 (two) times daily as needed for moderate pain. 01/28/14   Willodean Rosenthalarolyn Harraway-Smith, MD  norgestimate-ethinyl estradiol (ORTHO-CYCLEN,SPRINTEC,PREVIFEM) 0.25-35 MG-MCG tablet Take 1 tablet by mouth daily. 01/28/14   Willodean Rosenthalarolyn Harraway-Smith, MD  Phenylephrine-DM-GG-APAP (TYLENOL WARMING COUGH/CONGEST PO) Take 10 mLs by mouth daily as needed. For cough.    Historical Provider, MD  Pseudoeph-CPM-DM-APAP (TYLENOL COLD PO) Take 2 capsules by mouth daily as needed. For cold symptoms.    Historical Provider, MD   BP 110/64  Pulse 86  Temp(Src) 97.3 F (36.3 C) (Oral)  Resp 18  Ht 5\' 2"  (1.575 m)  Wt 119 lb (53.978 kg)  BMI 21.76 kg/m2  SpO2 100%  LMP 02/01/2014 Physical Exam  Nursing note and vitals reviewed. Constitutional: She is oriented to person, place, and time. She appears well-developed and well-nourished. No distress.  Comfortable appearing, looking at phone  HENT:  Head: Normocephalic and atraumatic.  Right Ear: External ear normal.  Left Ear: External ear normal.  Nose: Nose normal.  Eyes:  Right eye exhibits no discharge. Left eye exhibits no discharge.  Cardiovascular: Normal rate, regular rhythm and normal heart sounds.   Pulmonary/Chest: Effort normal and breath sounds normal.  Abdominal: Soft. She exhibits no distension. There is tenderness in the left lower quadrant.  Genitourinary: Uterus is not enlarged and not tender. Cervix exhibits no motion tenderness and no discharge. Right adnexum displays no mass and no tenderness. Left adnexum displays  tenderness. Left adnexum displays no mass. No bleeding around the vagina. No signs of injury around the vagina. Vaginal discharge (scant discharge) found.  Neurological: She is alert and oriented to person, place, and time.  Skin: Skin is warm and dry.    ED Course  Procedures (including critical care time) Labs Review Labs Reviewed  WET PREP, GENITAL - Abnormal; Notable for the following:    Clue Cells Wet Prep HPF POC MANY (*)    WBC, Wet Prep HPF POC FEW (*)    All other components within normal limits  GC/CHLAMYDIA PROBE AMP  URINALYSIS, ROUTINE W REFLEX MICROSCOPIC  PREGNANCY, URINE    Imaging Review No results found.   EKG Interpretation None      MDM   Final diagnoses:  Abdominal pain  Bacterial vaginosis    Patient's pain appears to be coming from recurrent L ovarian cyst. Given length of her symptoms and exam, I have low suspicion for torsion or TOA. I do not feel imaging would be beneficial. Will give short course of pain meds, but will need follow up with her GYN. Given her many clue cells with some discharge seen, will cover for BV with flagyl.    Audree CamelScott T Shanina Kepple, MD 02/06/14 [phone]

## 2014-02-07 LAB — GC/CHLAMYDIA PROBE AMP
CT Probe RNA: NEGATIVE
GC Probe RNA: NEGATIVE

## 2014-02-18 ENCOUNTER — Emergency Department (HOSPITAL_BASED_OUTPATIENT_CLINIC_OR_DEPARTMENT_OTHER)
Admission: EM | Admit: 2014-02-18 | Discharge: 2014-02-18 | Disposition: A | Discharge status: Home / Self Care | Payer: Managed Care, Other (non HMO) | Attending: Emergency Medicine | Admitting: Emergency Medicine

## 2014-02-18 ENCOUNTER — Encounter (HOSPITAL_BASED_OUTPATIENT_CLINIC_OR_DEPARTMENT_OTHER): Payer: Self-pay | Admitting: Emergency Medicine

## 2014-02-18 DIAGNOSIS — N83202 Unspecified ovarian cyst, left side: Secondary | ICD-10-CM

## 2014-02-18 DIAGNOSIS — Z862 Personal history of diseases of the blood and blood-forming organs and certain disorders involving the immune mechanism: Secondary | ICD-10-CM | POA: Insufficient documentation

## 2014-02-18 DIAGNOSIS — Z79899 Other long term (current) drug therapy: Secondary | ICD-10-CM | POA: Insufficient documentation

## 2014-02-18 DIAGNOSIS — N83209 Unspecified ovarian cyst, unspecified side: Secondary | ICD-10-CM | POA: Insufficient documentation

## 2014-02-18 DIAGNOSIS — Z792 Long term (current) use of antibiotics: Secondary | ICD-10-CM | POA: Insufficient documentation

## 2014-02-18 DIAGNOSIS — Z8639 Personal history of other endocrine, nutritional and metabolic disease: Secondary | ICD-10-CM | POA: Insufficient documentation

## 2014-02-18 DIAGNOSIS — N925 Other specified irregular menstruation: Secondary | ICD-10-CM | POA: Insufficient documentation

## 2014-02-18 DIAGNOSIS — Z791 Long term (current) use of non-steroidal anti-inflammatories (NSAID): Secondary | ICD-10-CM | POA: Insufficient documentation

## 2014-02-18 DIAGNOSIS — Z3202 Encounter for pregnancy test, result negative: Secondary | ICD-10-CM | POA: Insufficient documentation

## 2014-02-18 DIAGNOSIS — N938 Other specified abnormal uterine and vaginal bleeding: Secondary | ICD-10-CM

## 2014-02-18 DIAGNOSIS — N949 Unspecified condition associated with female genital organs and menstrual cycle: Secondary | ICD-10-CM | POA: Insufficient documentation

## 2014-02-18 LAB — URINE MICROSCOPIC-ADD ON

## 2014-02-18 LAB — URINALYSIS, ROUTINE W REFLEX MICROSCOPIC
Bilirubin Urine: NEGATIVE
Glucose, UA: NEGATIVE mg/dL
Ketones, ur: NEGATIVE mg/dL
Leukocytes, UA: NEGATIVE
NITRITE: NEGATIVE
Protein, ur: NEGATIVE mg/dL
Specific Gravity, Urine: 1.014 (ref 1.005–1.030)
UROBILINOGEN UA: 0.2 mg/dL (ref 0.0–1.0)
pH: 7.5 (ref 5.0–8.0)

## 2014-02-18 LAB — PREGNANCY, URINE: Preg Test, Ur: NEGATIVE

## 2014-02-18 MED ORDER — OXYCODONE-ACETAMINOPHEN 5-325 MG PO TABS: 1.0000 | ORAL_TABLET | Freq: Four times a day (QID) | ORAL | Status: DC | PRN

## 2014-02-18 NOTE — ED Provider Notes (Addendum)
CSN: [id number]     Arrival date & time 02/18/14  1314 History   First MD Initiated Contact with Patient 02/18/14 1505     Chief Complaint  Patient presents with  . Abdominal Pain     (Consider location/radiation/quality/duration/timing/severity/associated sxs/prior Treatment) Patient is a 23 y.o. female presenting with abdominal pain. The history is provided by the patient.  Abdominal Pain Pain location:  LLQ Pain quality: gnawing and sharp   Pain radiates to:  Does not radiate Pain severity:  Moderate Onset quality:  Gradual Duration:  2 weeks Timing:  Constant Progression:  Waxing and waning Chronicity:  Recurrent Context comment:  Pt with hx of ovarian cyst with confirmatory U/S within the last 3 months without complicating features.  recently started new OCP but continues to have bleeding Relieved by:  Nothing Worsened by:  Nothing tried Ineffective treatments:  NSAIDs Associated symptoms: vaginal bleeding   Associated symptoms: no constipation, no diarrhea, no dysuria, no fever, no nausea, no vaginal discharge and no vomiting   Risk factors comment:  Hx of ovarian cyst   Past Medical History  Diagnosis Date  . Polycystic ovary disease   . Ovarian cyst rupture    Past Surgical History  Procedure Laterality Date  . Tossilectomy    . Tonsillectomy     Family History  Problem Relation Age of Onset  . Diabetes Sister   . Diabetes Maternal Grandfather   . Hypertension Maternal Grandfather   . Hypertension Paternal Grandmother   . Heart attack Neg Hx    History  Substance Use Topics  . Smoking status: Never Smoker   . Smokeless tobacco: Never Used  . Alcohol Use: Yes     Comment: occasional   OB History   Grav Para Term Preterm Abortions TAB SAB Ect Mult Living   1    1  1         Review of Systems  Constitutional: Negative for fever.  Gastrointestinal: Positive for abdominal pain. Negative for nausea, vomiting, diarrhea and constipation.  Genitourinary:  Positive for vaginal bleeding. Negative for dysuria and vaginal discharge.  All other systems reviewed and are negative.     Allergies  Review of patient's allergies indicates no known allergies.  Home Medications   Prior to Admission medications   Medication Sig Start Date End Date Taking? Authorizing Provider  ibuprofen (ADVIL,MOTRIN) 600 MG tablet Take 1 tablet (600 mg total) by mouth every 6 (six) hours as needed. 12/31/13  Yes Shari A Upstill, PA-C  norgestimate-ethinyl estradiol (ORTHO-CYCLEN,SPRINTEC,PREVIFEM) 0.25-35 MG-MCG tablet Take 1 tablet by mouth daily. 01/28/14  Yes Willodean Rosenthal, MD  HYDROcodone-acetaminophen (NORCO/VICODIN) 5-325 MG per tablet Take 1-2 tablets by mouth every 4 (four) hours as needed. 02/06/14   Audree Camel, MD  ipratropium (ATROVENT) 0.03 % nasal spray Place 2 sprays into the nose 2 (two) times daily. PRN congestion 05/11/12 05/11/13  Hannah Muthersbaugh, PA-C  metroNIDAZOLE (FLAGYL) 500 MG tablet Take 1 tablet (500 mg total) by mouth 2 (two) times daily. One po bid x 7 days 02/06/14   Audree Camel, MD  naproxen (NAPROSYN) 500 MG tablet Take 1 tablet (500 mg total) by mouth 2 (two) times daily as needed for moderate pain. 01/28/14   Willodean Rosenthal, MD  Phenylephrine-DM-GG-APAP (TYLENOL WARMING COUGH/CONGEST PO) Take 10 mLs by mouth daily as needed. For cough.    Historical Provider, MD  Pseudoeph-CPM-DM-APAP (TYLENOL COLD PO) Take 2 capsules by mouth daily as needed. For cold symptoms.    Historical  Provider, MD   BP 108/62  Pulse 80  Temp(Src) 98.5 F (36.9 C) (Oral)  Resp 18  Ht 5\' 2"  (1.575 m)  Wt 120 lb (54.432 kg)  BMI 21.94 kg/m2  SpO2 100%  LMP 02/18/2014 Physical Exam  Nursing note and vitals reviewed. Constitutional: She is oriented to person, place, and time. She appears well-developed and well-nourished. No distress.  HENT:  Head: Normocephalic and atraumatic.  Mouth/Throat: Oropharynx is clear and moist.  Eyes: EOM  are normal. Pupils are equal, round, and reactive to light.  Neck: Normal range of motion. Neck supple. No spinous process tenderness and no muscular tenderness present. No rigidity. Normal range of motion present.  Cardiovascular: Normal rate, regular rhythm, normal heart sounds and intact distal pulses.  Exam reveals no friction rub.   No murmur heard. Pulmonary/Chest: Effort normal and breath sounds normal. She has no wheezes. She has no rales.  Abdominal: Soft. Normal appearance and bowel sounds are normal. She exhibits no distension. There is tenderness in the left lower quadrant. There is no rebound, no guarding and no CVA tenderness.    Left pelvic pain  Musculoskeletal: She exhibits no tenderness.       Lumbar back: She exhibits decreased range of motion and pain. She exhibits no swelling, no deformity and normal pulse.  No edema  Neurological: She is alert and oriented to person, place, and time. No cranial nerve deficit. Coordination normal.  Reflex Scores:      Patellar reflexes are 1+ on the right side and 1+ on the left side. Skin: Skin is warm and dry. No rash noted.  Psychiatric: She has a normal mood and affect. Her behavior is normal.    ED Course  Procedures (including critical care time) Labs Review Labs Reviewed  URINALYSIS, ROUTINE W REFLEX MICROSCOPIC - Abnormal; Notable for the following:    APPearance CLOUDY (*)    Hgb urine dipstick LARGE (*)    All other components within normal limits  URINE MICROSCOPIC-ADD ON - Abnormal; Notable for the following:    Squamous Epithelial / LPF MANY (*)    All other components within normal limits  PREGNANCY, URINE    Imaging Review No results found.   EKG Interpretation None      MDM   Final diagnoses:  Left ovarian cyst  DUB (dysfunctional uterine bleeding)    Patient with known ovarian cyst on the left. who had negative GC/chlamydia cultures done approximately 2 weeks ago recently started on an OCP one  month ago and for the last 3 weeks has had intermittent bleeding and persistent worsening left lower pelvic pain consistent with her ovarian cyst pain. She's been taking NSAIDs without improvement in pain. Urine pregnancy is negative. UA with blood but otherwise no concerning findings. Given patient's recent extensive workups do not feel that she needs further workup at this time. She was given medication for her pain and she is waiting to have a followup appointment with [facility]    Gwyneth SproutWhitney Adiana Smelcer, MD 02/18/14 1515  Gwyneth SproutWhitney Keniesha Adderly, MD 02/18/14 1517

## 2014-02-18 NOTE — ED Notes (Signed)
Pt states having abd pain from left ovarian cyst. Pt states she has also has been bleeding menstrual cycle for past couple 3 weeks. denis nausea and vomiting

## 2014-02-18 NOTE — Discharge Instructions (Signed)
Abnormal Uterine Bleeding Abnormal uterine bleeding can affect women at various stages in life, including teenagers, women in their reproductive years, pregnant women, and women who have reached menopause. Several kinds of uterine bleeding are considered abnormal, including:  Bleeding or spotting between periods.   Bleeding after sexual intercourse.   Bleeding that is heavier or more than normal.   Periods that last longer than usual.  Bleeding after menopause.  Many cases of abnormal uterine bleeding are minor and simple to treat, while others are more serious. Any type of abnormal bleeding should be evaluated by your health care provider. Treatment will depend on the cause of the bleeding. HOME CARE INSTRUCTIONS Monitor your condition for any changes. The following actions may help to alleviate any discomfort you are experiencing:  Avoid the use of tampons and douches as directed by your health care provider.  Change your pads frequently. You should get regular pelvic exams and Pap tests. Keep all follow-up appointments for diagnostic tests as directed by your health care provider.  SEEK MEDICAL CARE IF:   Your bleeding lasts more than 1 week.   You feel dizzy at times.  SEEK IMMEDIATE MEDICAL CARE IF:   You pass out.   You are changing pads every 15 to 30 minutes.   You have abdominal pain.  You have a fever.   You become sweaty or weak.   You are passing large blood clots from the vagina.   You start to feel nauseous and vomit. MAKE SURE YOU:   Understand these instructions.  Will watch your condition.  Will get help right away if you are not doing well or get worse. Document Released: 09/12/2005 Document Revised: 05/15/2013 Document Reviewed: 04/11/2013 [facility] Patient Information 2014 [place], [place].  Abnormal Uterine Bleeding Abnormal uterine bleeding can affect women at various stages in life, including teenagers, women in their reproductive  years, pregnant women, and women who have reached menopause. Several kinds of uterine bleeding are considered abnormal, including:  Bleeding or spotting between periods.   Bleeding after sexual intercourse.   Bleeding that is heavier or more than normal.   Periods that last longer than usual.  Bleeding after menopause.  Many cases of abnormal uterine bleeding are minor and simple to treat, while others are more serious. Any type of abnormal bleeding should be evaluated by your health care provider. Treatment will depend on the cause of the bleeding. HOME CARE INSTRUCTIONS Monitor your condition for any changes. The following actions may help to alleviate any discomfort you are experiencing:  Avoid the use of tampons and douches as directed by your health care provider.  Change your pads frequently. You should get regular pelvic exams and Pap tests. Keep all follow-up appointments for diagnostic tests as directed by your health care provider.  SEEK MEDICAL CARE IF:   Your bleeding lasts more than 1 week.   You feel dizzy at times.  SEEK IMMEDIATE MEDICAL CARE IF:   You pass out.   You are changing pads every 15 to 30 minutes.   You have abdominal pain.  You have a fever.   You become sweaty or weak.   You are passing large blood clots from the vagina.   You start to feel nauseous and vomit. MAKE SURE YOU:   Understand these instructions.  Will watch your condition.  Will get help right away if you are not doing well or get worse. Document Released: 09/12/2005 Document Revised: 05/15/2013 Document Reviewed: 04/11/2013 [employer] Patient Information 2014  [employer]. ° °

## 2014-02-27 ENCOUNTER — Telehealth: Payer: Self-pay

## 2014-02-27 NOTE — Telephone Encounter (Signed)
Pt called and stated that she is continuing to have pain and that she has been the ER x 3 and was just at [facility] ER on June 1st.  Looking a pt's chart I asked pt if she was still interested in the Mirena that her and the provider talked about.  She informed me that Dr. Erin Fulling told her to give the regimen until August before the use of the Mirena.  Spoke with Mayra Neer and was able to get her an earlier appt scheduled for June 17th @ 1300.  I advised pt to continue to taking the Naproxen for her pain and the OCP's until she can be evaluated on June 17th.  I informed pt that we usually do not prescribe narcotics but she will be evaluated.  Pt stated understanding with no further questions.

## 2014-02-28 ENCOUNTER — Emergency Department (HOSPITAL_BASED_OUTPATIENT_CLINIC_OR_DEPARTMENT_OTHER): Payer: Managed Care, Other (non HMO)

## 2014-02-28 ENCOUNTER — Encounter (HOSPITAL_BASED_OUTPATIENT_CLINIC_OR_DEPARTMENT_OTHER): Payer: Self-pay | Admitting: Emergency Medicine

## 2014-02-28 ENCOUNTER — Emergency Department (HOSPITAL_BASED_OUTPATIENT_CLINIC_OR_DEPARTMENT_OTHER)
Admission: EM | Admit: 2014-02-28 | Discharge: 2014-02-28 | Disposition: A | Discharge status: Home / Self Care | Payer: Managed Care, Other (non HMO) | Attending: Emergency Medicine | Admitting: Emergency Medicine

## 2014-02-28 DIAGNOSIS — Z79899 Other long term (current) drug therapy: Secondary | ICD-10-CM | POA: Insufficient documentation

## 2014-02-28 DIAGNOSIS — Z862 Personal history of diseases of the blood and blood-forming organs and certain disorders involving the immune mechanism: Secondary | ICD-10-CM | POA: Insufficient documentation

## 2014-02-28 DIAGNOSIS — Z8639 Personal history of other endocrine, nutritional and metabolic disease: Secondary | ICD-10-CM | POA: Insufficient documentation

## 2014-02-28 DIAGNOSIS — N83202 Unspecified ovarian cyst, left side: Secondary | ICD-10-CM

## 2014-02-28 DIAGNOSIS — Z792 Long term (current) use of antibiotics: Secondary | ICD-10-CM | POA: Insufficient documentation

## 2014-02-28 DIAGNOSIS — Z3202 Encounter for pregnancy test, result negative: Secondary | ICD-10-CM | POA: Insufficient documentation

## 2014-02-28 DIAGNOSIS — N83209 Unspecified ovarian cyst, unspecified side: Secondary | ICD-10-CM | POA: Insufficient documentation

## 2014-02-28 DIAGNOSIS — Z791 Long term (current) use of non-steroidal anti-inflammatories (NSAID): Secondary | ICD-10-CM | POA: Insufficient documentation

## 2014-02-28 LAB — URINALYSIS, ROUTINE W REFLEX MICROSCOPIC
Bilirubin Urine: NEGATIVE
Glucose, UA: NEGATIVE mg/dL
Hgb urine dipstick: NEGATIVE
KETONES UR: NEGATIVE mg/dL
Leukocytes, UA: NEGATIVE
NITRITE: NEGATIVE
PH: 6 (ref 5.0–8.0)
Protein, ur: NEGATIVE mg/dL
SPECIFIC GRAVITY, URINE: 1.004 — AB (ref 1.005–1.030)
UROBILINOGEN UA: 0.2 mg/dL (ref 0.0–1.0)

## 2014-02-28 LAB — PREGNANCY, URINE: Preg Test, Ur: NEGATIVE

## 2014-02-28 MED ORDER — OXYCODONE-ACETAMINOPHEN 5-325 MG PO TABS
2.0000 | ORAL_TABLET | ORAL | Status: DC | PRN
Start: 2014-02-28 — End: 2014-10-21

## 2014-02-28 NOTE — Discharge Instructions (Signed)

## 2014-02-28 NOTE — ED Notes (Signed)
Left flank pain. Hx of ovarian cyst with the same pain.

## 2014-02-28 NOTE — ED Provider Notes (Signed)
CSN: [id number]     Arrival date & time 02/28/14  1641 History   First MD Initiated Contact with Patient 02/28/14 1649     Chief Complaint  Patient presents with  . Flank Pain     (Consider location/radiation/quality/duration/timing/severity/associated sxs/prior Treatment) Patient is a 23 y.o. female presenting with flank pain. The history is provided by the patient. No language interpreter was used.  Flank Pain This is a new problem. The current episode started yesterday. The problem occurs constantly. The problem has been gradually worsening. Associated symptoms include abdominal pain. Nothing aggravates the symptoms. She has tried nothing for the symptoms. The treatment provided no relief.  Pt reports   Past Medical History  Diagnosis Date  . Polycystic ovary disease   . Ovarian cyst rupture    Past Surgical History  Procedure Laterality Date  . Tossilectomy    . Tonsillectomy     Family History  Problem Relation Age of Onset  . Diabetes Sister   . Diabetes Maternal Grandfather   . Hypertension Maternal Grandfather   . Hypertension Paternal Grandmother   . Heart attack Neg Hx    History  Substance Use Topics  . Smoking status: Never Smoker   . Smokeless tobacco: Never Used  . Alcohol Use: Yes     Comment: occasional   OB History   Grav Para Term Preterm Abortions TAB SAB Ect Mult Living   1    1  1         Review of Systems  Gastrointestinal: Positive for abdominal pain.  Genitourinary: Positive for flank pain.  All other systems reviewed and are negative.     Allergies  Review of patient's allergies indicates no known allergies.  Home Medications   Prior to Admission medications   Medication Sig Start Date End Date Taking? Authorizing Provider  HYDROcodone-acetaminophen (NORCO/VICODIN) 5-325 MG per tablet Take 1-2 tablets by mouth every 4 (four) hours as needed. 02/06/14   Audree CamelScott T Goldston, MD  ibuprofen (ADVIL,MOTRIN) 600 MG tablet Take 1 tablet (600 mg  total) by mouth every 6 (six) hours as needed. 12/31/13   Shari A Upstill, PA-C  ipratropium (ATROVENT) 0.03 % nasal spray Place 2 sprays into the nose 2 (two) times daily. PRN congestion 05/11/12 05/11/13  Hannah Muthersbaugh, PA-C  metroNIDAZOLE (FLAGYL) 500 MG tablet Take 1 tablet (500 mg total) by mouth 2 (two) times daily. One po bid x 7 days 02/06/14   Audree CamelScott T Goldston, MD  naproxen (NAPROSYN) 500 MG tablet Take 1 tablet (500 mg total) by mouth 2 (two) times daily as needed for moderate pain. 01/28/14   Willodean Rosenthalarolyn Harraway-Smith, MD  norgestimate-ethinyl estradiol (ORTHO-CYCLEN,SPRINTEC,PREVIFEM) 0.25-35 MG-MCG tablet Take 1 tablet by mouth daily. 01/28/14   Willodean Rosenthalarolyn Harraway-Smith, MD  oxyCODONE-acetaminophen (PERCOCET/ROXICET) 5-325 MG per tablet Take 1-2 tablets by mouth every 6 (six) hours as needed for severe pain. 02/18/14   Gwyneth SproutWhitney Plunkett, MD  Phenylephrine-DM-GG-APAP (TYLENOL WARMING COUGH/CONGEST PO) Take 10 mLs by mouth daily as needed. For cough.    Historical Provider, MD  Pseudoeph-CPM-DM-APAP (TYLENOL COLD PO) Take 2 capsules by mouth daily as needed. For cold symptoms.    Historical Provider, MD   BP 106/58  Pulse 68  Temp(Src) 98 F (36.7 C) (Oral)  Resp 17  Ht 5\' 2"  (1.575 m)  Wt 120 lb (54.432 kg)  BMI 21.94 kg/m2  SpO2 100%  LMP 02/18/2014 Physical Exam  Nursing note and vitals reviewed. Constitutional: She appears well-developed and well-nourished.  HENT:  Head: Normocephalic  and atraumatic.  Cardiovascular: Normal rate and normal heart sounds.   Pulmonary/Chest: Effort normal and breath sounds normal.  Abdominal: Soft.  Musculoskeletal: Normal range of motion.  Neurological: She is alert.  Skin: Skin is warm.  Psychiatric: She has a normal mood and affect.    ED Course  Procedures (including critical care time) Labs Review Labs Reviewed  URINALYSIS, ROUTINE W REFLEX MICROSCOPIC - Abnormal; Notable for the following:    Specific Gravity, Urine 1.004 (*)    All  other components within normal limits  PREGNANCY, URINE    Imaging Review US Transvaginal Non-ob  02/28/2014   CLINICAL DATA:  Left pelvic and adnexal pain since noon today. Prior history of complex left-sided ovarian cysts.  EXAM: TRANSABDOMINAL AND TRANSVAGINAL ULTRASOUND OF PELVIS  DOPPLER ULTRASOUND OF OVARIES  TECHNIQUE: Both transabdominal and transvaginal ultrasound examinations of the pelvis were performed. Transabdominal technique was performed for global imaging of the pelvis including uterus, ovaries, adnexal regions, and pelvic cul-de-sac.  It was necessary to proceed with endovaginal exam following the transabdominal exam to visualize the adnexal regions. Color and duplex Doppler ultrasound was utilized to evaluate blood flow to the ovaries.  COMPARISON:  02/11/2014.  FINDINGS: Uterus  Measurements: 8.0 x 3.2 x 4.4 cm. No fibroids or other mass visualized.  Endometrium  Thickness: 6.4 mm. Tiny nabothian cysts in the cervix incidentally noted.  Right ovary  Measurements: 5.3 x 2.9 x 2.4 cm. Normal appearance/no adnexal mass.  Left ovary  Measurements: 4.7 x 3.0 x 3.8 cm. Multiple normal appearing follicles. In addition, there is a 2.5 x 2.1 x 2.0 cm lesion of heterogeneous internal echotexture, generally slightly hyperechoic, which appears slightly smaller than prior study 02/11/2014, at which point it appeared most compatible with a hemorrhagic cyst. This appears to have a small amount of internal blood flow.  Pulsed Doppler evaluation of both ovaries demonstrates normal low-resistance arterial and venous waveforms.  Other findings  Small amount of free fluid in the cul-de-sac, favored to be physiologic in this young female patient.  IMPRESSION: 1. No evidence of ovarian torsion. 2. The previously noted left adnexal lesion appears slightly smaller than the prior study, and is favored to represent a resolving degenerating hemorrhagic cyst. Repeat pelvic ultrasound in 6 weeks is recommended to ensure  complete resolution of this finding. 3. Small volume of free fluid in the cul-de-sac, likely physiologic in this young female patient. 4. Tiny nabothian cysts in the cervix incidentally noted.   Electronically Signed   By: Trudie Reed M.D.   On: 02/28/2014 18:31   US Pelvis Complete  02/28/2014   CLINICAL DATA:  Left pelvic and adnexal pain since noon today. Prior history of complex left-sided ovarian cysts.  EXAM: TRANSABDOMINAL AND TRANSVAGINAL ULTRASOUND OF PELVIS  DOPPLER ULTRASOUND OF OVARIES  TECHNIQUE: Both transabdominal and transvaginal ultrasound examinations of the pelvis were performed. Transabdominal technique was performed for global imaging of the pelvis including uterus, ovaries, adnexal regions, and pelvic cul-de-sac.  It was necessary to proceed with endovaginal exam following the transabdominal exam to visualize the adnexal regions. Color and duplex Doppler ultrasound was utilized to evaluate blood flow to the ovaries.  COMPARISON:  02/11/2014.  FINDINGS: Uterus  Measurements: 8.0 x 3.2 x 4.4 cm. No fibroids or other mass visualized.  Endometrium  Thickness: 6.4 mm. Tiny nabothian cysts in the cervix incidentally noted.  Right ovary  Measurements: 5.3 x 2.9 x 2.4 cm. Normal appearance/no adnexal mass.  Left ovary  Measurements: 4.7 x 3.0  x 3.8 cm. Multiple normal appearing follicles. In addition, there is a 2.5 x 2.1 x 2.0 cm lesion of heterogeneous internal echotexture, generally slightly hyperechoic, which appears slightly smaller than prior study 02/11/2014, at which point it appeared most compatible with a hemorrhagic cyst. This appears to have a small amount of internal blood flow.  Pulsed Doppler evaluation of both ovaries demonstrates normal low-resistance arterial and venous waveforms.  Other findings  Small amount of free fluid in the cul-de-sac, favored to be physiologic in this young female patient.  IMPRESSION: 1. No evidence of ovarian torsion. 2. The previously noted left  adnexal lesion appears slightly smaller than the prior study, and is favored to represent a resolving degenerating hemorrhagic cyst. Repeat pelvic ultrasound in 6 weeks is recommended to ensure complete resolution of this finding. 3. Small volume of free fluid in the cul-de-sac, likely physiologic in this young female patient. 4. Tiny nabothian cysts in the cervix incidentally noted.   Electronically Signed   By: Trudie Reed M.D.   On: 02/28/2014 18:31   [place] Art/ven Flow Abd Pelv Doppler  02/28/2014   CLINICAL DATA:  Left pelvic and adnexal pain since noon today. Prior history of complex left-sided ovarian cysts.  EXAM: TRANSABDOMINAL AND TRANSVAGINAL ULTRASOUND OF PELVIS  DOPPLER ULTRASOUND OF OVARIES  TECHNIQUE: Both transabdominal and transvaginal ultrasound examinations of the pelvis were performed. Transabdominal technique was performed for global imaging of the pelvis including uterus, ovaries, adnexal regions, and pelvic cul-de-sac.  It was necessary to proceed with endovaginal exam following the transabdominal exam to visualize the adnexal regions. Color and duplex Doppler ultrasound was utilized to evaluate blood flow to the ovaries.  COMPARISON:  02/11/2014.  FINDINGS: Uterus  Measurements: 8.0 x 3.2 x 4.4 cm. No fibroids or other mass visualized.  Endometrium  Thickness: 6.4 mm. Tiny nabothian cysts in the cervix incidentally noted.  Right ovary  Measurements: 5.3 x 2.9 x 2.4 cm. Normal appearance/no adnexal mass.  Left ovary  Measurements: 4.7 x 3.0 x 3.8 cm. Multiple normal appearing follicles. In addition, there is a 2.5 x 2.1 x 2.0 cm lesion of heterogeneous internal echotexture, generally slightly hyperechoic, which appears slightly smaller than prior study 02/11/2014, at which point it appeared most compatible with a hemorrhagic cyst. This appears to have a small amount of internal blood flow.  Pulsed Doppler evaluation of both ovaries demonstrates normal low-resistance arterial and venous  waveforms.  Other findings  Small amount of free fluid in the cul-de-sac, favored to be physiologic in this young female patient.  IMPRESSION: 1. No evidence of ovarian torsion. 2. The previously noted left adnexal lesion appears slightly smaller than the prior study, and is favored to represent a resolving degenerating hemorrhagic cyst. Repeat pelvic ultrasound in 6 weeks is recommended to ensure complete resolution of this finding. 3. Small volume of free fluid in the cul-de-sac, likely physiologic in this young female patient. 4. Tiny nabothian cysts in the cervix incidentally noted.   Electronically Signed   By: Trudie Reed M.D.   On: 02/28/2014 18:31     EKG Interpretation None      MDM Pt has known ovarian cyst with increased pain.   I obtained ultrasound to rule out torsion.   No torsion.   Pt given rx for pain medication.   Final diagnoses:  Ovarian cyst, left    Hydrocodone Follow up at [facility]     Elson Areas, PA-C 02/28/14 1856

## 2014-03-06 NOTE — ED Provider Notes (Signed)
Medical screening examination/treatment/procedure(s) were performed by non-physician practitioner and as supervising physician I was immediately available for consultation/collaboration.   EKG Interpretation None       EKG Interpretation None       Toy Baker, MD 03/06/14 1718

## 2014-03-12 ENCOUNTER — Ambulatory Visit: Payer: Self-pay | Admitting: Obstetrics & Gynecology

## 2014-03-12 ENCOUNTER — Telehealth: Payer: Self-pay | Admitting: *Deleted

## 2014-03-12 NOTE — Telephone Encounter (Signed)
Attempted to call patient, no answer, left message for patient to call clinic and reschedule missed appointment if she feels she needs to be seen.

## 2014-03-14 ENCOUNTER — Encounter (HOSPITAL_BASED_OUTPATIENT_CLINIC_OR_DEPARTMENT_OTHER): Payer: Self-pay | Admitting: Emergency Medicine

## 2014-03-14 ENCOUNTER — Emergency Department (HOSPITAL_BASED_OUTPATIENT_CLINIC_OR_DEPARTMENT_OTHER)
Admission: EM | Admit: 2014-03-14 | Discharge: 2014-03-14 | Disposition: A | Discharge status: Home / Self Care | Payer: Managed Care, Other (non HMO) | Attending: Emergency Medicine | Admitting: Emergency Medicine

## 2014-03-14 DIAGNOSIS — N83209 Unspecified ovarian cyst, unspecified side: Secondary | ICD-10-CM | POA: Insufficient documentation

## 2014-03-14 DIAGNOSIS — Z79899 Other long term (current) drug therapy: Secondary | ICD-10-CM | POA: Insufficient documentation

## 2014-03-14 DIAGNOSIS — Z3202 Encounter for pregnancy test, result negative: Secondary | ICD-10-CM | POA: Insufficient documentation

## 2014-03-14 DIAGNOSIS — Z791 Long term (current) use of non-steroidal anti-inflammatories (NSAID): Secondary | ICD-10-CM | POA: Insufficient documentation

## 2014-03-14 DIAGNOSIS — N83202 Unspecified ovarian cyst, left side: Secondary | ICD-10-CM

## 2014-03-14 LAB — CBC WITH DIFFERENTIAL/PLATELET
Basophils Absolute: 0 10*3/uL (ref 0.0–0.1)
Basophils Relative: 0 % (ref 0–1)
EOS ABS: 0.1 10*3/uL (ref 0.0–0.7)
Eosinophils Relative: 1 % (ref 0–5)
HCT: 39.9 % (ref 36.0–46.0)
HEMOGLOBIN: 13.8 g/dL (ref 12.0–15.0)
LYMPHS ABS: 2.6 10*3/uL (ref 0.7–4.0)
Lymphocytes Relative: 26 % (ref 12–46)
MCH: 31.9 pg (ref 26.0–34.0)
MCHC: 34.6 g/dL (ref 30.0–36.0)
MCV: 92.4 fL (ref 78.0–100.0)
Monocytes Absolute: 0.7 10*3/uL (ref 0.1–1.0)
Monocytes Relative: 7 % (ref 3–12)
NEUTROS ABS: 6.7 10*3/uL (ref 1.7–7.7)
NEUTROS PCT: 67 % (ref 43–77)
PLATELETS: 236 10*3/uL (ref 150–400)
RBC: 4.32 MIL/uL (ref 3.87–5.11)
RDW: 11.5 % (ref 11.5–15.5)
WBC: 10 10*3/uL (ref 4.0–10.5)

## 2014-03-14 LAB — COMPREHENSIVE METABOLIC PANEL
ALBUMIN: 4.8 g/dL (ref 3.5–5.2)
ALK PHOS: 37 U/L — AB (ref 39–117)
ALT: 9 U/L (ref 0–35)
AST: 13 U/L (ref 0–37)
BILIRUBIN TOTAL: 0.2 mg/dL — AB (ref 0.3–1.2)
BUN: 12 mg/dL (ref 6–23)
CHLORIDE: 101 meq/L (ref 96–112)
CO2: 25 mEq/L (ref 19–32)
Calcium: 9.5 mg/dL (ref 8.4–10.5)
Creatinine, Ser: 0.7 mg/dL (ref 0.50–1.10)
GFR calc Af Amer: >90 mL/min (ref >90)
GFR calc non Af Amer: >90 mL/min (ref >90)
GLUCOSE: 92 mg/dL (ref 70–99)
POTASSIUM: 3.8 meq/L (ref 3.7–5.3)
SODIUM: 139 meq/L (ref 137–147)
Total Protein: 7.6 g/dL (ref 6.0–8.3)

## 2014-03-14 LAB — URINALYSIS, ROUTINE W REFLEX MICROSCOPIC
Bilirubin Urine: NEGATIVE
Glucose, UA: NEGATIVE mg/dL
Hgb urine dipstick: NEGATIVE
KETONES UR: NEGATIVE mg/dL
LEUKOCYTES UA: NEGATIVE
NITRITE: NEGATIVE
PH: 6.5 (ref 5.0–8.0)
Protein, ur: NEGATIVE mg/dL
Specific Gravity, Urine: 1.017 (ref 1.005–1.030)
Urobilinogen, UA: 1 mg/dL (ref 0.0–1.0)

## 2014-03-14 LAB — PREGNANCY, URINE: Preg Test, Ur: NEGATIVE

## 2014-03-14 MED ORDER — HYDROCODONE-ACETAMINOPHEN 5-325 MG PO TABS: 2.0000 | ORAL_TABLET | ORAL | Status: DC | PRN

## 2014-03-14 MED ORDER — IBUPROFEN 800 MG PO TABS: 800.0000 mg | ORAL_TABLET | Freq: Three times a day (TID) | ORAL | Status: DC

## 2014-03-14 NOTE — ED Provider Notes (Signed)
Medical screening examination/treatment/procedure(s) were performed by non-physician practitioner and as supervising physician I was immediately available for consultation/collaboration.    Robert L Beaton, MD 03/14/14 2258 

## 2014-03-14 NOTE — Discharge Instructions (Signed)
Ovarian Cyst An ovarian cyst is a fluid-filled sac that forms on an ovary. The ovaries are small organs that produce eggs in women. Various types of cysts can form on the ovaries. Most are not cancerous. Many do not cause problems, and they often go away on their own. Some may cause symptoms and require treatment. Common types of ovarian cysts include:  Functional cysts--These cysts may occur every month during the menstrual cycle. This is normal. The cysts usually go away with the next menstrual cycle if the woman does not get pregnant. Usually, there are no symptoms with a functional cyst.  Endometrioma cysts--These cysts form from the tissue that lines the uterus. They are also called "chocolate cysts" because they become filled with blood that turns brown. This type of cyst can cause pain in the lower abdomen during intercourse and with your menstrual period.  Cystadenoma cysts--This type develops from the cells on the outside of the ovary. These cysts can get very big and cause lower abdomen pain and pain with intercourse. This type of cyst can twist on itself, cut off its blood supply, and cause severe pain. It can also easily rupture and cause a lot of pain.  Dermoid cysts--This type of cyst is sometimes found in both ovaries. These cysts may contain different kinds of body tissue, such as skin, teeth, hair, or cartilage. They usually do not cause symptoms unless they get very big.  Theca lutein cysts--These cysts occur when too much of a certain hormone (human chorionic gonadotropin) is produced and overstimulates the ovaries to produce an egg. This is most common after procedures used to assist with the conception of a baby (in vitro fertilization). CAUSES   Fertility drugs can cause a condition in which multiple large cysts are formed on the ovaries. This is called ovarian hyperstimulation syndrome.  A condition called polycystic ovary syndrome can cause hormonal imbalances that can lead to  nonfunctional ovarian cysts. SIGNS AND SYMPTOMS  Many ovarian cysts do not cause symptoms. If symptoms are present, they may include:  Pelvic pain or pressure.  Pain in the lower abdomen.  Pain during sexual intercourse.  Increasing girth (swelling) of the abdomen.  Abnormal menstrual periods.  Increasing pain with menstrual periods.  Stopping having menstrual periods without being pregnant. DIAGNOSIS  These cysts are commonly found during a routine or annual pelvic exam. Tests may be ordered to find out more about the cyst. These tests may include:  Ultrasound.  X-ray of the pelvis.  CT scan.  MRI.  Blood tests. TREATMENT  Many ovarian cysts go away on their own without treatment. Your health care provider may want to check your cyst regularly for 2-3 months to see if it changes. For women in menopause, it is particularly important to monitor a cyst closely because of the higher rate of ovarian cancer in menopausal women. When treatment is needed, it may include any of the following:  A procedure to drain the cyst (aspiration). This may be done using a long needle and ultrasound. It can also be done through a laparoscopic procedure. This involves using a thin, lighted tube with a tiny camera on the end (laparoscope) inserted through a small incision.  Surgery to remove the whole cyst. This may be done using laparoscopic surgery or an open surgery involving a larger incision in the lower abdomen.  Hormone treatment or birth control pills. These methods are sometimes used to help dissolve a cyst. HOME CARE INSTRUCTIONS   Only take over-the-counter   or prescription medicines as directed by your health care provider.  Follow up with your health care provider as directed.  Get regular pelvic exams and Pap tests. SEEK MEDICAL CARE IF:   Your periods are late, irregular, or painful, or they stop.  Your pelvic pain or abdominal pain does not go away.  Your abdomen becomes  larger or swollen.  You have pressure on your bladder or trouble emptying your bladder completely.  You have pain during sexual intercourse.  You have feelings of fullness, pressure, or discomfort in your stomach.  You lose weight for no apparent reason.  You feel generally ill.  You become constipated.  You lose your appetite.  You develop acne.  You have an increase in body and facial hair.  You are gaining weight, without changing your exercise and eating habits.  You think you are pregnant. SEEK IMMEDIATE MEDICAL CARE IF:   You have increasing abdominal pain.  You feel sick to your stomach (nauseous), and you throw up (vomit).  You develop a fever that comes on suddenly.  You have abdominal pain during a bowel movement.  Your menstrual periods become heavier than usual. MAKE SURE YOU:  Understand these instructions.  Will watch your condition.  Will get help right away if you are not doing well or get worse. Document Released: 09/12/2005 Document Revised: 09/17/2013 Document Reviewed: 05/20/2013 [employer] Patient Information 2015 [employer]. This information is not intended to replace advice given to you by your health care provider. Make sure you discuss any questions you have with your health care provider.  

## 2014-03-14 NOTE — ED Notes (Signed)
Pt having left lower abdominal pain since 1300.  Pt denies vaginal discharge.  No dysuria.  Some nausea, no V/D.

## 2014-03-14 NOTE — ED Provider Notes (Signed)
CSN: [id number]     Arrival date & time 03/14/14  1721 History   First MD Initiated Contact with Patient 03/14/14 1836     Chief Complaint  Patient presents with  . Abdominal Pain     (Consider location/radiation/quality/duration/timing/severity/associated sxs/prior Treatment) Patient is a 23 y.o. female presenting with abdominal pain. The history is provided by the patient. No language interpreter was used.  Abdominal Pain Pain location:  LLQ Pain quality: aching   Pain radiates to:  Does not radiate Pain severity:  Moderate Onset quality:  Gradual Duration:  1 day Timing:  Constant Progression:  Worsening Chronicity:  New Relieved by:  Nothing Worsened by:  Nothing tried Ineffective treatments:  None tried Associated symptoms: no fever and no nausea     Past Medical History  Diagnosis Date  . Polycystic ovary disease   . Ovarian cyst rupture    Past Surgical History  Procedure Laterality Date  . Tossilectomy    . Tonsillectomy     Family History  Problem Relation Age of Onset  . Diabetes Sister   . Diabetes Maternal Grandfather   . Hypertension Maternal Grandfather   . Hypertension Paternal Grandmother   . Heart attack Neg Hx    History  Substance Use Topics  . Smoking status: Never Smoker   . Smokeless tobacco: Never Used  . Alcohol Use: Yes     Comment: occasional   OB History   Grav Para Term Preterm Abortions TAB SAB Ect Mult Living   1    1  1         Review of Systems  Unable to perform ROS Constitutional: Negative for fever.  Gastrointestinal: Positive for abdominal pain. Negative for nausea.  All other systems reviewed and are negative.     Allergies  Review of patient's allergies indicates no known allergies.  Home Medications   Prior to Admission medications   Medication Sig Start Date End Date Taking? Authorizing Brodric Schauer  norgestimate-ethinyl estradiol (ORTHO-CYCLEN,SPRINTEC,PREVIFEM) 0.25-35 MG-MCG tablet Take 1 tablet by mouth  daily. 01/28/14  Yes Willodean Rosenthalarolyn Harraway-Smith, MD  HYDROcodone-acetaminophen (NORCO/VICODIN) 5-325 MG per tablet Take 2 tablets by mouth every 4 (four) hours as needed. 03/14/14   Elson AreasLeslie K Sofia, PA-C  ibuprofen (ADVIL,MOTRIN) 600 MG tablet Take 1 tablet (600 mg total) by mouth every 6 (six) hours as needed. 12/31/13   Shari A Upstill, PA-C  ibuprofen (ADVIL,MOTRIN) 800 MG tablet Take 1 tablet (800 mg total) by mouth 3 (three) times daily. 03/14/14   Elson AreasLeslie K Sofia, PA-C  oxyCODONE-acetaminophen (PERCOCET/ROXICET) 5-325 MG per tablet Take 2 tablets by mouth every 4 (four) hours as needed for severe pain. 02/28/14   Elson AreasLeslie K Sofia, PA-C   BP 120/65  Pulse 81  Temp(Src) 98.5 F (36.9 C) (Oral)  Resp 18  Ht 5\' 2"  (1.575 m)  Wt 120 lb (54.432 kg)  BMI 21.94 kg/m2  SpO2 100%  LMP 02/18/2014 Physical Exam  Nursing note and vitals reviewed. Constitutional: She is oriented to person, place, and time. She appears well-developed and well-nourished.  HENT:  Head: Normocephalic.  Eyes: Conjunctivae and EOM are normal. Pupils are equal, round, and reactive to light.  Neck: Normal range of motion.  Pulmonary/Chest: Effort normal.  Abdominal: Soft. She exhibits no distension.  Musculoskeletal: Normal range of motion.  Neurological: She is alert and oriented to person, place, and time.  Skin: Skin is warm.  Psychiatric: She has a normal mood and affect.    ED Course  Procedures (including critical  care time) Labs Review Labs Reviewed  URINALYSIS, ROUTINE W REFLEX MICROSCOPIC - Abnormal; Notable for the following:    APPearance CLOUDY (*)    All other components within normal limits  COMPREHENSIVE METABOLIC PANEL - Abnormal; Notable for the following:    Alkaline Phosphatase 37 (*)    Total Bilirubin 0.2 (*)    All other components within normal limits  PREGNANCY, URINE  CBC WITH DIFFERENTIAL    Imaging Review No results found.   EKG Interpretation None      MDM   Final diagnoses:  Left  ovarian cyst    [facility] Ibuprofen Hydrocodone     Elson AreasLeslie K Sofia, PA-C 03/14/14 1912

## 2014-03-24 ENCOUNTER — Emergency Department (HOSPITAL_BASED_OUTPATIENT_CLINIC_OR_DEPARTMENT_OTHER)
Admission: EM | Admit: 2014-03-24 | Discharge: 2014-03-24 | Disposition: A | Discharge status: Home / Self Care | Payer: Managed Care, Other (non HMO) | Attending: Emergency Medicine | Admitting: Emergency Medicine

## 2014-03-24 ENCOUNTER — Encounter (HOSPITAL_BASED_OUTPATIENT_CLINIC_OR_DEPARTMENT_OTHER): Payer: Self-pay | Admitting: Emergency Medicine

## 2014-03-24 ENCOUNTER — Emergency Department (HOSPITAL_BASED_OUTPATIENT_CLINIC_OR_DEPARTMENT_OTHER): Payer: Managed Care, Other (non HMO)

## 2014-03-24 DIAGNOSIS — Z79899 Other long term (current) drug therapy: Secondary | ICD-10-CM | POA: Insufficient documentation

## 2014-03-24 DIAGNOSIS — Z791 Long term (current) use of non-steroidal anti-inflammatories (NSAID): Secondary | ICD-10-CM | POA: Insufficient documentation

## 2014-03-24 DIAGNOSIS — N760 Acute vaginitis: Secondary | ICD-10-CM | POA: Insufficient documentation

## 2014-03-24 DIAGNOSIS — N83209 Unspecified ovarian cyst, unspecified side: Secondary | ICD-10-CM | POA: Insufficient documentation

## 2014-03-24 DIAGNOSIS — N83202 Unspecified ovarian cyst, left side: Secondary | ICD-10-CM

## 2014-03-24 DIAGNOSIS — A499 Bacterial infection, unspecified: Secondary | ICD-10-CM | POA: Insufficient documentation

## 2014-03-24 DIAGNOSIS — N898 Other specified noninflammatory disorders of vagina: Secondary | ICD-10-CM

## 2014-03-24 DIAGNOSIS — B9689 Other specified bacterial agents as the cause of diseases classified elsewhere: Secondary | ICD-10-CM | POA: Insufficient documentation

## 2014-03-24 DIAGNOSIS — Z3202 Encounter for pregnancy test, result negative: Secondary | ICD-10-CM | POA: Insufficient documentation

## 2014-03-24 LAB — URINALYSIS, ROUTINE W REFLEX MICROSCOPIC
BILIRUBIN URINE: NEGATIVE
Glucose, UA: NEGATIVE mg/dL
HGB URINE DIPSTICK: NEGATIVE
Ketones, ur: 15 mg/dL — AB
Leukocytes, UA: NEGATIVE
Nitrite: POSITIVE — AB
PH: 5.5 (ref 5.0–8.0)
Protein, ur: NEGATIVE mg/dL
Specific Gravity, Urine: 1.026 (ref 1.005–1.030)
Urobilinogen, UA: 1 mg/dL (ref 0.0–1.0)

## 2014-03-24 LAB — BASIC METABOLIC PANEL
BUN: 12 mg/dL (ref 6–23)
CO2: 24 mEq/L (ref 19–32)
CREATININE: 0.8 mg/dL (ref 0.50–1.10)
Calcium: 9.9 mg/dL (ref 8.4–10.5)
Chloride: 106 mEq/L (ref 96–112)
GFR calc Af Amer: >90 mL/min (ref >90)
GFR calc non Af Amer: >90 mL/min (ref >90)
Glucose, Bld: 97 mg/dL (ref 70–99)
POTASSIUM: 4.3 meq/L (ref 3.7–5.3)
Sodium: 142 mEq/L (ref 137–147)

## 2014-03-24 LAB — WET PREP, GENITAL
Trich, Wet Prep: NONE SEEN
Yeast Wet Prep HPF POC: NONE SEEN

## 2014-03-24 LAB — CBC
HCT: 40.2 % (ref 36.0–46.0)
Hemoglobin: 14 g/dL (ref 12.0–15.0)
MCH: 32.3 pg (ref 26.0–34.0)
MCHC: 34.8 g/dL (ref 30.0–36.0)
MCV: 92.6 fL (ref 78.0–100.0)
Platelets: 242 10*3/uL (ref 150–400)
RBC: 4.34 MIL/uL (ref 3.87–5.11)
RDW: 11.4 % — AB (ref 11.5–15.5)
WBC: 6.1 10*3/uL (ref 4.0–10.5)

## 2014-03-24 LAB — URINE MICROSCOPIC-ADD ON

## 2014-03-24 LAB — PREGNANCY, URINE: Preg Test, Ur: NEGATIVE

## 2014-03-24 MED ORDER — OXYCODONE-ACETAMINOPHEN 5-325 MG PO TABS
1.0000 | ORAL_TABLET | Freq: Once | ORAL | Status: AC
  Administered 2014-03-24: 1 via ORAL
  Filled 2014-03-24: qty 1

## 2014-03-24 MED ORDER — OXYCODONE-ACETAMINOPHEN 5-325 MG PO TABS: 1.0000 | ORAL_TABLET | Freq: Four times a day (QID) | ORAL | Status: DC | PRN

## 2014-03-24 MED ORDER — AZITHROMYCIN 250 MG PO TABS
1000.0000 mg | ORAL_TABLET | Freq: Once | ORAL | Status: AC
Start: 2014-03-24 — End: 2014-03-24
  Administered 2014-03-24: 1000 mg via ORAL
  Filled 2014-03-24: qty 4

## 2014-03-24 MED ORDER — CEFTRIAXONE SODIUM 250 MG IJ SOLR
250.0000 mg | Freq: Once | INTRAMUSCULAR | Status: AC
  Administered 2014-03-24: 250 mg via INTRAMUSCULAR
  Filled 2014-03-24: qty 250

## 2014-03-24 MED ORDER — METRONIDAZOLE 500 MG PO TABS: 500.0000 mg | ORAL_TABLET | Freq: Two times a day (BID) | ORAL | Status: DC

## 2014-03-24 NOTE — ED Notes (Addendum)
Abdominal pain that started this am.  Has been evaluated several times for same issue.  Followed up with GYN 6/23.  Taking Toradol and Ibuprofen for pain. Also taking OTC AZO.

## 2014-03-24 NOTE — ED Provider Notes (Signed)
CSN: [id number]     Arrival date & time 03/24/14  0840 History   First MD Initiated Contact with Patient 03/24/14 0900     Chief Complaint  Patient presents with  . Abdominal Pain     (Consider location/radiation/quality/duration/timing/severity/associated sxs/prior Treatment) HPI Comments: Pt states that she has been having on going pain with a left ovarian cyst. Pt states that she has been seen here and the last visit that she had was with a gyn and he put her on a medication "that starts with a P" to make the cyst shrink down. Pt states that the symptoms area worsening over the last couple of days. Denies discharge and dysuria. States that she has had fever and vomiting.   The history is provided by the patient. No language interpreter was used.    Past Medical History  Diagnosis Date  . Polycystic ovary disease   . Ovarian cyst rupture    Past Surgical History  Procedure Laterality Date  . Tossilectomy    . Tonsillectomy     Family History  Problem Relation Age of Onset  . Diabetes Sister   . Diabetes Maternal Grandfather   . Hypertension Maternal Grandfather   . Hypertension Paternal Grandmother   . Heart attack Neg Hx    History  Substance Use Topics  . Smoking status: Never Smoker   . Smokeless tobacco: Never Used  . Alcohol Use: Yes     Comment: occasional   OB History   Grav Para Term Preterm Abortions TAB SAB Ect Mult Living   1    1  1         Review of Systems  Constitutional: Negative.   Respiratory: Negative.   Cardiovascular: Negative.       Allergies  Review of patient's allergies indicates no known allergies.  Home Medications   Prior to Admission medications   Medication Sig Start Date End Date Taking? Authorizing Provider  HYDROcodone-acetaminophen (NORCO/VICODIN) 5-325 MG per tablet Take 2 tablets by mouth every 4 (four) hours as needed. 03/14/14   Elson AreasLeslie K Sofia, PA-C  ibuprofen (ADVIL,MOTRIN) 600 MG tablet Take 1 tablet (600 mg total)  by mouth every 6 (six) hours as needed. 12/31/13   Shari A Upstill, PA-C  ibuprofen (ADVIL,MOTRIN) 800 MG tablet Take 1 tablet (800 mg total) by mouth 3 (three) times daily. 03/14/14   Elson AreasLeslie K Sofia, PA-C  norgestimate-ethinyl estradiol (ORTHO-CYCLEN,SPRINTEC,PREVIFEM) 0.25-35 MG-MCG tablet Take 1 tablet by mouth daily. 01/28/14   Willodean Rosenthalarolyn Harraway-Smith, MD  oxyCODONE-acetaminophen (PERCOCET/ROXICET) 5-325 MG per tablet Take 2 tablets by mouth every 4 (four) hours as needed for severe pain. 02/28/14   Elson AreasLeslie K Sofia, PA-C   BP 111/71  Pulse 68  Temp(Src) 98.4 F (36.9 C) (Oral)  Resp 16  SpO2 100%  LMP 02/18/2014 Physical Exam  Nursing note and vitals reviewed. Constitutional: She is oriented to person, place, and time. She appears well-developed and well-nourished.  Cardiovascular: Normal rate and regular rhythm.   Pulmonary/Chest: Effort normal and breath sounds normal.  Abdominal: Soft. Bowel sounds are normal. There is tenderness in the left lower quadrant.  Genitourinary:  Left adnexal tenderness. Mucus, bloody drainage noted  Musculoskeletal: Normal range of motion.  Neurological: She is alert and oriented to person, place, and time.  Skin: Skin is warm and dry.  Psychiatric: She has a normal mood and affect.    ED Course  Procedures (including critical care time) Labs Review Labs Reviewed  WET PREP, GENITAL - Abnormal; Notable for  the following:    Clue Cells Wet Prep HPF POC MANY (*)    WBC, Wet Prep HPF POC FEW (*)    All other components within normal limits  URINALYSIS, ROUTINE W REFLEX MICROSCOPIC - Abnormal; Notable for the following:    Color, Urine ORANGE (*)    APPearance CLOUDY (*)    Ketones, ur 15 (*)    Nitrite POSITIVE (*)    All other components within normal limits  URINE MICROSCOPIC-ADD ON - Abnormal; Notable for the following:    Squamous Epithelial / LPF MANY (*)    Bacteria, UA MANY (*)    All other components within normal limits  CBC - Abnormal;  Notable for the following:    RDW 11.4 (*)    All other components within normal limits  URINE CULTURE  GC/CHLAMYDIA PROBE AMP  PREGNANCY, URINE  BASIC METABOLIC PANEL    Imaging Review US Transvaginal Non-ob  03/24/2014   CLINICAL DATA:  Left lower quadrant pain for 2.5 weeks. Recent small left ovarian cyst.  EXAM: TRANSABDOMINAL AND TRANSVAGINAL ULTRASOUND OF PELVIS  TECHNIQUE: Both transabdominal and transvaginal ultrasound examinations of the pelvis were performed. Transabdominal technique was performed for global imaging of the pelvis including uterus, ovaries, adnexal regions, and pelvic cul-de-sac. It was necessary to proceed with endovaginal exam following the transabdominal exam to visualize the uterus, ovaries, and adnexa.  COMPARISON:  03/05/2014  FINDINGS: Uterus  Measurements: 7.4 x 3.5 x 4.8 cm. Normal in morphology.  Endometrium  Thickness: 8 mm.  No focal abnormality visualized.  Right ovary  Measurements: 2.8 x 1.9 x 2.1 cm. Normal in morphology.  Left ovary  Measurements: 4.1 x 2.5 x 3.8 cm. 2.3 x 2.7 x 2.0 cm solid, mildly hypervascular lesion within.  Other findings  Trace free pelvic fluid is likely physiologic.  IMPRESSION: 1. Left ovarian lesion which is likely a corpus luteal cyst. 2.  Trace free pelvic fluid is likely physiologic.   Electronically Signed   By: Jeronimo Greaves M.D.   On: 03/24/2014 10:01   US Pelvis Complete  03/24/2014   CLINICAL DATA:  Left lower quadrant pain for 2.5 weeks. Recent small left ovarian cyst.  EXAM: TRANSABDOMINAL AND TRANSVAGINAL ULTRASOUND OF PELVIS  TECHNIQUE: Both transabdominal and transvaginal ultrasound examinations of the pelvis were performed. Transabdominal technique was performed for global imaging of the pelvis including uterus, ovaries, adnexal regions, and pelvic cul-de-sac. It was necessary to proceed with endovaginal exam following the transabdominal exam to visualize the uterus, ovaries, and adnexa.  COMPARISON:  03/05/2014   FINDINGS: Uterus  Measurements: 7.4 x 3.5 x 4.8 cm. Normal in morphology.  Endometrium  Thickness: 8 mm.  No focal abnormality visualized.  Right ovary  Measurements: 2.8 x 1.9 x 2.1 cm. Normal in morphology.  Left ovary  Measurements: 4.1 x 2.5 x 3.8 cm. 2.3 x 2.7 x 2.0 cm solid, mildly hypervascular lesion within.  Other findings  Trace free pelvic fluid is likely physiologic.  IMPRESSION: 1. Left ovarian lesion which is likely a corpus luteal cyst. 2.  Trace free pelvic fluid is likely physiologic.   Electronically Signed   By: Jeronimo Greaves M.D.   On: 03/24/2014 10:01     EKG Interpretation None      MDM   Final diagnoses:  Cyst of left ovary  BV (bacterial vaginosis)  Vaginal discharge   No change in cyst. Pt given zithromax and rocephin for discharge. Nitrite positive urine although think false positive from the azo.  Urine sent for culture. Will send home with oxycodone and flagyl for bv. Pt encouraged on follow up with gyn     Teressa LowerVrinda Pickering, NP 03/24/14 1100

## 2014-03-24 NOTE — Discharge Instructions (Signed)
Bacterial Vaginosis Bacterial vaginosis is a vaginal infection that occurs when the normal balance of bacteria in the vagina is disrupted. It results from an overgrowth of certain bacteria. This is the most common vaginal infection in women of childbearing age. Treatment is important to prevent complications, especially in pregnant women, as it can cause a premature delivery. CAUSES  Bacterial vaginosis is caused by an increase in harmful bacteria that are normally present in smaller amounts in the vagina. Several different kinds of bacteria can cause bacterial vaginosis. However, the reason that the condition develops is not fully understood. RISK FACTORS Certain activities or behaviors can put you at an increased risk of developing bacterial vaginosis, including:  Having a new sex partner or multiple sex partners.  Douching.  Using an intrauterine device (IUD) for contraception. Women do not get bacterial vaginosis from toilet seats, bedding, swimming pools, or contact with objects around them. SIGNS AND SYMPTOMS  Some women with bacterial vaginosis have no signs or symptoms. Common symptoms include:  Grey vaginal discharge.  A fishlike odor with discharge, especially after sexual intercourse.  Itching or burning of the vagina and vulva.  Burning or pain with urination. DIAGNOSIS  Your health care provider will take a medical history and examine the vagina for signs of bacterial vaginosis. A sample of vaginal fluid may be taken. Your health care provider will look at this sample under a microscope to check for bacteria and abnormal cells. A vaginal pH test may also be done.  TREATMENT  Bacterial vaginosis may be treated with antibiotic medicines. These may be given in the form of a pill or a vaginal cream. A second round of antibiotics may be prescribed if the condition comes back after treatment.  HOME CARE INSTRUCTIONS   Only take over-the-counter or prescription medicines as  directed by your health care provider.  If antibiotic medicine was prescribed, take it as directed. Make sure you finish it even if you start to feel better.  Do not have sex until treatment is completed.  Tell all sexual partners that you have a vaginal infection. They should see their health care provider and be treated if they have problems, such as a mild rash or itching.  Practice safe sex by using condoms and only having one sex partner. SEEK MEDICAL CARE IF:   Your symptoms are not improving after 3 days of treatment.  You have increased discharge or pain.  You have a fever. MAKE SURE YOU:   Understand these instructions.  Will watch your condition.  Will get help right away if you are not doing well or get worse. FOR MORE INFORMATION  Centers for Disease Control and Prevention, Division of STD Prevention: SolutionApps.co.zawww.cdc.gov/std American Sexual Health Association (ASHA): www.ashastd.org  Document Released: 09/12/2005 Document Revised: 07/03/2013 Document Reviewed: 04/24/2013 [facility] Patient Information 2015 [place], [place]. This information is not intended to replace advice given to you by your health care provider. Make sure you discuss any questions you have with your health care provider.  Ovarian Cyst An ovarian cyst is a sac filled with fluid or blood. This sac is attached to the ovary. Some cysts go away on their own. Other cysts need treatment.  HOME CARE   Only take medicine as told by your doctor.  Follow up with your doctor as told.  Get regular pelvic exams and Pap tests. GET HELP IF:  Your periods are late, not regular, or painful.  You stop having periods.  Your belly (abdominal) or pelvic pain  does not go away.  Your belly becomes large or puffy (swollen).  You have a hard time peeing (totally emptying your bladder).  You have pressure on your bladder.  You have pain during sex.  You feel fullness, pressure, or discomfort in your belly.  You lose  weight for no reason.  You feel sick most of the time.  You have a hard time pooping (constipation).  You do not feel like eating.  You develop pimples (acne).  You have an increase in hair on your body and face.  You are gaining weight for no reason.  You think you are pregnant. GET HELP RIGHT AWAY IF:   Your belly pain gets worse.  You feel sick to your stomach (nauseous), and you throw up (vomit).  You have a fever that comes on fast.  You have belly pain while pooping (bowel movement).  Your periods are heavier than usual. MAKE SURE YOU:   Understand these instructions.  Will watch your condition.  Will get help right away if you are not doing well or get worse. Document Released: 02/29/2008 Document Revised: 07/03/2013 Document Reviewed: 05/20/2013 [facility] Patient Information 2015 [place], [place]. This information is not intended to replace advice given to you by your health care provider. Make sure you discuss any questions you have with your health care provider.

## 2014-03-24 NOTE — ED Provider Notes (Signed)
Medical screening examination/treatment/procedure(s) were conducted as a shared visit with non-physician practitioner(s) and myself.  I personally evaluated the patient during the encounter.   EKG Interpretation None     Pt seen and evaluated,  Pt is comfortable, no acute distress, normal respiratory effort.    Ethelda ChickMartha K Linker, MD 03/24/14 [phone]

## 2014-03-24 NOTE — ED Notes (Signed)
Pelvic cart at bedside. 

## 2014-03-25 LAB — URINE CULTURE: Colony Count: 9000

## 2014-03-25 LAB — GC/CHLAMYDIA PROBE AMP
CT Probe RNA: NEGATIVE
GC PROBE AMP APTIMA: NEGATIVE

## 2014-04-19 ENCOUNTER — Emergency Department (HOSPITAL_BASED_OUTPATIENT_CLINIC_OR_DEPARTMENT_OTHER)
Admission: EM | Admit: 2014-04-19 | Discharge: 2014-04-19 | Disposition: A | Discharge status: Home / Self Care | Payer: Managed Care, Other (non HMO) | Attending: Emergency Medicine | Admitting: Emergency Medicine

## 2014-04-19 ENCOUNTER — Encounter (HOSPITAL_BASED_OUTPATIENT_CLINIC_OR_DEPARTMENT_OTHER): Payer: Self-pay | Admitting: Emergency Medicine

## 2014-04-19 DIAGNOSIS — Z792 Long term (current) use of antibiotics: Secondary | ICD-10-CM | POA: Insufficient documentation

## 2014-04-19 DIAGNOSIS — Z79899 Other long term (current) drug therapy: Secondary | ICD-10-CM | POA: Insufficient documentation

## 2014-04-19 DIAGNOSIS — Z8639 Personal history of other endocrine, nutritional and metabolic disease: Secondary | ICD-10-CM | POA: Insufficient documentation

## 2014-04-19 DIAGNOSIS — R1110 Vomiting, unspecified: Secondary | ICD-10-CM | POA: Insufficient documentation

## 2014-04-19 DIAGNOSIS — Z791 Long term (current) use of non-steroidal anti-inflammatories (NSAID): Secondary | ICD-10-CM | POA: Insufficient documentation

## 2014-04-19 DIAGNOSIS — R102 Pelvic and perineal pain: Secondary | ICD-10-CM

## 2014-04-19 DIAGNOSIS — N949 Unspecified condition associated with female genital organs and menstrual cycle: Secondary | ICD-10-CM | POA: Insufficient documentation

## 2014-04-19 DIAGNOSIS — R109 Unspecified abdominal pain: Secondary | ICD-10-CM | POA: Insufficient documentation

## 2014-04-19 DIAGNOSIS — Z3202 Encounter for pregnancy test, result negative: Secondary | ICD-10-CM | POA: Insufficient documentation

## 2014-04-19 DIAGNOSIS — Z862 Personal history of diseases of the blood and blood-forming organs and certain disorders involving the immune mechanism: Secondary | ICD-10-CM | POA: Insufficient documentation

## 2014-04-19 LAB — PREGNANCY, URINE: Preg Test, Ur: NEGATIVE

## 2014-04-19 LAB — WET PREP, GENITAL
Trich, Wet Prep: NONE SEEN
Yeast Wet Prep HPF POC: NONE SEEN

## 2014-04-19 LAB — URINALYSIS, ROUTINE W REFLEX MICROSCOPIC
BILIRUBIN URINE: NEGATIVE
Glucose, UA: NEGATIVE mg/dL
Hgb urine dipstick: NEGATIVE
KETONES UR: NEGATIVE mg/dL
Leukocytes, UA: NEGATIVE
NITRITE: NEGATIVE
Protein, ur: NEGATIVE mg/dL
SPECIFIC GRAVITY, URINE: 1.026 (ref 1.005–1.030)
Urobilinogen, UA: 1 mg/dL (ref 0.0–1.0)
pH: 6.5 (ref 5.0–8.0)

## 2014-04-19 MED ORDER — HYDROCODONE-ACETAMINOPHEN 5-325 MG PO TABS: 1.0000 | ORAL_TABLET | ORAL | Status: DC | PRN

## 2014-04-19 NOTE — ED Provider Notes (Addendum)
CSN: [id number]     Arrival date & time 04/19/14  0744 History   First MD Initiated Contact with Patient 04/19/14 [phone]     Chief Complaint  Patient presents with  . Flank Pain     (Consider location/radiation/quality/duration/timing/severity/associated sxs/prior Treatment) HPI Complains of left suprapubic pain radiating to left flank onset 2 days ago. Typical of the pain that she gets a month from ovarian cysts for the past 10 years around the time of her menstrual period. Last normal menstrual period 03/16/2014. He has treated herself with ibuprofen and with hydrocodone, without adequate pain relief. She vomited 4 or 5 times this morning. Treated himself with Phenergan, no nausea at present. She gets similar pain every month. Stating it last for 5 days and her gynecologist, Dr.Patel states that "she just has to get through it. She reports she had an pelvic ultrasound in his office 1.5 weeks ago for routine followup, which showed a 5 cm ovarian cyst on the right, no cysts on the left. Patient also had pelvic ultrasound here 03/24/2014 which showed left corpus luteum cyst. No fever. No other associated symptoms. Nothing makes symptoms better or worse. No vaginal discharge. Past Medical History  Diagnosis Date  . Polycystic ovary disease   . Ovarian cyst rupture    Past Surgical History  Procedure Laterality Date  . Tossilectomy    . Tonsillectomy     Family History  Problem Relation Age of Onset  . Diabetes Sister   . Diabetes Maternal Grandfather   . Hypertension Maternal Grandfather   . Hypertension Paternal Grandmother   . Heart attack Neg Hx    History  Substance Use Topics  . Smoking status: Never Smoker   . Smokeless tobacco: Never Used  . Alcohol Use: Yes     Comment: occasional   OB History   Grav Para Term Preterm Abortions TAB SAB Ect Mult Living   1    1  1         Review of Systems  Constitutional: Negative.   HENT: Negative.   Respiratory: Negative.    Cardiovascular: Negative.   Gastrointestinal: Positive for vomiting.  Genitourinary: Positive for flank pain.       Pelvic pain  Musculoskeletal: Negative.   Skin: Negative.   Neurological: Negative.   Psychiatric/Behavioral: Negative.   All other systems reviewed and are negative.     Allergies  Review of patient's allergies indicates no known allergies.  Home Medications   Prior to Admission medications   Medication Sig Start Date End Date Taking? Authorizing Provider  HYDROcodone-acetaminophen (NORCO/VICODIN) 5-325 MG per tablet Take 2 tablets by mouth every 4 (four) hours as needed. 03/14/14   Elson Areas, PA-C  ibuprofen (ADVIL,MOTRIN) 600 MG tablet Take 1 tablet (600 mg total) by mouth every 6 (six) hours as needed. 12/31/13   Shari A Upstill, PA-C  ibuprofen (ADVIL,MOTRIN) 800 MG tablet Take 1 tablet (800 mg total) by mouth 3 (three) times daily. 03/14/14   Elson Areas, PA-C  metroNIDAZOLE (FLAGYL) 500 MG tablet Take 1 tablet (500 mg total) by mouth 2 (two) times daily. 03/24/14   Teressa Lower, NP  norgestimate-ethinyl estradiol (ORTHO-CYCLEN,SPRINTEC,PREVIFEM) 0.25-35 MG-MCG tablet Take 1 tablet by mouth daily. 01/28/14   Willodean Rosenthal, MD  oxyCODONE-acetaminophen (PERCOCET/ROXICET) 5-325 MG per tablet Take 2 tablets by mouth every 4 (four) hours as needed for severe pain. 02/28/14   Elson Areas, PA-C  oxyCODONE-acetaminophen (PERCOCET/ROXICET) 5-325 MG per tablet Take 1-2 tablets by mouth every  6 (six) hours as needed for moderate pain or severe pain. 03/24/14   Teressa LowerVrinda Pickering, NP   BP 114/76  Pulse 90  Temp(Src) 99 F (37.2 C) (Oral)  Resp 18  Ht 5\' 2"  (1.575 m)  Wt 120 lb (54.432 kg)  BMI 21.94 kg/m2  SpO2 100%  LMP 02/17/2014 Physical Exam  Nursing note and vitals reviewed. Constitutional: She appears well-developed and well-nourished.  HENT:  Head: Normocephalic and atraumatic.  Eyes: Conjunctivae are normal. Pupils are equal, round, and  reactive to light.  Neck: Neck supple. No tracheal deviation present. No thyromegaly present.  Cardiovascular: Normal rate and regular rhythm.   No murmur heard. Pulmonary/Chest: Effort normal and breath sounds normal.  Abdominal: Soft. Bowel sounds are normal. She exhibits no distension. There is tenderness.  Mild left suprapubic tenderness  Genitourinary:  No external lesion. White discharge. Cervical os closed. No cervical motion tenderness mild bilateral adnexal tenderness. Mild left flank tenderness  Musculoskeletal: Normal range of motion. She exhibits no edema and no tenderness.  Neurological: She is alert. Coordination normal.  Skin: Skin is warm and dry. No rash noted.  Psychiatric: She has a normal mood and affect.   Results for orders placed during the hospital encounter of 04/19/14  WET PREP, GENITAL      Result Value Ref Range   Yeast Wet Prep HPF POC NONE SEEN  NONE SEEN   Trich, Wet Prep NONE SEEN  NONE SEEN   Clue Cells Wet Prep HPF POC FEW (*) NONE SEEN   WBC, Wet Prep HPF POC FEW (*) NONE SEEN  URINALYSIS, ROUTINE W REFLEX MICROSCOPIC      Result Value Ref Range   Color, Urine YELLOW  YELLOW   APPearance CLOUDY (*) CLEAR   Specific Gravity, Urine 1.026  1.005 - 1.030   pH 6.5  5.0 - 8.0   Glucose, UA NEGATIVE  NEGATIVE mg/dL   Hgb urine dipstick NEGATIVE  NEGATIVE   Bilirubin Urine NEGATIVE  NEGATIVE   Ketones, ur NEGATIVE  NEGATIVE mg/dL   Protein, ur NEGATIVE  NEGATIVE mg/dL   Urobilinogen, UA 1.0  0.0 - 1.0 mg/dL   Nitrite NEGATIVE  NEGATIVE   Leukocytes, UA NEGATIVE  NEGATIVE  PREGNANCY, URINE      Result Value Ref Range   Preg Test, Ur NEGATIVE  NEGATIVE   [place] Transvaginal Non-ob  03/24/2014   CLINICAL DATA:  Left lower quadrant pain for 2.5 weeks. Recent small left ovarian cyst.  EXAM: TRANSABDOMINAL AND TRANSVAGINAL ULTRASOUND OF PELVIS  TECHNIQUE: Both transabdominal and transvaginal ultrasound examinations of the pelvis were performed. Transabdominal  technique was performed for global imaging of the pelvis including uterus, ovaries, adnexal regions, and pelvic cul-de-sac. It was necessary to proceed with endovaginal exam following the transabdominal exam to visualize the uterus, ovaries, and adnexa.  COMPARISON:  03/05/2014  FINDINGS: Uterus  Measurements: 7.4 x 3.5 x 4.8 cm. Normal in morphology.  Endometrium  Thickness: 8 mm.  No focal abnormality visualized.  Right ovary  Measurements: 2.8 x 1.9 x 2.1 cm. Normal in morphology.  Left ovary  Measurements: 4.1 x 2.5 x 3.8 cm. 2.3 x 2.7 x 2.0 cm solid, mildly hypervascular lesion within.  Other findings  Trace free pelvic fluid is likely physiologic.  IMPRESSION: 1. Left ovarian lesion which is likely a corpus luteal cyst. 2.  Trace free pelvic fluid is likely physiologic.   Electronically Signed   By: Jeronimo GreavesKyle  Talbot M.D.   On: 03/24/2014 10:01   [place]  Pelvis Complete  03/24/2014   CLINICAL DATA:  Left lower quadrant pain for 2.5 weeks. Recent small left ovarian cyst.  EXAM: TRANSABDOMINAL AND TRANSVAGINAL ULTRASOUND OF PELVIS  TECHNIQUE: Both transabdominal and transvaginal ultrasound examinations of the pelvis were performed. Transabdominal technique was performed for global imaging of the pelvis including uterus, ovaries, adnexal regions, and pelvic cul-de-sac. It was necessary to proceed with endovaginal exam following the transabdominal exam to visualize the uterus, ovaries, and adnexa.  COMPARISON:  03/05/2014  FINDINGS: Uterus  Measurements: 7.4 x 3.5 x 4.8 cm. Normal in morphology.  Endometrium  Thickness: 8 mm.  No focal abnormality visualized.  Right ovary  Measurements: 2.8 x 1.9 x 2.1 cm. Normal in morphology.  Left ovary  Measurements: 4.1 x 2.5 x 3.8 cm. 2.3 x 2.7 x 2.0 cm solid, mildly hypervascular lesion within.  Other findings  Trace free pelvic fluid is likely physiologic.  IMPRESSION: 1. Left ovarian lesion which is likely a corpus luteal cyst. 2.  Trace free pelvic fluid is likely physiologic.    Electronically Signed   By: Jeronimo Greaves M.D.   On: 03/24/2014 10:01     ED Course  Procedures (including critical care time) Labs Review Labs Reviewed  GC/CHLAMYDIA PROBE AMP  WET PREP, GENITAL  URINALYSIS, ROUTINE W REFLEX MICROSCOPIC  PREGNANCY, URINE    Imaging Review No results found.   EKG Interpretation None     9 AM patient resting comfortably. MDM  Plan patient has prescriptions for Phenergan, ibuprofen 800 mg and hydrocodone at home. She is instructed to followup with Dr.Patel I don't feel the patient has need for repeat ultrasound. She had one 1.5 weeks ago and has had multiple pelvic ultrasound here. Patient had HIV test here April 2015 which was negative Final diagnoses:  None   Diagnosis #1 pelvic pain #2 right flank pain     Doug Sou, MD 04/19/14 [phone]  Addendum patient reports she has only 2 hydrocodone left. I will prescribe additional 8 tablets of Knox Saliva, MD 04/19/14 [phone]

## 2014-04-19 NOTE — Discharge Instructions (Signed)
Take your Phenergan, hydrocodone, and ibuprofen as needed for nausea and pain. Call Dr.Patel's office in 2 days to let him know that you were here. He may want to see you again to discuss other options for treating your pelvic pain

## 2014-04-19 NOTE — ED Notes (Signed)
Patient c/o L side flank pain that has grown worse over the past two days, n/v, taking phenergan

## 2014-04-21 LAB — GC/CHLAMYDIA PROBE AMP
CT PROBE, AMP APTIMA: NEGATIVE
GC Probe RNA: NEGATIVE

## 2014-05-09 ENCOUNTER — Emergency Department (HOSPITAL_BASED_OUTPATIENT_CLINIC_OR_DEPARTMENT_OTHER)
Admission: EM | Admit: 2014-05-09 | Discharge: 2014-05-09 | Disposition: A | Discharge status: Home / Self Care | Payer: Managed Care, Other (non HMO) | Attending: Emergency Medicine | Admitting: Emergency Medicine

## 2014-05-09 ENCOUNTER — Encounter (HOSPITAL_BASED_OUTPATIENT_CLINIC_OR_DEPARTMENT_OTHER): Payer: Self-pay | Admitting: Emergency Medicine

## 2014-05-09 DIAGNOSIS — N83202 Unspecified ovarian cyst, left side: Secondary | ICD-10-CM

## 2014-05-09 DIAGNOSIS — Z8639 Personal history of other endocrine, nutritional and metabolic disease: Secondary | ICD-10-CM | POA: Insufficient documentation

## 2014-05-09 DIAGNOSIS — Z3202 Encounter for pregnancy test, result negative: Secondary | ICD-10-CM | POA: Insufficient documentation

## 2014-05-09 DIAGNOSIS — Z862 Personal history of diseases of the blood and blood-forming organs and certain disorders involving the immune mechanism: Secondary | ICD-10-CM | POA: Insufficient documentation

## 2014-05-09 DIAGNOSIS — Z791 Long term (current) use of non-steroidal anti-inflammatories (NSAID): Secondary | ICD-10-CM | POA: Insufficient documentation

## 2014-05-09 DIAGNOSIS — R109 Unspecified abdominal pain: Secondary | ICD-10-CM | POA: Insufficient documentation

## 2014-05-09 DIAGNOSIS — N83209 Unspecified ovarian cyst, unspecified side: Secondary | ICD-10-CM | POA: Insufficient documentation

## 2014-05-09 DIAGNOSIS — Z79899 Other long term (current) drug therapy: Secondary | ICD-10-CM | POA: Insufficient documentation

## 2014-05-09 LAB — URINALYSIS, ROUTINE W REFLEX MICROSCOPIC
Bilirubin Urine: NEGATIVE
Glucose, UA: NEGATIVE mg/dL
Hgb urine dipstick: NEGATIVE
Ketones, ur: NEGATIVE mg/dL
LEUKOCYTES UA: NEGATIVE
NITRITE: NEGATIVE
Protein, ur: NEGATIVE mg/dL
Specific Gravity, Urine: 1.007 (ref 1.005–1.030)
Urobilinogen, UA: 0.2 mg/dL (ref 0.0–1.0)
pH: 7 (ref 5.0–8.0)

## 2014-05-09 LAB — PREGNANCY, URINE: PREG TEST UR: NEGATIVE

## 2014-05-09 MED ORDER — KETOROLAC TROMETHAMINE 60 MG/2ML IM SOLN
60.0000 mg | Freq: Once | INTRAMUSCULAR | Status: AC
  Administered 2014-05-09: 60 mg via INTRAMUSCULAR
  Filled 2014-05-09: qty 2

## 2014-05-09 NOTE — ED Notes (Signed)
Pt reports that she was seen at her obgyn on tue, diagnosed with ovarian cyst, was given vicodin ran out of pain medication this am and pain returned

## 2014-05-09 NOTE — ED Provider Notes (Addendum)
CSN: [id number]     Arrival date & time 05/09/14  2012 History  This chart was scribed for Hilario Quarry, MD by Julian Hy, ED Scribe. The patient was seen in [facility]. The patient's care was started at 8:43 PM.     Chief Complaint  Patient presents with  . Abdominal Pain   Patient is a 23 y.o. female presenting with abdominal pain. The history is provided by the patient. No language interpreter was used.  Abdominal Pain Pain radiates to:  Does not radiate Pain severity:  Moderate Duration:  3 days Timing:  Constant Progression:  Worsening Associated symptoms: no nausea, no vaginal discharge and no vomiting    HPI Comments: Amanda Vazquez is a 23 y.o. female who presents to the Emergency Department complaining of new, constant, gradually worsening abdominal pain onset 3 days ago. Pt reports she saw her OB/GYN 3 days ago and was diagnosed with an ovarian cyst that was 3 cm on her left ovary. Pt reports she was given Vicodin and noticed relief. Pt reports she ran out of Vicodin this morning and states her pain has since returned. Pt also reports she has been taking ibuprofen.  Pt reports she takes birth control. Pt denies possible pregnancy. Pt states she had abnormal vaginal bleeding that has since been resolved.States negative pregnancy test on Tuesday. Pt denies any other medical issues.   Past Medical History  Diagnosis Date  . Polycystic ovary disease   . Ovarian cyst rupture    Past Surgical History  Procedure Laterality Date  . Tossilectomy    . Tonsillectomy     Family History  Problem Relation Age of Onset  . Diabetes Sister   . Diabetes Maternal Grandfather   . Hypertension Maternal Grandfather   . Hypertension Paternal Grandmother   . Heart attack Neg Hx    History  Substance Use Topics  . Smoking status: Never Smoker   . Smokeless tobacco: Never Used  . Alcohol Use: Yes     Comment: occasional   OB History   Grav Para Term Preterm Abortions TAB SAB Ect  Mult Living   1    1  1         Review of Systems  Gastrointestinal: Positive for abdominal pain. Negative for nausea and vomiting.  Genitourinary: Negative for vaginal discharge.  All other systems reviewed and are negative.     Allergies  Review of patient's allergies indicates no known allergies.  Home Medications   Prior to Admission medications   Medication Sig Start Date End Date Taking? Authorizing Provider  HYDROcodone-acetaminophen (NORCO) 5-325 MG per tablet Take 1-2 tablets by mouth every 4 (four) hours as needed. 04/19/14   Doug Sou, MD  HYDROcodone-acetaminophen (NORCO/VICODIN) 5-325 MG per tablet Take 2 tablets by mouth every 4 (four) hours as needed. 03/14/14   Elson Areas, PA-C  ibuprofen (ADVIL,MOTRIN) 600 MG tablet Take 1 tablet (600 mg total) by mouth every 6 (six) hours as needed. 12/31/13   Shari A Upstill, PA-C  ibuprofen (ADVIL,MOTRIN) 800 MG tablet Take 1 tablet (800 mg total) by mouth 3 (three) times daily. 03/14/14   Elson Areas, PA-C  metroNIDAZOLE (FLAGYL) 500 MG tablet Take 1 tablet (500 mg total) by mouth 2 (two) times daily. 03/24/14   Teressa Lower, NP  norgestimate-ethinyl estradiol (ORTHO-CYCLEN,SPRINTEC,PREVIFEM) 0.25-35 MG-MCG tablet Take 1 tablet by mouth daily. 01/28/14   Willodean Rosenthal, MD  oxyCODONE-acetaminophen (PERCOCET/ROXICET) 5-325 MG per tablet Take 2 tablets by mouth every 4 (four)  hours as needed for severe pain. 02/28/14   Elson AreasLeslie K Sofia, PA-C  oxyCODONE-acetaminophen (PERCOCET/ROXICET) 5-325 MG per tablet Take 1-2 tablets by mouth every 6 (six) hours as needed for moderate pain or severe pain. 03/24/14   Teressa LowerVrinda Pickering, NP   Triage Vitals: BP 136/77  Pulse 67  Temp(Src) 98.1 F (36.7 C) (Oral)  Resp 18  Ht 5\' 2"  (1.575 m)  Wt 120 lb (54.432 kg)  BMI 21.94 kg/m2  SpO2 100%  LMP 05/08/2014 Physical Exam  Nursing note and vitals reviewed. Constitutional: She is oriented to person, place, and time. She appears  well-developed and well-nourished.  Eyes: EOM are normal.  Neck: Neck supple.  Cardiovascular: Normal rate.   Pulmonary/Chest: Effort normal.  Abdominal: Soft. There is no tenderness.  Musculoskeletal: Normal range of motion.  Neurological: She is alert and oriented to person, place, and time. No cranial nerve deficit.  Skin: Skin is warm and dry.    ED Course  Procedures (including critical care time) DIAGNOSTIC STUDIES: Oxygen Saturation is 100% on RA, normal by my interpretation.    COORDINATION OF CARE: 8:47 PM- Will order Toradol 60 mg injection. Patient informed of current plan for treatment and evaluation and agrees with plan at this time.  Labs Review Labs Reviewed  URINALYSIS, ROUTINE W REFLEX MICROSCOPIC  PREGNANCY, URINE    MDM  Patient noted to have 19 prescriptions in [place] controlled substance data bank for narcotics in the past six months. Final diagnoses:  Left ovarian cyst    I personally performed the services described in this documentation, which was scribed in my presence. The recorded information has been reviewed and considered.    Hilario Quarryanielle S Clevester Helzer, MD 05/09/14 [phone]  Hilario Quarryanielle S Fontaine Hehl, MD 05/09/14 2053

## 2014-05-09 NOTE — ED Notes (Signed)
States she was told she had a 3cm ovarian cyst, has been bleeding for  3 months, reports that bleeding stopped yesterday, has continued taking birth control as prescribed.

## 2014-05-09 NOTE — Discharge Instructions (Signed)
Ovarian Cyst An ovarian cyst is a fluid-filled sac that forms on an ovary. The ovaries are small organs that produce eggs in women. Various types of cysts can form on the ovaries. Most are not cancerous. Many do not cause problems, and they often go away on their own. Some may cause symptoms and require treatment. Common types of ovarian cysts include:  Functional cysts--These cysts may occur every month during the menstrual cycle. This is normal. The cysts usually go away with the next menstrual cycle if the woman does not get pregnant. Usually, there are no symptoms with a functional cyst.  Endometrioma cysts--These cysts form from the tissue that lines the uterus. They are also called "chocolate cysts" because they become filled with blood that turns brown. This type of cyst can cause pain in the lower abdomen during intercourse and with your menstrual period.  Cystadenoma cysts--This type develops from the cells on the outside of the ovary. These cysts can get very big and cause lower abdomen pain and pain with intercourse. This type of cyst can twist on itself, cut off its blood supply, and cause severe pain. It can also easily rupture and cause a lot of pain.  Dermoid cysts--This type of cyst is sometimes found in both ovaries. These cysts may contain different kinds of body tissue, such as skin, teeth, hair, or cartilage. They usually do not cause symptoms unless they get very big.  Theca lutein cysts--These cysts occur when too much of a certain hormone (human chorionic gonadotropin) is produced and overstimulates the ovaries to produce an egg. This is most common after procedures used to assist with the conception of a baby (in vitro fertilization). CAUSES   Fertility drugs can cause a condition in which multiple large cysts are formed on the ovaries. This is called ovarian hyperstimulation syndrome.  A condition called polycystic ovary syndrome can cause hormonal imbalances that can lead to  nonfunctional ovarian cysts. SIGNS AND SYMPTOMS  Many ovarian cysts do not cause symptoms. If symptoms are present, they may include:  Pelvic pain or pressure.  Pain in the lower abdomen.  Pain during sexual intercourse.  Increasing girth (swelling) of the abdomen.  Abnormal menstrual periods.  Increasing pain with menstrual periods.  Stopping having menstrual periods without being pregnant. DIAGNOSIS  These cysts are commonly found during a routine or annual pelvic exam. Tests may be ordered to find out more about the cyst. These tests may include:  Ultrasound.  X-Lissie Hinesley of the pelvis.  CT scan.  MRI.  Blood tests. TREATMENT  Many ovarian cysts go away on their own without treatment. Your health care provider may want to check your cyst regularly for 2-3 months to see if it changes. For women in menopause, it is particularly important to monitor a cyst closely because of the higher rate of ovarian cancer in menopausal women. When treatment is needed, it may include any of the following:  A procedure to drain the cyst (aspiration). This may be done using a long needle and ultrasound. It can also be done through a laparoscopic procedure. This involves using a thin, lighted tube with a tiny camera on the end (laparoscope) inserted through a small incision.  Surgery to remove the whole cyst. This may be done using laparoscopic surgery or an open surgery involving a larger incision in the lower abdomen.  Hormone treatment or birth control pills. These methods are sometimes used to help dissolve a cyst. HOME CARE INSTRUCTIONS   Only take over-the-counter   or prescription medicines as directed by your health care provider.  Follow up with your health care provider as directed.  Get regular pelvic exams and Pap tests. SEEK MEDICAL CARE IF:   Your periods are late, irregular, or painful, or they stop.  Your pelvic pain or abdominal pain does not go away.  Your abdomen becomes  larger or swollen.  You have pressure on your bladder or trouble emptying your bladder completely.  You have pain during sexual intercourse.  You have feelings of fullness, pressure, or discomfort in your stomach.  You lose weight for no apparent reason.  You feel generally ill.  You become constipated.  You lose your appetite.  You develop acne.  You have an increase in body and facial hair.  You are gaining weight, without changing your exercise and eating habits.  You think you are pregnant. SEEK IMMEDIATE MEDICAL CARE IF:   You have increasing abdominal pain.  You feel sick to your stomach (nauseous), and you throw up (vomit).  You develop a fever that comes on suddenly.  You have abdominal pain during a bowel movement.  Your menstrual periods become heavier than usual. MAKE SURE YOU:  Understand these instructions.  Will watch your condition.  Will get help right away if you are not doing well or get worse. Document Released: 09/12/2005 Document Revised: 09/17/2013 Document Reviewed: 05/20/2013 [employer] Patient Information 2015 [employer]. This information is not intended to replace advice given to you by your health care provider. Make sure you discuss any questions you have with your health care provider.  

## 2014-06-13 DIAGNOSIS — N809 Endometriosis, unspecified: Secondary | ICD-10-CM | POA: Insufficient documentation

## 2014-06-25 ENCOUNTER — Emergency Department (HOSPITAL_BASED_OUTPATIENT_CLINIC_OR_DEPARTMENT_OTHER): Payer: Managed Care, Other (non HMO)

## 2014-06-25 ENCOUNTER — Emergency Department (HOSPITAL_BASED_OUTPATIENT_CLINIC_OR_DEPARTMENT_OTHER)
Admission: EM | Admit: 2014-06-25 | Discharge: 2014-06-25 | Disposition: A | Discharge status: Home / Self Care | Payer: Managed Care, Other (non HMO) | Attending: Emergency Medicine | Admitting: Emergency Medicine

## 2014-06-25 ENCOUNTER — Encounter (HOSPITAL_BASED_OUTPATIENT_CLINIC_OR_DEPARTMENT_OTHER): Payer: Self-pay | Admitting: Emergency Medicine

## 2014-06-25 DIAGNOSIS — K5900 Constipation, unspecified: Secondary | ICD-10-CM

## 2014-06-25 DIAGNOSIS — Z8742 Personal history of other diseases of the female genital tract: Secondary | ICD-10-CM | POA: Insufficient documentation

## 2014-06-25 DIAGNOSIS — Z791 Long term (current) use of non-steroidal anti-inflammatories (NSAID): Secondary | ICD-10-CM | POA: Insufficient documentation

## 2014-06-25 DIAGNOSIS — R1084 Generalized abdominal pain: Secondary | ICD-10-CM | POA: Diagnosis present

## 2014-06-25 DIAGNOSIS — Z3202 Encounter for pregnancy test, result negative: Secondary | ICD-10-CM | POA: Diagnosis not present

## 2014-06-25 DIAGNOSIS — Z79899 Other long term (current) drug therapy: Secondary | ICD-10-CM | POA: Diagnosis not present

## 2014-06-25 LAB — URINE MICROSCOPIC-ADD ON

## 2014-06-25 LAB — URINALYSIS, ROUTINE W REFLEX MICROSCOPIC
Bilirubin Urine: NEGATIVE
Glucose, UA: NEGATIVE mg/dL
Ketones, ur: NEGATIVE mg/dL
Leukocytes, UA: NEGATIVE
Nitrite: NEGATIVE
PROTEIN: NEGATIVE mg/dL
Specific Gravity, Urine: 1.021 (ref 1.005–1.030)
UROBILINOGEN UA: 1 mg/dL (ref 0.0–1.0)
pH: 6 (ref 5.0–8.0)

## 2014-06-25 LAB — COMPREHENSIVE METABOLIC PANEL
ALT: 8 U/L (ref 0–35)
ANION GAP: 11 (ref 5–15)
AST: 10 U/L (ref 0–37)
Albumin: 3.9 g/dL (ref 3.5–5.2)
Alkaline Phosphatase: 37 U/L — ABNORMAL LOW (ref 39–117)
BILIRUBIN TOTAL: 0.3 mg/dL (ref 0.3–1.2)
BUN: 9 mg/dL (ref 6–23)
CO2: 26 meq/L (ref 19–32)
Calcium: 9.5 mg/dL (ref 8.4–10.5)
Chloride: 104 mEq/L (ref 96–112)
Creatinine, Ser: 0.8 mg/dL (ref 0.50–1.10)
GFR calc Af Amer: >90 mL/min (ref >90)
GLUCOSE: 105 mg/dL — AB (ref 70–99)
Potassium: 4.3 mEq/L (ref 3.7–5.3)
Sodium: 141 mEq/L (ref 137–147)
Total Protein: 6.9 g/dL (ref 6.0–8.3)

## 2014-06-25 LAB — CBC WITH DIFFERENTIAL/PLATELET
Basophils Absolute: 0 10*3/uL (ref 0.0–0.1)
Basophils Relative: 0 % (ref 0–1)
EOS ABS: 0.2 10*3/uL (ref 0.0–0.7)
Eosinophils Relative: 2 % (ref 0–5)
HCT: 37.3 % (ref 36.0–46.0)
HEMOGLOBIN: 13.1 g/dL (ref 12.0–15.0)
LYMPHS ABS: 1.9 10*3/uL (ref 0.7–4.0)
Lymphocytes Relative: 29 % (ref 12–46)
MCH: 31.6 pg (ref 26.0–34.0)
MCHC: 35.1 g/dL (ref 30.0–36.0)
MCV: 89.9 fL (ref 78.0–100.0)
MONOS PCT: 7 % (ref 3–12)
Monocytes Absolute: 0.5 10*3/uL (ref 0.1–1.0)
NEUTROS ABS: 4.1 10*3/uL (ref 1.7–7.7)
Neutrophils Relative %: 62 % (ref 43–77)
Platelets: 252 10*3/uL (ref 150–400)
RBC: 4.15 MIL/uL (ref 3.87–5.11)
RDW: 11.6 % (ref 11.5–15.5)
WBC: 6.7 10*3/uL (ref 4.0–10.5)

## 2014-06-25 LAB — PREGNANCY, URINE: Preg Test, Ur: NEGATIVE

## 2014-06-25 MED ORDER — ONDANSETRON HCL 4 MG/2ML IJ SOLN
4.0000 mg | Freq: Once | INTRAMUSCULAR | Status: DC
  Filled 2014-06-25: qty 2

## 2014-06-25 MED ORDER — IOHEXOL 300 MG/ML  SOLN
50.0000 mL | Freq: Once | INTRAMUSCULAR | Status: AC | PRN
  Administered 2014-06-25: 50 mL via ORAL

## 2014-06-25 MED ORDER — POLYETHYLENE GLYCOL 3350 17 G PO PACK: 17.0000 g | PACK | Freq: Every day | ORAL | Status: DC

## 2014-06-25 MED ORDER — ONDANSETRON 4 MG PO TBDP: 4.0000 mg | ORAL_TABLET | Freq: Three times a day (TID) | ORAL | Status: DC | PRN

## 2014-06-25 MED ORDER — ONDANSETRON HCL 4 MG/2ML IJ SOLN
4.0000 mg | Freq: Once | INTRAMUSCULAR | Status: AC
  Administered 2014-06-25: 4 mg via INTRAVENOUS

## 2014-06-25 MED ORDER — HYDROMORPHONE HCL 1 MG/ML IJ SOLN
1.0000 mg | Freq: Once | INTRAMUSCULAR | Status: AC
  Administered 2014-06-25: 1 mg via INTRAVENOUS
  Filled 2014-06-25: qty 1

## 2014-06-25 MED ORDER — SODIUM CHLORIDE 0.9 % IV SOLN
Freq: Once | INTRAVENOUS | Status: AC
  Administered 2014-06-25: 16:00:00 via INTRAVENOUS

## 2014-06-25 MED ORDER — IOHEXOL 300 MG/ML  SOLN
100.0000 mL | Freq: Once | INTRAMUSCULAR | Status: AC | PRN
  Administered 2014-06-25: 100 mL via INTRAVENOUS

## 2014-06-25 NOTE — Discharge Instructions (Signed)

## 2014-06-25 NOTE — ED Provider Notes (Signed)
CSN: [id number]     Arrival date & time 06/25/14  1422 History   First MD Initiated Contact with Patient 06/25/14 1506     Chief Complaint  Patient presents with  . Abdominal Pain     (Consider location/radiation/quality/duration/timing/severity/associated sxs/prior Treatment) Patient is a 23 y.o. female presenting with abdominal pain. The history is provided by the patient. No language interpreter was used.  Abdominal Pain Pain location:  Generalized Pain quality: aching and fullness   Pain radiates to:  Does not radiate Pain severity:  Moderate Onset quality:  Gradual Duration:  4 days Timing:  Constant Progression:  Worsening Chronicity:  New Context: previous surgery   Relieved by:  Nothing Worsened by:  Nothing tried Ineffective treatments:  None tried Associated symptoms: no anorexia   Risk factors: recent hospitalization   Risk factors: no alcohol abuse     Past Medical History  Diagnosis Date  . Polycystic ovary disease   . Ovarian cyst rupture    Past Surgical History  Procedure Laterality Date  . Tossilectomy    . Tonsillectomy     Family History  Problem Relation Age of Onset  . Diabetes Sister   . Diabetes Maternal Grandfather   . Hypertension Maternal Grandfather   . Hypertension Paternal Grandmother   . Heart attack Neg Hx    History  Substance Use Topics  . Smoking status: Never Smoker   . Smokeless tobacco: Never Used  . Alcohol Use: Yes     Comment: occasional   OB History   Grav Para Term Preterm Abortions TAB SAB Ect Mult Living   1    1  1         Review of Systems  Gastrointestinal: Positive for abdominal pain. Negative for anorexia.  All other systems reviewed and are negative.     Allergies  Review of patient's allergies indicates no known allergies.  Home Medications   Prior to Admission medications   Medication Sig Start Date End Date Taking? Authorizing Provider  HYDROcodone-acetaminophen (NORCO) 5-325 MG per tablet  Take 1-2 tablets by mouth every 4 (four) hours as needed. 04/19/14   Doug SouSam Jacubowitz, MD  HYDROcodone-acetaminophen (NORCO/VICODIN) 5-325 MG per tablet Take 2 tablets by mouth every 4 (four) hours as needed. 03/14/14   Elson AreasLeslie K Audi Wettstein, PA-C  ibuprofen (ADVIL,MOTRIN) 600 MG tablet Take 1 tablet (600 mg total) by mouth every 6 (six) hours as needed. 12/31/13   Shari A Upstill, PA-C  ibuprofen (ADVIL,MOTRIN) 800 MG tablet Take 1 tablet (800 mg total) by mouth 3 (three) times daily. 03/14/14   Elson AreasLeslie K Alli Jasmer, PA-C  metroNIDAZOLE (FLAGYL) 500 MG tablet Take 1 tablet (500 mg total) by mouth 2 (two) times daily. 03/24/14   Teressa LowerVrinda Pickering, NP  norgestimate-ethinyl estradiol (ORTHO-CYCLEN,SPRINTEC,PREVIFEM) 0.25-35 MG-MCG tablet Take 1 tablet by mouth daily. 01/28/14   Willodean Rosenthalarolyn Harraway-Smith, MD  oxyCODONE-acetaminophen (PERCOCET/ROXICET) 5-325 MG per tablet Take 2 tablets by mouth every 4 (four) hours as needed for severe pain. 02/28/14   Elson AreasLeslie K Oluwaseun Cremer, PA-C  oxyCODONE-acetaminophen (PERCOCET/ROXICET) 5-325 MG per tablet Take 1-2 tablets by mouth every 6 (six) hours as needed for moderate pain or severe pain. 03/24/14   Teressa LowerVrinda Pickering, NP   BP 114/67  Pulse 77  Temp(Src) 99 F (37.2 C) (Oral)  Resp 22  SpO2 99%  LMP 06/21/2014 Physical Exam  Nursing note and vitals reviewed. Constitutional: She is oriented to person, place, and time. She appears well-developed and well-nourished.  HENT:  Head: Normocephalic and atraumatic.  Eyes: EOM are normal. Pupils are equal, round, and reactive to light.  Neck: Normal range of motion.  Cardiovascular: Normal rate.   Pulmonary/Chest: Effort normal.  Abdominal: She exhibits no distension. There is tenderness.  Musculoskeletal: Normal range of motion.  Neurological: She is alert and oriented to person, place, and time.  Skin: Skin is warm.  Psychiatric: She has a normal mood and affect.    ED Course  Procedures (including critical care time) Labs Review Labs  Reviewed  URINALYSIS, ROUTINE W REFLEX MICROSCOPIC - Abnormal; Notable for the following:    APPearance CLOUDY (*)    Hgb urine dipstick SMALL (*)    All other components within normal limits  URINE MICROSCOPIC-ADD ON - Abnormal; Notable for the following:    Squamous Epithelial / LPF FEW (*)    Bacteria, UA FEW (*)    All other components within normal limits  COMPREHENSIVE METABOLIC PANEL - Abnormal; Notable for the following:    Glucose, Bld 105 (*)    Alkaline Phosphatase 37 (*)    All other components within normal limits  PREGNANCY, URINE  CBC WITH DIFFERENTIAL    Imaging Review No results found.   EKG Interpretation None      Results for orders placed during the hospital encounter of 06/25/14  URINALYSIS, ROUTINE W REFLEX MICROSCOPIC      Result Value Ref Range   Color, Urine YELLOW  YELLOW   APPearance CLOUDY (*) CLEAR   Specific Gravity, Urine 1.021  1.005 - 1.030   pH 6.0  5.0 - 8.0   Glucose, UA NEGATIVE  NEGATIVE mg/dL   Hgb urine dipstick SMALL (*) NEGATIVE   Bilirubin Urine NEGATIVE  NEGATIVE   Ketones, ur NEGATIVE  NEGATIVE mg/dL   Protein, ur NEGATIVE  NEGATIVE mg/dL   Urobilinogen, UA 1.0  0.0 - 1.0 mg/dL   Nitrite NEGATIVE  NEGATIVE   Leukocytes, UA NEGATIVE  NEGATIVE  PREGNANCY, URINE      Result Value Ref Range   Preg Test, Ur NEGATIVE  NEGATIVE  URINE MICROSCOPIC-ADD ON      Result Value Ref Range   Squamous Epithelial / LPF FEW (*) RARE   WBC, UA 0-2  <3 WBC/hpf   RBC / HPF 3-6  <3 RBC/hpf   Bacteria, UA FEW (*) RARE   Urine-Other MUCOUS PRESENT    CBC WITH DIFFERENTIAL      Result Value Ref Range   WBC 6.7  4.0 - 10.5 K/uL   RBC 4.15  3.87 - 5.11 MIL/uL   Hemoglobin 13.1  12.0 - 15.0 g/dL   HCT 16.1  09.6 - 04.5 %   MCV 89.9  78.0 - 100.0 fL   MCH 31.6  26.0 - 34.0 pg   MCHC 35.1  30.0 - 36.0 g/dL   RDW 40.9  81.1 - 91.4 %   Platelets 252  150 - 400 K/uL   Neutrophils Relative % 62  43 - 77 %   Neutro Abs 4.1  1.7 - 7.7 K/uL    Lymphocytes Relative 29  12 - 46 %   Lymphs Abs 1.9  0.7 - 4.0 K/uL   Monocytes Relative 7  3 - 12 %   Monocytes Absolute 0.5  0.1 - 1.0 K/uL   Eosinophils Relative 2  0 - 5 %   Eosinophils Absolute 0.2  0.0 - 0.7 K/uL   Basophils Relative 0  0 - 1 %   Basophils Absolute 0.0  0.0 - 0.1 K/uL  COMPREHENSIVE METABOLIC PANEL      Result Value Ref Range   Sodium 141  137 - 147 mEq/L   Potassium 4.3  3.7 - 5.3 mEq/L   Chloride 104  96 - 112 mEq/L   CO2 26  19 - 32 mEq/L   Glucose, Bld 105 (*) 70 - 99 mg/dL   BUN 9  6 - 23 mg/dL   Creatinine, Ser 4.09  0.50 - 1.10 mg/dL   Calcium 9.5  8.4 - 81.1 mg/dL   Total Protein 6.9  6.0 - 8.3 g/dL   Albumin 3.9  3.5 - 5.2 g/dL   AST 10  0 - 37 U/L   ALT 8  0 - 35 U/L   Alkaline Phosphatase 37 (*) 39 - 117 U/L   Total Bilirubin 0.3  0.3 - 1.2 mg/dL   GFR calc non Af Amer >90  >90 mL/min   GFR calc Af Amer >90  >90 mL/min   Anion gap 11  5 - 15   Ct Abdomen Pelvis W Contrast  06/25/2014   CLINICAL DATA:  Four-day history of left lower quadrant pain with nausea ; history of endometriosis  EXAM: CT ABDOMEN AND PELVIS WITH CONTRAST  TECHNIQUE: Multidetector CT imaging of the abdomen and pelvis was performed using the standard protocol following bolus administration of intravenous contrast.  CONTRAST:  50mL OMNIPAQUE IOHEXOL 300 MG/ML SOLN, OMNIPAQUE IOHEXOL 300 MG/ML SOLN  COMPARISON:  None.  FINDINGS: The liver, gallbladder, pancreas, spleen, adrenal glands, and kidneys are unremarkable. The caliber of the abdominal aorta is normal. There is no periaortic nor pericaval lymphadenopathy.  The stomach is moderately distended with contrast as well as food. The small bowel exhibits no evidence of ileus nor of obstruction. There is a moderately increased stool burden throughout the colon and rectum. There is slightly increased density within the pericolonic fat associated with the descending colon. There is no evidence of perforation or obstruction. There is  no abscess.  There is a small amount of free pelvic fluid. The uterus is normal in contour. No discrete adnexal mass is demonstrated. There is prominence of the ovaries bilaterally which likely contains cysts. There is no inguinal hernia nor umbilical. The partially distended urinary bladder is normal.  The lung bases are clear. The lumbar spine and bony pelvis are unremarkable.  IMPRESSION: 1. There is free pelvic fluid. There is poor definition of the ovaries without evidence of discrete masses. The uterus is grossly normal. Further evaluation with pelvic ultrasound may be useful. 2. Slightly increased density in the pericolonic fat surrounding the descending colonic region is noted. This is nonspecific. One cannot absolutely exclude low-grade colitis in the appropriate clinical setting. 3. There is increased stool burden throughout the colon which may reflect clinical constipation. 4. There is no acute hepatobiliary nor acute urinary tract abnormality.   Electronically Signed   By: David  [place]   On: 06/25/2014 18:23     MDM   Final diagnoses:  Constipation, unspecified constipation type    Miralax Pt advised to follow up with her Md for recheck as scheduled   Elson Areas, PA-C 06/25/14 1844

## 2014-06-25 NOTE — ED Notes (Signed)
Pt c/o lower left abd pain x 4 days Lap done 9/17 DX endometrioses.

## 2014-06-25 NOTE — ED Provider Notes (Signed)
Medical screening examination/treatment/procedure(s) were performed by non-physician practitioner and as supervising physician I was immediately available for consultation/collaboration.   EKG Interpretation None        Gilda Creasehristopher J. Pollina, MD 06/25/14 2233

## 2014-07-04 DIAGNOSIS — M545 Low back pain, unspecified: Secondary | ICD-10-CM | POA: Insufficient documentation

## 2014-07-16 ENCOUNTER — Telehealth: Payer: Self-pay

## 2014-07-16 NOTE — Telephone Encounter (Signed)
Patient has not yet brought in proof of income [facility]. Called patient. She states she has switched doctors and is no longer in need of Mirena. Application discarded.

## 2014-07-22 ENCOUNTER — Encounter (HOSPITAL_BASED_OUTPATIENT_CLINIC_OR_DEPARTMENT_OTHER): Payer: Self-pay | Admitting: Emergency Medicine

## 2014-07-22 ENCOUNTER — Emergency Department (HOSPITAL_BASED_OUTPATIENT_CLINIC_OR_DEPARTMENT_OTHER)
Admission: EM | Admit: 2014-07-22 | Discharge: 2014-07-23 | Disposition: A | Discharge status: Home / Self Care | Payer: Managed Care, Other (non HMO) | Attending: Emergency Medicine | Admitting: Emergency Medicine

## 2014-07-22 DIAGNOSIS — K5900 Constipation, unspecified: Secondary | ICD-10-CM | POA: Diagnosis not present

## 2014-07-22 DIAGNOSIS — Z791 Long term (current) use of non-steroidal anti-inflammatories (NSAID): Secondary | ICD-10-CM | POA: Insufficient documentation

## 2014-07-22 DIAGNOSIS — R1013 Epigastric pain: Secondary | ICD-10-CM | POA: Insufficient documentation

## 2014-07-22 DIAGNOSIS — Z3202 Encounter for pregnancy test, result negative: Secondary | ICD-10-CM | POA: Insufficient documentation

## 2014-07-22 DIAGNOSIS — R109 Unspecified abdominal pain: Secondary | ICD-10-CM

## 2014-07-22 DIAGNOSIS — Z79899 Other long term (current) drug therapy: Secondary | ICD-10-CM | POA: Insufficient documentation

## 2014-07-22 DIAGNOSIS — E282 Polycystic ovarian syndrome: Secondary | ICD-10-CM | POA: Insufficient documentation

## 2014-07-22 NOTE — ED Notes (Signed)
Pt states she is having abd pain since Saturday.  Started on left and now radiates to the right.

## 2014-07-22 NOTE — ED Notes (Signed)
MD at bedside. 

## 2014-07-22 NOTE — ED Provider Notes (Signed)
CSN: [id number]     Arrival date & time 07/22/14  2331 History   First MD Initiated Contact with Patient 07/22/14 2353     Chief Complaint  Patient presents with  . Abdominal Pain     (Consider location/radiation/quality/duration/timing/severity/associated sxs/prior Treatment) HPI This is a 23 year old female with a history of ovarian cysts. She has had multiple ED visits for abdominal pain. 3 days ago she had some epigastric pain associated with some constipation. She did not take a laxative. She subsequently moved her bowels and had relief of the epigastric pain. Since yesterday she has had pain in her right flank which she states is "not that bad" and she is only her make sure it is not something bad. She states it feels like a pulled muscle and is not like pain associated with her ovarian cysts. It is worse with movement or palpation. She denies fever, chills, vomiting or diarrhea. She has had nausea. She denies dysuria, hematuria, vaginal discharge or abnormal vaginal bleeding. She is currently on her menses. She states she has taken tramadol for her pain without relief.  Past Medical History  Diagnosis Date  . Polycystic ovary disease   . Ovarian cyst rupture    Past Surgical History  Procedure Laterality Date  . Tossilectomy    . Tonsillectomy     Family History  Problem Relation Age of Onset  . Diabetes Sister   . Diabetes Maternal Grandfather   . Hypertension Maternal Grandfather   . Hypertension Paternal Grandmother   . Heart attack Neg Hx    History  Substance Use Topics  . Smoking status: Never Smoker   . Smokeless tobacco: Never Used  . Alcohol Use: Yes     Comment: occasional   OB History   Grav Para Term Preterm Abortions TAB SAB Ect Mult Living   1    1  1         Review of Systems  All other systems reviewed and are negative.   Allergies  Review of patient's allergies indicates no known allergies.  Home Medications   Prior to Admission medications    Medication Sig Start Date End Date Taking? Authorizing Provider  traMADol (ULTRAM) 50 MG tablet Take 50 mg by mouth every 6 (six) hours as needed.   Yes Historical Provider, MD  HYDROcodone-acetaminophen (NORCO) 5-325 MG per tablet Take 1-2 tablets by mouth every 4 (four) hours as needed. 04/19/14   Doug Sou, MD  HYDROcodone-acetaminophen (NORCO/VICODIN) 5-325 MG per tablet Take 2 tablets by mouth every 4 (four) hours as needed. 03/14/14   Elson Areas, PA-C  ibuprofen (ADVIL,MOTRIN) 600 MG tablet Take 1 tablet (600 mg total) by mouth every 6 (six) hours as needed. 12/31/13   Shari A Upstill, PA-C  ibuprofen (ADVIL,MOTRIN) 800 MG tablet Take 1 tablet (800 mg total) by mouth 3 (three) times daily. 03/14/14   Elson Areas, PA-C  metroNIDAZOLE (FLAGYL) 500 MG tablet Take 1 tablet (500 mg total) by mouth 2 (two) times daily. 03/24/14   Teressa Lower, NP  norgestimate-ethinyl estradiol (ORTHO-CYCLEN,SPRINTEC,PREVIFEM) 0.25-35 MG-MCG tablet Take 1 tablet by mouth daily. 01/28/14   Willodean Rosenthal, MD  ondansetron (ZOFRAN ODT) 4 MG disintegrating tablet Take 1 tablet (4 mg total) by mouth every 8 (eight) hours as needed. 06/25/14   Elson Areas, PA-C  oxyCODONE-acetaminophen (PERCOCET/ROXICET) 5-325 MG per tablet Take 2 tablets by mouth every 4 (four) hours as needed for severe pain. 02/28/14   Elson Areas, PA-C  oxyCODONE-acetaminophen (  PERCOCET/ROXICET) 5-325 MG per tablet Take 1-2 tablets by mouth every 6 (six) hours as needed for moderate pain or severe pain. 03/24/14   Teressa LowerVrinda Pickering, NP  polyethylene glycol (MIRALAX) packet Take 17 g by mouth daily. 06/25/14   Elson AreasLeslie K Sofia, PA-C   BP 108/66  Pulse 65  Temp(Src) 98.6 F (37 C) (Oral)  Resp 18  Ht 5\' 2"  (1.575 m)  Wt 107 lb (48.535 kg)  BMI 19.57 kg/m2  SpO2 100%  LMP 07/20/2014  Physical Exam General: Well-developed, well-nourished female in no acute distress; appearance consistent with age of record HENT: normocephalic;  atraumatic Eyes: pupils equal, round and reactive to light; extraocular muscles intact Neck: supple Heart: regular rate and rhythm Lungs: clear to auscultation bilaterally Abdomen: soft; nondistended; mild epigastric tenderness; no masses or hepatosplenomegaly; bowel sounds present GU: Right CVA tenderness Extremities: No deformity; full range of motion; pulses normal Neurologic: Awake, alert and oriented; motor function intact in all extremities and symmetric; no facial droop Skin: Warm and dry Psychiatric: Normal mood and affect    ED Course  Procedures (including critical care time)   MDM   Nursing notes and vitals signs, including pulse oximetry, reviewed.  Summary of this visit's results, reviewed by myself:  Labs:  Results for orders placed during the hospital encounter of 07/22/14 (from the past 24 hour(s))  URINALYSIS, ROUTINE W REFLEX MICROSCOPIC     Status: Abnormal   Collection Time    07/22/14 11:46 PM      Result Value Ref Range   Color, Urine YELLOW  YELLOW   APPearance CLEAR  CLEAR   Specific Gravity, Urine 1.019  1.005 - 1.030   pH 5.5  5.0 - 8.0   Glucose, UA NEGATIVE  NEGATIVE mg/dL   Hgb urine dipstick SMALL (*) NEGATIVE   Bilirubin Urine NEGATIVE  NEGATIVE   Ketones, ur NEGATIVE  NEGATIVE mg/dL   Protein, ur NEGATIVE  NEGATIVE mg/dL   Urobilinogen, UA 1.0  0.0 - 1.0 mg/dL   Nitrite NEGATIVE  NEGATIVE   Leukocytes, UA NEGATIVE  NEGATIVE  PREGNANCY, URINE     Status: None   Collection Time    07/22/14 11:46 PM      Result Value Ref Range   Preg Test, Ur NEGATIVE  NEGATIVE  URINE RAPID DRUG SCREEN (HOSP PERFORMED)     Status: None   Collection Time    07/22/14 11:46 PM      Result Value Ref Range   Opiates NONE DETECTED  NONE DETECTED   Cocaine NONE DETECTED  NONE DETECTED   Benzodiazepines NONE DETECTED  NONE DETECTED   Amphetamines NONE DETECTED  NONE DETECTED   Tetrahydrocannabinol NONE DETECTED  NONE DETECTED   Barbiturates NONE DETECTED   NONE DETECTED  URINE MICROSCOPIC-ADD ON     Status: Abnormal   Collection Time    07/22/14 11:46 PM      Result Value Ref Range   Squamous Epithelial / LPF FEW (*) RARE   WBC, UA 0-2  <3 WBC/hpf   RBC / HPF 0-2  <3 RBC/hpf   Bacteria, UA RARE  RARE   Urine-Other MUCOUS PRESENT        According to the [facility] [place] [place] substance database the patient has received 34 prescriptions for narcotics in the past year from a total of 19 different prescribers. For the month of October so far she has received a total of 65 5 mg hydrocodone tablets, 15 5 mg oxycodone tablets and 30 50  mg tramadol tablets.      Hanley SeamenJohn L Keghan Mcfarren, MD 07/23/14 [phone]

## 2014-07-23 LAB — RAPID URINE DRUG SCREEN, HOSP PERFORMED
Amphetamines: NOT DETECTED
BARBITURATES: NOT DETECTED
Benzodiazepines: NOT DETECTED
Cocaine: NOT DETECTED
Opiates: NOT DETECTED
TETRAHYDROCANNABINOL: NOT DETECTED

## 2014-07-23 LAB — URINALYSIS, ROUTINE W REFLEX MICROSCOPIC
BILIRUBIN URINE: NEGATIVE
Glucose, UA: NEGATIVE mg/dL
KETONES UR: NEGATIVE mg/dL
Leukocytes, UA: NEGATIVE
NITRITE: NEGATIVE
Protein, ur: NEGATIVE mg/dL
Specific Gravity, Urine: 1.019 (ref 1.005–1.030)
Urobilinogen, UA: 1 mg/dL (ref 0.0–1.0)
pH: 5.5 (ref 5.0–8.0)

## 2014-07-23 LAB — URINE MICROSCOPIC-ADD ON

## 2014-07-23 LAB — PREGNANCY, URINE: Preg Test, Ur: NEGATIVE

## 2014-07-28 ENCOUNTER — Encounter (HOSPITAL_BASED_OUTPATIENT_CLINIC_OR_DEPARTMENT_OTHER): Payer: Self-pay | Admitting: Emergency Medicine

## 2014-08-28 ENCOUNTER — Emergency Department (HOSPITAL_BASED_OUTPATIENT_CLINIC_OR_DEPARTMENT_OTHER)
Admission: EM | Admit: 2014-08-28 | Discharge: 2014-08-28 | Disposition: A | Discharge status: Home / Self Care | Payer: Managed Care, Other (non HMO) | Attending: Emergency Medicine | Admitting: Emergency Medicine

## 2014-08-28 ENCOUNTER — Emergency Department (HOSPITAL_BASED_OUTPATIENT_CLINIC_OR_DEPARTMENT_OTHER): Payer: Managed Care, Other (non HMO)

## 2014-08-28 ENCOUNTER — Encounter (HOSPITAL_BASED_OUTPATIENT_CLINIC_OR_DEPARTMENT_OTHER): Payer: Self-pay | Admitting: *Deleted

## 2014-08-28 DIAGNOSIS — Y998 Other external cause status: Secondary | ICD-10-CM | POA: Insufficient documentation

## 2014-08-28 DIAGNOSIS — Z87448 Personal history of other diseases of urinary system: Secondary | ICD-10-CM | POA: Insufficient documentation

## 2014-08-28 DIAGNOSIS — Y9389 Activity, other specified: Secondary | ICD-10-CM | POA: Insufficient documentation

## 2014-08-28 DIAGNOSIS — S93402A Sprain of unspecified ligament of left ankle, initial encounter: Secondary | ICD-10-CM | POA: Insufficient documentation

## 2014-08-28 DIAGNOSIS — W19XXXA Unspecified fall, initial encounter: Secondary | ICD-10-CM

## 2014-08-28 DIAGNOSIS — Z8639 Personal history of other endocrine, nutritional and metabolic disease: Secondary | ICD-10-CM | POA: Insufficient documentation

## 2014-08-28 DIAGNOSIS — Z79899 Other long term (current) drug therapy: Secondary | ICD-10-CM | POA: Insufficient documentation

## 2014-08-28 DIAGNOSIS — Y929 Unspecified place or not applicable: Secondary | ICD-10-CM | POA: Insufficient documentation

## 2014-08-28 DIAGNOSIS — W108XXA Fall (on) (from) other stairs and steps, initial encounter: Secondary | ICD-10-CM | POA: Insufficient documentation

## 2014-08-28 MED ORDER — IBUPROFEN 800 MG PO TABS: 800.0000 mg | ORAL_TABLET | Freq: Three times a day (TID) | ORAL | Status: DC

## 2014-08-28 MED ORDER — HYDROCODONE-ACETAMINOPHEN 5-325 MG PO TABS
2.0000 | ORAL_TABLET | Freq: Once | ORAL | Status: AC
  Administered 2014-08-28: 2 via ORAL
  Filled 2014-08-28: qty 2

## 2014-08-28 NOTE — ED Provider Notes (Signed)
CSN: [id number]     Arrival date & time 08/28/14  1403 History   First MD Initiated Contact with Patient 08/28/14 1412     Chief Complaint  Patient presents with  . Fall     (Consider location/radiation/quality/duration/timing/severity/associated sxs/prior Treatment) HPI Comments: Patient presents to the emergency department with chief complaint of mechanical fall. She states that she fell down the stairs or carrying a laundry basket. She complains of pain in her left ankle and left forearm. She states is moderate to severe. It is worsened with movement. She has not taken anything to alleviate her symptoms. She denies any weakness or numbness.  The history is provided by the patient. No language interpreter was used.    Past Medical History  Diagnosis Date  . Polycystic ovary disease   . Ovarian cyst rupture    Past Surgical History  Procedure Laterality Date  . Tossilectomy    . Tonsillectomy     Family History  Problem Relation Age of Onset  . Diabetes Sister   . Diabetes Maternal Grandfather   . Hypertension Maternal Grandfather   . Hypertension Paternal Grandmother   . Heart attack Neg Hx    History  Substance Use Topics  . Smoking status: Never Smoker   . Smokeless tobacco: Never Used  . Alcohol Use: Yes     Comment: occasional   OB History    Gravida Para Term Preterm AB TAB SAB Ectopic Multiple Living   1    1  1         Review of Systems  Constitutional: Negative for fever and chills.  Respiratory: Negative for shortness of breath.   Cardiovascular: Negative for chest pain.  Gastrointestinal: Negative for nausea, vomiting, diarrhea and constipation.  Genitourinary: Negative for dysuria.  Musculoskeletal: Positive for arthralgias.      Allergies  Review of patient's allergies indicates no known allergies.  Home Medications   Prior to Admission medications   Medication Sig Start Date End Date Taking? Authorizing Provider  ketorolac (TORADOL) 10 MG  tablet Take 10 mg by mouth every 6 (six) hours as needed.   Yes Historical Provider, MD  HYDROcodone-acetaminophen (NORCO) 5-325 MG per tablet Take 1-2 tablets by mouth every 4 (four) hours as needed. 04/19/14   Doug SouSam Jacubowitz, MD  HYDROcodone-acetaminophen (NORCO/VICODIN) 5-325 MG per tablet Take 2 tablets by mouth every 4 (four) hours as needed. 03/14/14   Elson AreasLeslie K Sofia, PA-C  ibuprofen (ADVIL,MOTRIN) 600 MG tablet Take 1 tablet (600 mg total) by mouth every 6 (six) hours as needed. 12/31/13   Shari A Upstill, PA-C  ibuprofen (ADVIL,MOTRIN) 800 MG tablet Take 1 tablet (800 mg total) by mouth 3 (three) times daily. 03/14/14   Elson AreasLeslie K Sofia, PA-C  metroNIDAZOLE (FLAGYL) 500 MG tablet Take 1 tablet (500 mg total) by mouth 2 (two) times daily. 03/24/14   Teressa LowerVrinda Pickering, NP  norgestimate-ethinyl estradiol (ORTHO-CYCLEN,SPRINTEC,PREVIFEM) 0.25-35 MG-MCG tablet Take 1 tablet by mouth daily. 01/28/14   Willodean Rosenthalarolyn Harraway-Smith, MD  ondansetron (ZOFRAN ODT) 4 MG disintegrating tablet Take 1 tablet (4 mg total) by mouth every 8 (eight) hours as needed. 06/25/14   Elson AreasLeslie K Sofia, PA-C  oxyCODONE-acetaminophen (PERCOCET/ROXICET) 5-325 MG per tablet Take 2 tablets by mouth every 4 (four) hours as needed for severe pain. 02/28/14   Elson AreasLeslie K Sofia, PA-C  oxyCODONE-acetaminophen (PERCOCET/ROXICET) 5-325 MG per tablet Take 1-2 tablets by mouth every 6 (six) hours as needed for moderate pain or severe pain. 03/24/14   Teressa LowerVrinda Pickering, NP  polyethylene glycol (MIRALAX) packet Take 17 g by mouth daily. 06/25/14   Elson AreasLeslie K Sofia, PA-C  traMADol (ULTRAM) 50 MG tablet Take 50 mg by mouth every 6 (six) hours as needed.    Historical Provider, MD   BP 112/63 mmHg  Pulse 82  Temp(Src) 98.5 F (36.9 C) (Oral)  Resp 16  Ht 5\' 2"  (1.575 m)  Wt 115 lb (52.164 kg)  BMI 21.03 kg/m2  SpO2 100%  LMP 08/18/2014 Physical Exam  Constitutional: She is oriented to person, place, and time. She appears well-developed and well-nourished.   HENT:  Head: Normocephalic and atraumatic.  Eyes: Conjunctivae and EOM are normal.  Neck: Normal range of motion.  Cardiovascular: Normal rate.   Pulmonary/Chest: Effort normal.  Abdominal: She exhibits no distension.  Musculoskeletal: Normal range of motion.  Left ankle ROM and strength is limited 2/2 pain, no bony abnormality or deformity  Left forearm ttp, no bony abnormality or deformity, ROM and strength 5/5  Neurological: She is alert and oriented to person, place, and time.  Sensation and strength intact  Skin: Skin is dry.  Mild abrasion to left ankle  Psychiatric: She has a normal mood and affect. Her behavior is normal. Judgment and thought content normal.  Nursing note and vitals reviewed.   ED Course  Procedures (including critical care time) Labs Review Labs Reviewed - No data to display  Imaging Review Dg Forearm Left  08/28/2014   CLINICAL DATA:  Status post fall  EXAM: LEFT FOREARM - 2 VIEW  COMPARISON:  None.  FINDINGS: There is no evidence of fracture or other focal bone lesions. Soft tissues are unremarkable.  IMPRESSION: Negative.   Electronically Signed   By: Elige KoHetal  Patel   On: 08/28/2014 15:14   Dg Ankle Complete Left  08/28/2014   CLINICAL DATA:  Status post fall this morning, lateral ankle pain, initial encounter  EXAM: LEFT ANKLE COMPLETE - 3+ VIEW  COMPARISON:  None.  FINDINGS: There is no evidence of fracture, dislocation, or joint effusion. There is no evidence of arthropathy or other focal bone abnormality. Soft tissues are unremarkable.  IMPRESSION: Negative.   Electronically Signed   By: Elige KoHetal  Patel   On: 08/28/2014 15:10     EKG Interpretation None      MDM   Final diagnoses:  Fall  Ankle sprain, left, initial encounter    Patient with fall, probable ankle sprain, minimal swelling, no bony abnormality or deformity. Plain films are negative. Agent has received multiple prescriptions for pain medications, I do not feel that additional  narcotics would be beneficial, but will prescribe NSAIDs and ortho follow-up.  Patient will be given ankle brace and crutches.    Roxy Horsemanobert Cedric Mcclaine, PA-C 08/28/14 1525  Elwin MochaBlair Walden, MD 09/02/14 [id number]

## 2014-08-28 NOTE — Discharge Instructions (Signed)

## 2014-08-28 NOTE — ED Notes (Signed)
Pt sts she tripped and fell down some stairs. Pt is c/o left ankle and left arm pain. Pt denies loc.

## 2014-10-21 ENCOUNTER — Encounter (HOSPITAL_BASED_OUTPATIENT_CLINIC_OR_DEPARTMENT_OTHER): Payer: Self-pay | Admitting: *Deleted

## 2014-10-21 ENCOUNTER — Emergency Department (HOSPITAL_BASED_OUTPATIENT_CLINIC_OR_DEPARTMENT_OTHER)
Admission: EM | Admit: 2014-10-21 | Discharge: 2014-10-21 | Disposition: A | Discharge status: Home / Self Care | Payer: Managed Care, Other (non HMO) | Attending: Emergency Medicine | Admitting: Emergency Medicine

## 2014-10-21 DIAGNOSIS — R112 Nausea with vomiting, unspecified: Secondary | ICD-10-CM | POA: Insufficient documentation

## 2014-10-21 DIAGNOSIS — Z3202 Encounter for pregnancy test, result negative: Secondary | ICD-10-CM | POA: Insufficient documentation

## 2014-10-21 DIAGNOSIS — Z8742 Personal history of other diseases of the female genital tract: Secondary | ICD-10-CM | POA: Insufficient documentation

## 2014-10-21 DIAGNOSIS — Z8639 Personal history of other endocrine, nutritional and metabolic disease: Secondary | ICD-10-CM | POA: Insufficient documentation

## 2014-10-21 DIAGNOSIS — Z79899 Other long term (current) drug therapy: Secondary | ICD-10-CM | POA: Insufficient documentation

## 2014-10-21 DIAGNOSIS — Z791 Long term (current) use of non-steroidal anti-inflammatories (NSAID): Secondary | ICD-10-CM | POA: Insufficient documentation

## 2014-10-21 DIAGNOSIS — R509 Fever, unspecified: Secondary | ICD-10-CM | POA: Insufficient documentation

## 2014-10-21 DIAGNOSIS — R102 Pelvic and perineal pain: Secondary | ICD-10-CM | POA: Insufficient documentation

## 2014-10-21 DIAGNOSIS — N938 Other specified abnormal uterine and vaginal bleeding: Secondary | ICD-10-CM | POA: Insufficient documentation

## 2014-10-21 HISTORY — DX: Endometriosis, unspecified: N80.9

## 2014-10-21 LAB — PREGNANCY, URINE: Preg Test, Ur: NEGATIVE

## 2014-10-21 LAB — URINALYSIS, ROUTINE W REFLEX MICROSCOPIC
Bilirubin Urine: NEGATIVE
Glucose, UA: NEGATIVE mg/dL
Hgb urine dipstick: NEGATIVE
Ketones, ur: NEGATIVE mg/dL
LEUKOCYTES UA: NEGATIVE
NITRITE: NEGATIVE
Protein, ur: NEGATIVE mg/dL
Specific Gravity, Urine: 1.026 (ref 1.005–1.030)
Urobilinogen, UA: 0.2 mg/dL (ref 0.0–1.0)
pH: 6 (ref 5.0–8.0)

## 2014-10-21 MED ORDER — ONDANSETRON 8 MG PO TBDP
8.0000 mg | ORAL_TABLET | Freq: Once | ORAL | Status: AC
  Administered 2014-10-21: 8 mg via ORAL
  Filled 2014-10-21: qty 1

## 2014-10-21 MED ORDER — OXYCODONE-ACETAMINOPHEN 5-325 MG PO TABS
2.0000 | ORAL_TABLET | Freq: Once | ORAL | Status: AC
  Administered 2014-10-21: 2 via ORAL
  Filled 2014-10-21: qty 2

## 2014-10-21 NOTE — ED Notes (Signed)
MD at bedside. 

## 2014-10-21 NOTE — ED Notes (Signed)
Pt amb to room 9 with quick steady gait smiling in nad. Pt reports being seen at [facility] on 10/03/14 and dx with left ovarian cyst. Was told if she developed a fever or other sx to report to her gyn, pt reports n/v onset around 11pm, with temp of 101 last night. States she called her gyn this morning and was told to come to ed for eval.

## 2014-10-21 NOTE — Discharge Instructions (Signed)
. °  SEEK IMMEDIATE MEDICAL ATTENTION IF: °The pain does not go away or becomes severe, particularly over the next 8-12 hours.  °Repeated vomiting occurs (multiple episodes).  °The pain becomes localized to portions of the abdomen. The right side could possibly be appendicitis. In an adult, the left lower portion of the abdomen could be colitis or diverticulitis.  °Blood is being passed in stools or vomit (bright red or black tarry stools).  °Return also if you develop chest pain, difficulty breathing, dizziness or fainting, or become confused, poorly responsive, or inconsolable. ° °

## 2014-10-21 NOTE — ED Notes (Signed)
Will give po pain meds once nausea has subsided. Pt in agreement with plan.

## 2014-10-21 NOTE — ED Provider Notes (Signed)
CSN: [id number]     Arrival date & time 10/21/14  0745 History   First MD Initiated Contact with Patient 10/21/14 [phone]     Chief Complaint  Patient presents with  . Abdominal Pain    Patient is a 24 y.o. female presenting with abdominal pain. The history is provided by the patient.  Abdominal Pain Pain location:  LLQ Pain quality: aching   Pain severity:  Moderate Onset quality:  Gradual Duration:  1 day Timing:  Constant Progression:  Worsening Chronicity:  Recurrent Relieved by:  Nothing Worsened by:  Nothing tried Associated symptoms: cough, fever, nausea, vaginal bleeding and vomiting   Associated symptoms: no diarrhea and no dysuria   Patient reports gradual onset of LLQ pain yesterday She reports she has lower abdominal pain at least 2 weeks out of each month but this is "different" She also reports light vaginal bleeding She reports fever up to 101 last night.  She was told by her OBGYN to go to the ER Her OBGYN is in [facility], however she had recent evaluation this past month at [facility] and had a pelvic US that showed ovarian cysts She tells me she has no pain meds at home   Past Medical History  Diagnosis Date  . Polycystic ovary disease   . Ovarian cyst rupture   . Endometriosis    Past Surgical History  Procedure Laterality Date  . Tossilectomy    . Tonsillectomy     Family History  Problem Relation Age of Onset  . Diabetes Sister   . Diabetes Maternal Grandfather   . Hypertension Maternal Grandfather   . Hypertension Paternal Grandmother   . Heart attack Neg Hx    History  Substance Use Topics  . Smoking status: Never Smoker   . Smokeless tobacco: Never Used  . Alcohol Use: Yes     Comment: occasional   OB History    Gravida Para Term Preterm AB TAB SAB Ectopic Multiple Living   Review of Systems  Constitutional: Positive for fever.  Respiratory: Positive for cough.   Gastrointestinal: Positive for nausea,  vomiting and abdominal pain. Negative for diarrhea.  Genitourinary: Positive for vaginal bleeding. Negative for dysuria and frequency.  All other systems reviewed and are negative.     Allergies  Review of patient's allergies indicates no known allergies.  Home Medications   Prior to Admission medications   Medication Sig Start Date End Date Taking? Authorizing Provider  methylPREDNISolone acetate (DEPO-MEDROL) 40 MG/ML injection once.   Yes Historical Provider, MD  ibuprofen (ADVIL,MOTRIN) 600 MG tablet Take 1 tablet (600 mg total) by mouth every 6 (six) hours as needed. 12/31/13   Shari A Upstill, PA-C  ibuprofen (ADVIL,MOTRIN) 800 MG tablet Take 1 tablet (800 mg total) by mouth 3 (three) times daily. 08/28/14   Roxy Horseman, PA-C  norgestimate-ethinyl estradiol (ORTHO-CYCLEN,SPRINTEC,PREVIFEM) 0.25-35 MG-MCG tablet Take 1 tablet by mouth daily. 01/28/14   Willodean Rosenthal, MD   BP 123/68 mmHg  Pulse 92  Temp(Src) 98.2 F (36.8 C) (Oral)  Resp 16  Ht  (1.575 m)  Wt 115 lb (52.164 kg)  BMI 21.03 kg/m2  SpO2 100%  LMP 10/07/2014 Physical Exam CONSTITUTIONAL: Well developed/well nourished HEAD: Normocephalic/atraumatic EYES: EOMI/PERRL ENMT: Mucous membranes moist NECK: supple no meningeal signs SPINE/BACK:entire spine nontender CV: S1/S2 noted, no murmurs/rubs/gallops noted LUNGS: Lungs are clear to auscultation bilaterally, no apparent distress ABDOMEN:  soft, mild LLQ tenderness (patient draped with blanket for abdominal exam), no rebound or guarding, bowel sounds noted throughout abdomen GU:no cva tenderness, pelvic exam deferred.   NEURO: Pt is awake/alert/appropriate, moves all extremitiesx4.  No facial droop.   EXTREMITIES: pulses normal/equal, full ROM SKIN: warm, color normal PSYCH: no abnormalities of mood noted, alert and oriented to situation  ED Course  Procedures  8:46 AM Review of chart - reveals 2 CT a/p in [facility] system.  She has had 3 pelvic US  in [facility] system  Per records, she [facility] that revealed pelvic mass likely endometriosis Narcotic database reveals since November 2015 she has received 200 tablets of tramadol and 54 tablets of hydrocodone from multiple providers Will call her OBGYN today 9:11 AM Spoke to dr sansing, on call for dr patel at [facility] We discussed at length patient's case Discussed need for her to have close outpatient followup.  She was unable to schedule appt, however she will have Dr Allena KatzPatel call patient later today.   I spoke at length with patient need to have close f/u with Dr Allena KatzPatel.  Also advised to have her pain control managed by him if possible (pt with multiple ED visits in this system and also [facility]) At this point, I don't feel further imaging/labs/testing required.  She is in no distress (using phone when I enter room) she is not actively vomiting and no fever at this time.  I doubt acute abdominal emergency and I doubt TOA/Torsion at this time. BP 122/69 mmHg  Pulse 88  Temp(Src) 97.9 F (36.6 C) (Oral)  Resp 18  Ht 5\' 2"  (1.575 m)  Wt 115 lb (52.164 kg)  BMI 21.03 kg/m2  SpO2 100%  LMP 10/07/2014   Labs Review Labs Reviewed  URINALYSIS, ROUTINE W REFLEX MICROSCOPIC - Abnormal; Notable for the following:    APPearance CLOUDY (*)    All other components within normal limits  PREGNANCY, URINE    Medications  oxyCODONE-acetaminophen (PERCOCET/ROXICET) 5-325 MG per tablet 2 tablet (2 tablets Oral Given 10/21/14 0845)  ondansetron (ZOFRAN-ODT) disintegrating tablet 8 mg (8 mg Oral Given 10/21/14 0835)     MDM   Final diagnoses:  Acute pelvic pain, female    Nursing notes including past medical history and social history reviewed and considered in documentation Labs/vital reviewed myself and considered during evaluation Narcotic database reviewed and considered in decision making Previous records reviewed and  considered     Joya Gaskinsonald W Damaree Sargent, MD 10/21/14 [phone]

## 2014-12-26 ENCOUNTER — Inpatient Hospital Stay (HOSPITAL_COMMUNITY): Payer: BLUE CROSS/BLUE SHIELD

## 2014-12-26 ENCOUNTER — Inpatient Hospital Stay (HOSPITAL_COMMUNITY)
Admission: AD | Admit: 2014-12-26 | Discharge: 2014-12-26 | Disposition: A | Discharge status: Home / Self Care | Payer: BLUE CROSS/BLUE SHIELD | Source: Ambulatory Visit | Attending: Obstetrics and Gynecology | Admitting: Obstetrics and Gynecology

## 2014-12-26 DIAGNOSIS — Z8742 Personal history of other diseases of the female genital tract: Secondary | ICD-10-CM | POA: Diagnosis not present

## 2014-12-26 DIAGNOSIS — N83202 Unspecified ovarian cyst, left side: Secondary | ICD-10-CM

## 2014-12-26 DIAGNOSIS — N8320 Unspecified ovarian cysts: Secondary | ICD-10-CM

## 2014-12-26 DIAGNOSIS — R109 Unspecified abdominal pain: Secondary | ICD-10-CM | POA: Diagnosis present

## 2014-12-26 DIAGNOSIS — R102 Pelvic and perineal pain: Secondary | ICD-10-CM

## 2014-12-26 LAB — URINALYSIS, ROUTINE W REFLEX MICROSCOPIC
Bilirubin Urine: NEGATIVE
Glucose, UA: NEGATIVE mg/dL
Hgb urine dipstick: NEGATIVE
Ketones, ur: 15 mg/dL — AB
Leukocytes, UA: NEGATIVE
Nitrite: NEGATIVE
PH: 6 (ref 5.0–8.0)
Protein, ur: NEGATIVE mg/dL
SPECIFIC GRAVITY, URINE: 1.025 (ref 1.005–1.030)
Urobilinogen, UA: 0.2 mg/dL (ref 0.0–1.0)

## 2014-12-26 LAB — POCT PREGNANCY, URINE: PREG TEST UR: NEGATIVE

## 2014-12-26 MED ORDER — PROMETHAZINE HCL 25 MG/ML IJ SOLN
25.0000 mg | Freq: Once | INTRAMUSCULAR | Status: AC
  Administered 2014-12-26: 25 mg via INTRAVENOUS
  Filled 2014-12-26: qty 1

## 2014-12-26 MED ORDER — LACTATED RINGERS IV BOLUS (SEPSIS)
1000.0000 mL | Freq: Once | INTRAVENOUS | Status: AC
  Administered 2014-12-26: 1000 mL via INTRAVENOUS

## 2014-12-26 MED ORDER — HYDROMORPHONE HCL 1 MG/ML IJ SOLN
1.0000 mg | Freq: Once | INTRAMUSCULAR | Status: AC
Start: 2014-12-26 — End: 2014-12-26
  Administered 2014-12-26: 1 mg via INTRAMUSCULAR
  Filled 2014-12-26: qty 1

## 2014-12-26 MED ORDER — KETOROLAC TROMETHAMINE 30 MG/ML IJ SOLN
30.0000 mg | Freq: Once | INTRAMUSCULAR | Status: AC
  Administered 2014-12-26: 30 mg via INTRAVENOUS
  Filled 2014-12-26: qty 1

## 2014-12-26 MED ORDER — KETOROLAC TROMETHAMINE 60 MG/2ML IM SOLN: 60.0000 mg | Freq: Once | INTRAMUSCULAR | Status: DC

## 2014-12-26 MED ORDER — PROMETHAZINE HCL 25 MG PO TABS: 12.5000 mg | ORAL_TABLET | Freq: Four times a day (QID) | ORAL | Status: DC | PRN

## 2014-12-26 MED ORDER — OXYCODONE-ACETAMINOPHEN 5-325 MG PO TABS: 1.0000 | ORAL_TABLET | Freq: Four times a day (QID) | ORAL | Status: DC | PRN

## 2014-12-26 NOTE — Discharge Instructions (Signed)
Ovarian Cyst An ovarian cyst is a fluid-filled sac that forms on an ovary. The ovaries are small organs that produce eggs in women. Various types of cysts can form on the ovaries. Most are not cancerous. Many do not cause problems, and they often go away on their own. Some may cause symptoms and require treatment. Common types of ovarian cysts include:  Functional cysts--These cysts may occur every month during the menstrual cycle. This is normal. The cysts usually go away with the next menstrual cycle if the woman does not get pregnant. Usually, there are no symptoms with a functional cyst.  Endometrioma cysts--These cysts form from the tissue that lines the uterus. They are also called "chocolate cysts" because they become filled with blood that turns brown. This type of cyst can cause pain in the lower abdomen during intercourse and with your menstrual period.  Cystadenoma cysts--This type develops from the cells on the outside of the ovary. These cysts can get very big and cause lower abdomen pain and pain with intercourse. This type of cyst can twist on itself, cut off its blood supply, and cause severe pain. It can also easily rupture and cause a lot of pain.  Dermoid cysts--This type of cyst is sometimes found in both ovaries. These cysts may contain different kinds of body tissue, such as skin, teeth, hair, or cartilage. They usually do not cause symptoms unless they get very big.  Theca lutein cysts--These cysts occur when too much of a certain hormone (human chorionic gonadotropin) is produced and overstimulates the ovaries to produce an egg. This is most common after procedures used to assist with the conception of a baby (in vitro fertilization). CAUSES   Fertility drugs can cause a condition in which multiple large cysts are formed on the ovaries. This is called ovarian hyperstimulation syndrome.  A condition called polycystic ovary syndrome can cause hormonal imbalances that can lead to  nonfunctional ovarian cysts. SIGNS AND SYMPTOMS  Many ovarian cysts do not cause symptoms. If symptoms are present, they may include:  Pelvic pain or pressure.  Pain in the lower abdomen.  Pain during sexual intercourse.  Increasing girth (swelling) of the abdomen.  Abnormal menstrual periods.  Increasing pain with menstrual periods.  Stopping having menstrual periods without being pregnant. DIAGNOSIS  These cysts are commonly found during a routine or annual pelvic exam. Tests may be ordered to find out more about the cyst. These tests may include:  Ultrasound.  X-ray of the pelvis.  CT scan.  MRI.  Blood tests. TREATMENT  Many ovarian cysts go away on their own without treatment. Your health care provider may want to check your cyst regularly for 2-3 months to see if it changes. For women in menopause, it is particularly important to monitor a cyst closely because of the higher rate of ovarian cancer in menopausal women. When treatment is needed, it may include any of the following:  A procedure to drain the cyst (aspiration). This may be done using a long needle and ultrasound. It can also be done through a laparoscopic procedure. This involves using a thin, lighted tube with a tiny camera on the end (laparoscope) inserted through a small incision.  Surgery to remove the whole cyst. This may be done using laparoscopic surgery or an open surgery involving a larger incision in the lower abdomen.  Hormone treatment or birth control pills. These methods are sometimes used to help dissolve a cyst. HOME CARE INSTRUCTIONS   Only take over-the-counter   or prescription medicines as directed by your health care provider.  Follow up with your health care provider as directed.  Get regular pelvic exams and Pap tests. SEEK MEDICAL CARE IF:   Your periods are late, irregular, or painful, or they stop.  Your pelvic pain or abdominal pain does not go away.  Your abdomen becomes  larger or swollen.  You have pressure on your bladder or trouble emptying your bladder completely.  You have pain during sexual intercourse.  You have feelings of fullness, pressure, or discomfort in your stomach.  You lose weight for no apparent reason.  You feel generally ill.  You become constipated.  You lose your appetite.  You develop acne.  You have an increase in body and facial hair.  You are gaining weight, without changing your exercise and eating habits.  You think you are pregnant. SEEK IMMEDIATE MEDICAL CARE IF:   You have increasing abdominal pain.  You feel sick to your stomach (nauseous), and you throw up (vomit).  You develop a fever that comes on suddenly.  You have abdominal pain during a bowel movement.  Your menstrual periods become heavier than usual. MAKE SURE YOU:  Understand these instructions.  Will watch your condition.  Will get help right away if you are not doing well or get worse. Document Released: 09/12/2005 Document Revised: 09/17/2013 Document Reviewed: 05/20/2013 [employer] Patient Information 2015 [employer]. This information is not intended to replace advice given to you by your health care provider. Make sure you discuss any questions you have with your health care provider.  

## 2014-12-26 NOTE — Progress Notes (Signed)
Written and verbal d/c instructions given and understanding voiced. 

## 2014-12-26 NOTE — MAU Note (Signed)
Pt presents to MAU with complaints of pain in her left side for 3 days but the pain got worse this am. Denies any vaginal bleeding or discharge

## 2014-12-26 NOTE — MAU Provider Note (Signed)
Chief Complaint: left sided pain    None     SUBJECTIVE HPI: Amanda Vazquez is a 24 y.o. G1P0010 who presents to maternity admissions sent from the office to evaluate for ovarian torsion.  She has known left ovarian cyst with surgery scheduled later this month.  She reports increased pain in her abdomen, both sides, but mostly the left, described as sharp and burning pain radiating down her left leg. The pain is associated with nausea. She has tramadol at home for pain and today it is not helping.  She was seen in the office and sent to MAU related to her increased pain. She denies vaginal bleeding, vaginal itching/burning, urinary symptoms, h/a, dizziness, or fever/chills.    Past Medical History  Diagnosis Date  . Polycystic ovary disease   . Ovarian cyst rupture   . Endometriosis    Past Surgical History  Procedure Laterality Date  . Tossilectomy    . Tonsillectomy     History   Social History  . Marital Status: Single    Spouse Name: N/A  . Number of Children: N/A  . Years of Education: N/A   Occupational History  . Not on file.   Social History Main Topics  . Smoking status: Never Smoker   . Smokeless tobacco: Never Used  . Alcohol Use: Yes     Comment: occasional  . Drug Use: No  . Sexual Activity: Yes    Birth Control/ Protection: Pill   Other Topics Concern  . Not on file   Social History Narrative   No current facility-administered medications on file prior to encounter.   Current Outpatient Prescriptions on File Prior to Encounter  Medication Sig Dispense Refill  . ibuprofen (ADVIL,MOTRIN) 600 MG tablet Take 1 tablet (600 mg total) by mouth every 6 (six) hours as needed. 30 tablet 0  . ibuprofen (ADVIL,MOTRIN) 800 MG tablet Take 1 tablet (800 mg total) by mouth 3 (three) times daily. 21 tablet 0  . methylPREDNISolone acetate (DEPO-MEDROL) 40 MG/ML injection once.    . norgestimate-ethinyl estradiol (ORTHO-CYCLEN,SPRINTEC,PREVIFEM) 0.25-35 MG-MCG tablet  Take 1 tablet by mouth daily. 1 Package 11   No Known Allergies  ROS: Pertinent items in HPI  OBJECTIVE Blood pressure 118/63, pulse 69, temperature 97.3 F (36.3 C), resp. rate 18, height  (1.626 m), weight 50.803 kg (112 lb), last menstrual period 12/09/2014. GENERAL: Well-developed, well-nourished female in mild distress.  HEENT: Normocephalic HEART: normal rate RESP: normal effort ABDOMEN: Soft, tenderness in LLQ, no tenderness in RLQ, no rebound tenderness, no guarding EXTREMITIES: Nontender, no edema NEURO: Alert and oriented Pelvic exam: Cervix pink, visually closed, without lesion, scant white creamy discharge, vaginal walls and external genitalia normal Bimanual exam: Cervix 0/long/high, firm, anterior, neg CMT, uterus nontender, nonenlarged, adnexa without tenderness, enlargement, or mass  LAB RESULTS Results for orders placed or performed during the hospital encounter of 12/26/14 (from the past 24 hour(s))  Urinalysis, Routine w reflex microscopic     Status: Abnormal   Collection Time: 12/26/14  5:05 PM  Result Value Ref Range   Color, Urine YELLOW YELLOW   APPearance CLEAR CLEAR   Specific Gravity, Urine 1.025 1.005 - 1.030   pH 6.0 5.0 - 8.0   Glucose, UA NEGATIVE NEGATIVE mg/dL   Hgb urine dipstick NEGATIVE NEGATIVE   Bilirubin Urine NEGATIVE NEGATIVE   Ketones, ur 15 (A) NEGATIVE mg/dL   Protein, ur NEGATIVE NEGATIVE mg/dL   Urobilinogen, UA 0.2 0.0 - 1.0 mg/dL   Nitrite NEGATIVE  NEGATIVE   Leukocytes, UA NEGATIVE NEGATIVE  Pregnancy, urine POC     Status: None   Collection Time: 12/26/14  5:22 PM  Result Value Ref Range   Preg Test, Ur NEGATIVE NEGATIVE    IMAGING US Transvaginal Non-ob  12/26/2014   CLINICAL DATA:  Acute pelvic pain  EXAM: TRANSABDOMINAL AND TRANSVAGINAL ULTRASOUND OF PELVIS  DOPPLER ULTRASOUND OF OVARIES  TECHNIQUE: Both transabdominal and transvaginal ultrasound examinations of the pelvis were performed. Transabdominal technique was  performed for global imaging of the pelvis including uterus, ovaries, adnexal regions, and pelvic cul-de-sac.  It was necessary to proceed with endovaginal exam following the transabdominal exam to visualize the endometrium and both ovaries. Color and duplex Doppler ultrasound was utilized to evaluate blood flow to the ovaries.  COMPARISON:  10/31/2014  FINDINGS: Uterus  Measurements: 6.8 x 3.4 x 4.8 cm. No fibroids or other mass visualized.  Endometrium  Thickness: 5.2 mm.  No focal abnormality visualized.  Right ovary  Measurements: 3.5 x 2.6 x 3.0 cm. Normal appearance/no adnexal mass.  Left ovary  Measurements: 4.1 x 2.5 x 2.7 cm. Normal appearance/no adnexal mass.  Pulsed Doppler evaluation of both ovaries demonstrates normal low-resistance arterial and venous waveforms.  Other findings  There is a moderate volume free pelvic fluid.  IMPRESSION: Normal uterus and ovaries. Intact perfusion of both ovaries on Doppler evaluation. Moderate volume free pelvic fluid.   Electronically Signed   By: Ellery Plunk M.D.   On: 12/26/2014 19:04   US Pelvis Complete  12/26/2014   CLINICAL DATA:  Acute pelvic pain  EXAM: TRANSABDOMINAL AND TRANSVAGINAL ULTRASOUND OF PELVIS  DOPPLER ULTRASOUND OF OVARIES  TECHNIQUE: Both transabdominal and transvaginal ultrasound examinations of the pelvis were performed. Transabdominal technique was performed for global imaging of the pelvis including uterus, ovaries, adnexal regions, and pelvic cul-de-sac.  It was necessary to proceed with endovaginal exam following the transabdominal exam to visualize the endometrium and both ovaries. Color and duplex Doppler ultrasound was utilized to evaluate blood flow to the ovaries.  COMPARISON:  10/31/2014  FINDINGS: Uterus  Measurements: 6.8 x 3.4 x 4.8 cm. No fibroids or other mass visualized.  Endometrium  Thickness: 5.2 mm.  No focal abnormality visualized.  Right ovary  Measurements: 3.5 x 2.6 x 3.0 cm. Normal appearance/no adnexal mass.   Left ovary  Measurements: 4.1 x 2.5 x 2.7 cm. Normal appearance/no adnexal mass.  Pulsed Doppler evaluation of both ovaries demonstrates normal low-resistance arterial and venous waveforms.  Other findings  There is a moderate volume free pelvic fluid.  IMPRESSION: Normal uterus and ovaries. Intact perfusion of both ovaries on Doppler evaluation. Moderate volume free pelvic fluid.   Electronically Signed   By: Ellery Plunk M.D.   On: 12/26/2014 19:04   [place] Art/ven Flow Abd Pelv Doppler  12/26/2014   CLINICAL DATA:  Acute pelvic pain  EXAM: TRANSABDOMINAL AND TRANSVAGINAL ULTRASOUND OF PELVIS  DOPPLER ULTRASOUND OF OVARIES  TECHNIQUE: Both transabdominal and transvaginal ultrasound examinations of the pelvis were performed. Transabdominal technique was performed for global imaging of the pelvis including uterus, ovaries, adnexal regions, and pelvic cul-de-sac.  It was necessary to proceed with endovaginal exam following the transabdominal exam to visualize the endometrium and both ovaries. Color and duplex Doppler ultrasound was utilized to evaluate blood flow to the ovaries.  COMPARISON:  10/31/2014  FINDINGS: Uterus  Measurements: 6.8 x 3.4 x 4.8 cm. No fibroids or other mass visualized.  Endometrium  Thickness: 5.2 mm.  No focal abnormality  visualized.  Right ovary  Measurements: 3.5 x 2.6 x 3.0 cm. Normal appearance/no adnexal mass.  Left ovary  Measurements: 4.1 x 2.5 x 2.7 cm. Normal appearance/no adnexal mass.  Pulsed Doppler evaluation of both ovaries demonstrates normal low-resistance arterial and venous waveforms.  Other findings  There is a moderate volume free pelvic fluid.  IMPRESSION: Normal uterus and ovaries. Intact perfusion of both ovaries on Doppler evaluation. Moderate volume free pelvic fluid.   Electronically Signed   By: Ellery Plunkaniel R Mitchell M.D.   On: 12/26/2014 19:04   MAU Management LR x 1000 ml, Dilaudid 1 mg IV, Phenergan 25 mg IV.  Pt reports good pain relief x 1-1.5 hours then pain  returned.  Toradol 30 mg IV x 1 dose.  ASSESSMENT 1. Left ovarian cyst   2. Acute pelvic pain, female     PLAN Consult Dr Marcelle OverlieHolland Discharge home Percocet 5/325, take 1-2 Q 4-6 hours PRN x 20 tabs Phenergan 12.5-25 mg PO Q 6 hours PRN Call office on Monday for follow up    Medication List    TAKE these medications        ibuprofen 600 MG tablet  Commonly known as:  ADVIL,MOTRIN  Take 1 tablet (600 mg total) by mouth every 6 (six) hours as needed.     methylPREDNISolone acetate 40 MG/ML injection  Commonly known as:  DEPO-MEDROL  once.     norgestimate-ethinyl estradiol 0.25-35 MG-MCG tablet  Commonly known as:  ORTHO-CYCLEN,SPRINTEC,PREVIFEM  Take 1 tablet by mouth daily.     oxyCODONE-acetaminophen 5-325 MG per tablet  Commonly known as:  PERCOCET/ROXICET  Take 1-2 tablets by mouth every 6 (six) hours as needed.     promethazine 25 MG tablet  Commonly known as:  PHENERGAN  Take 0.5-1 tablets (12.5-25 mg total) by mouth every 6 (six) hours as needed.       Follow-up Information    Follow up with Meriel PicaHOLLAND,RICHARD M, MD.   Specialty:  Obstetrics and Gynecology   Why:  Call on Monday for appointment   Contact information:   [address] [place] [id number] [phone]       Follow up with [facility].   Why:  As needed for emergencies   Contact information:   [address] [id number] [place] [phone]      Sharen CounterLisa Leftwich-Kirby Certified Nurse-Midwife 12/26/2014  8:12 PM

## 2015-01-01 ENCOUNTER — Encounter (HOSPITAL_BASED_OUTPATIENT_CLINIC_OR_DEPARTMENT_OTHER): Payer: Self-pay | Admitting: *Deleted

## 2015-01-01 NOTE — Progress Notes (Signed)
NPO AFTER MN.  ARRIVE AT 0600. NEEDS HG. CURRENT URINE PREG RESULTS IN CHART AND  EPIC.  WILL TAKE BIRTH CONTROL PILL W/ SIP OF WATER DOS AND IF NEEDED TAKE TRAMADOL.  PRE-OP ORDERS PENDING.

## 2015-01-05 NOTE — H&P (Signed)
  Patient name  Amanda Vazquez, Amanda Vazquez DICTATION#  [id number] CSN# [id number]  Juluis MireMCCOMB,Hamdi Vari S, MD 01/05/2015 2:34 PM

## 2015-01-06 NOTE — H&P (Signed)
NAME:  Amanda Vazquez, Caera            ACCOUNT NO.:  [id number]  MEDICAL RECORD NO.:  [id number]  LOCATION:                                 FACILITY:  PHYSICIAN:  Juluis MireJohn S. Chelisa Hennen, M.D.   DATE OF BIRTH:  1991-03-11  DATE OF ADMISSION:  01/08/2015 DATE OF DISCHARGE:                             HISTORY & PHYSICAL   HISTORY OF PRESENT ILLNESS:  The patient is a 24 year old gravida 1, para 0 female, comes in for diagnostic laparoscopy.  She has a history of chronic pelvic pain.  Previous laparoscopy done last year describes significant endometriosis.  We had done an ultrasound, revealed a 2.5 cm cyst in the left ovary, could have been an endometrioma.  She has continued to have pain and discomfort, has been unresponsive to Depo- Provera.  She had seen Dr. Vernie Ammonsttelin to rule out interstitial cystitis. He felt like that was unlikely.  She now comes for repeat laparoscopic evaluation.  ALLERGIES:  She has no known drug allergies.  MEDICATIONS:  Ibuprofen, tramadol, and Depo shots.  PAST MEDICAL HISTORY:  Usual childhood diseases.  No significant sequelae, does have a history of migraine headaches.  PREVIOUS SURGERIES:  Laparoscopy last year.  Had tonsils removed in 2004.  SOCIAL HISTORY:  No tobacco or alcohol use.  FAMILY HISTORY:  Noncontributory.  REVIEW OF SYSTEMS:  Noncontributory.  PHYSICAL EXAMINATION:  VITAL SIGNS:  The patient is afebrile.  Stable vital signs. HEENT:  The patient is normocephalic.  Pupils equal, round, and reactive to light and accommodation.  Extraocular movements were intact.  Sclerae __________ clear.  Oropharynx is clear. BREASTS:  Not examined. LUNGS:  Clear. CARDIOVASCULAR:  Regular rhythm and rate.  There are no murmurs or gallops. ABDOMEN:  Benign.  No mass, organomegaly, or tenderness. PELVIC:  Normal external genitalia.  Vaginal mucosa is clear. __________ normal size, slightly tender.  Adnexa unremarkable. EXTREMITIES:  Trace edema. NEUROLOGIC:   Grossly normal limits.  IMPRESSION: Chronic pelvic pain, possible secondary to persistent pelvic endometriosis.  PLAN:  Options have been discussed including Depo Lupron, continued hormonal suppression versus laparoscopy, for which she comes in at the present time.  The risks of surgery have been discussed including the risk of infection; the risk of hemorrhage that could require transfusion with the risk of HIV or hepatitis; risk of injury to adjacent organs including bladder, bowel, or ureters that could require further exploratory surgery; risk of deep venus thrombosis and pulmonary embolus.  The patient voiced understanding of indications, risks, and alternatives.     Juluis MireJohn S. Kathlee Barnhardt, M.D.     JSM/MEDQ  D:  01/05/2015  T:  01/05/2015  Job:  [id number]

## 2015-01-07 ENCOUNTER — Encounter (HOSPITAL_BASED_OUTPATIENT_CLINIC_OR_DEPARTMENT_OTHER): Payer: Self-pay | Admitting: Anesthesiology

## 2015-01-07 NOTE — Progress Notes (Signed)
Left message for Heather at Dr. Gaye AlkenMccombs office regarding orders for Cbc, hcg.  Ok for hgb, but pt needs serum hcg.

## 2015-01-07 NOTE — Anesthesia Preprocedure Evaluation (Signed)
Anesthesia Evaluation  Patient identified by MRN, date of birth, ID band Patient awake    Reviewed: Allergy & Precautions, NPO status , Patient's Chart, lab work & pertinent test results  Airway Mallampati: II  TM Distance: >3 FB Neck ROM: Full    Dental no notable dental hx.    Pulmonary neg pulmonary ROS,  breath sounds clear to auscultation  Pulmonary exam normal       Cardiovascular Exercise Tolerance: Good negative cardio ROS  Rhythm:Regular Rate:Normal     Neuro/Psych negative neurological ROS  negative psych ROS   GI/Hepatic negative GI ROS, Neg liver ROS,   Endo/Other  negative endocrine ROS  Renal/GU negative Renal ROS  negative genitourinary   Musculoskeletal negative musculoskeletal ROS (+)   Abdominal   Peds negative pediatric ROS (+)  Hematology negative hematology ROS (+)   Anesthesia Other Findings   Reproductive/Obstetrics negative OB ROS                             Anesthesia Physical Anesthesia Plan  ASA: I  Anesthesia Plan: General   Post-op Pain Management:    Induction: Intravenous  Airway Management Planned: Oral ETT  Additional Equipment:   Intra-op Plan:   Post-operative Plan: Extubation in OR  Informed Consent: I have reviewed the patients History and Physical, chart, labs and discussed the procedure including the risks, benefits and alternatives for the proposed anesthesia with the patient or authorized representative who has indicated his/her understanding and acceptance.   Dental advisory given  Plan Discussed with: CRNA  Anesthesia Plan Comments:         Anesthesia Quick Evaluation

## 2015-01-08 ENCOUNTER — Ambulatory Visit (HOSPITAL_BASED_OUTPATIENT_CLINIC_OR_DEPARTMENT_OTHER)
Admission: RE | Admit: 2015-01-08 | Discharge: 2015-01-08 | Disposition: A | Discharge status: Home / Self Care | Payer: BLUE CROSS/BLUE SHIELD | Source: Ambulatory Visit | Attending: Obstetrics and Gynecology | Admitting: Obstetrics and Gynecology

## 2015-01-08 ENCOUNTER — Encounter (HOSPITAL_BASED_OUTPATIENT_CLINIC_OR_DEPARTMENT_OTHER)
Admission: RE | Disposition: A | Discharge status: Home / Self Care | Payer: Self-pay | Source: Ambulatory Visit | Attending: Obstetrics and Gynecology

## 2015-01-08 ENCOUNTER — Ambulatory Visit (HOSPITAL_BASED_OUTPATIENT_CLINIC_OR_DEPARTMENT_OTHER): Payer: BLUE CROSS/BLUE SHIELD | Admitting: Anesthesiology

## 2015-01-08 ENCOUNTER — Encounter (HOSPITAL_BASED_OUTPATIENT_CLINIC_OR_DEPARTMENT_OTHER): Payer: Self-pay | Admitting: *Deleted

## 2015-01-08 DIAGNOSIS — R102 Pelvic and perineal pain: Secondary | ICD-10-CM | POA: Diagnosis present

## 2015-01-08 DIAGNOSIS — N8320 Unspecified ovarian cysts: Secondary | ICD-10-CM | POA: Insufficient documentation

## 2015-01-08 DIAGNOSIS — Z9889 Other specified postprocedural states: Secondary | ICD-10-CM

## 2015-01-08 DIAGNOSIS — N83209 Unspecified ovarian cyst, unspecified side: Secondary | ICD-10-CM | POA: Diagnosis present

## 2015-01-08 HISTORY — PX: LAPAROSCOPIC OVARIAN CYSTECTOMY: SHX6248

## 2015-01-08 HISTORY — PX: LAPAROSCOPY: SHX197

## 2015-01-08 HISTORY — DX: Pelvic and perineal pain: R10.2

## 2015-01-08 HISTORY — DX: Frequency of micturition: R35.0

## 2015-01-08 LAB — HCG, SERUM, QUALITATIVE: Preg, Serum: NEGATIVE

## 2015-01-08 LAB — POCT HEMOGLOBIN-HEMACUE: HEMOGLOBIN: 13.7 g/dL (ref 12.0–15.0)

## 2015-01-08 SURGERY — LAPAROSCOPY, DIAGNOSTIC
Anesthesia: General | Site: Abdomen

## 2015-01-08 MED ORDER — DEXAMETHASONE SODIUM PHOSPHATE 4 MG/ML IJ SOLN
INTRAMUSCULAR | Status: DC | PRN
  Administered 2015-01-08: 10 mg via INTRAVENOUS

## 2015-01-08 MED ORDER — NEOSTIGMINE METHYLSULFATE 10 MG/10ML IV SOLN
INTRAVENOUS | Status: DC | PRN
  Administered 2015-01-08: 3 mg via INTRAVENOUS

## 2015-01-08 MED ORDER — KETOROLAC TROMETHAMINE 30 MG/ML IJ SOLN
INTRAMUSCULAR | Status: DC | PRN
  Administered 2015-01-08: 30 mg via INTRAVENOUS

## 2015-01-08 MED ORDER — GLYCOPYRROLATE 0.2 MG/ML IJ SOLN
INTRAMUSCULAR | Status: DC | PRN
  Administered 2015-01-08: 0.4 mg via INTRAVENOUS

## 2015-01-08 MED ORDER — PROPOFOL 10 MG/ML IV BOLUS
INTRAVENOUS | Status: DC | PRN
  Administered 2015-01-08: 180 mg via INTRAVENOUS

## 2015-01-08 MED ORDER — OXYCODONE HCL 5 MG PO TABS
ORAL_TABLET | ORAL | Status: AC
  Filled 2015-01-08: qty 1

## 2015-01-08 MED ORDER — ONDANSETRON HCL 4 MG/2ML IJ SOLN
INTRAMUSCULAR | Status: DC | PRN
  Administered 2015-01-08: 4 mg via INTRAVENOUS

## 2015-01-08 MED ORDER — SODIUM CHLORIDE 0.9 % IJ SOLN
INTRAMUSCULAR | Status: AC
  Filled 2015-01-08: qty 10

## 2015-01-08 MED ORDER — PROMETHAZINE HCL 25 MG/ML IJ SOLN
6.2500 mg | INTRAMUSCULAR | Status: AC | PRN
  Administered 2015-01-08 (×2): 12.5 mg via INTRAVENOUS
  Filled 2015-01-08: qty 1

## 2015-01-08 MED ORDER — FENTANYL CITRATE 0.05 MG/ML IJ SOLN
25.0000 ug | INTRAMUSCULAR | Status: DC
  Filled 2015-01-08: qty 1

## 2015-01-08 MED ORDER — FENTANYL CITRATE 0.05 MG/ML IJ SOLN
INTRAMUSCULAR | Status: AC
  Filled 2015-01-08: qty 2

## 2015-01-08 MED ORDER — FENTANYL CITRATE 0.05 MG/ML IJ SOLN
INTRAMUSCULAR | Status: DC | PRN
  Administered 2015-01-08: 50 ug via INTRAVENOUS
  Administered 2015-01-08 (×4): 25 ug via INTRAVENOUS
  Administered 2015-01-08: 50 ug via INTRAVENOUS

## 2015-01-08 MED ORDER — LACTATED RINGERS IV SOLN
INTRAVENOUS | Status: DC
  Administered 2015-01-08 (×2): via INTRAVENOUS
  Filled 2015-01-08: qty 1000

## 2015-01-08 MED ORDER — SUCCINYLCHOLINE CHLORIDE 20 MG/ML IJ SOLN
INTRAMUSCULAR | Status: DC | PRN
  Administered 2015-01-08: 100 mg via INTRAVENOUS

## 2015-01-08 MED ORDER — MIDAZOLAM HCL 2 MG/2ML IJ SOLN
INTRAMUSCULAR | Status: AC
  Filled 2015-01-08: qty 2

## 2015-01-08 MED ORDER — OXYCODONE HCL 5 MG PO TABS
5.0000 mg | ORAL_TABLET | Freq: Once | ORAL | Status: AC
  Administered 2015-01-08: 5 mg via ORAL
  Filled 2015-01-08: qty 1

## 2015-01-08 MED ORDER — BUPIVACAINE HCL 0.25 % IJ SOLN
INTRAMUSCULAR | Status: DC | PRN
Start: 2015-01-08 — End: 2015-01-08
  Administered 2015-01-08: 4 mL

## 2015-01-08 MED ORDER — MIDAZOLAM HCL 5 MG/5ML IJ SOLN
INTRAMUSCULAR | Status: DC | PRN
  Administered 2015-01-08: 2 mg via INTRAVENOUS

## 2015-01-08 MED ORDER — LACTATED RINGERS IR SOLN
Status: DC | PRN
  Administered 2015-01-08: 3000 mL

## 2015-01-08 MED ORDER — FENTANYL CITRATE 0.05 MG/ML IJ SOLN
INTRAMUSCULAR | Status: AC
  Filled 2015-01-08: qty 6

## 2015-01-08 MED ORDER — ROCURONIUM BROMIDE 100 MG/10ML IV SOLN
INTRAVENOUS | Status: DC | PRN
  Administered 2015-01-08: 20 mg via INTRAVENOUS

## 2015-01-08 MED ORDER — LIDOCAINE HCL (CARDIAC) 20 MG/ML IV SOLN
INTRAVENOUS | Status: DC | PRN
  Administered 2015-01-08: 50 mg via INTRAVENOUS

## 2015-01-08 MED ORDER — OXYCODONE-ACETAMINOPHEN 7.5-325 MG PO TABS: 1.0000 | ORAL_TABLET | ORAL | Status: DC | PRN

## 2015-01-08 MED ORDER — PROMETHAZINE HCL 25 MG/ML IJ SOLN
INTRAMUSCULAR | Status: AC
  Filled 2015-01-08: qty 1

## 2015-01-08 MED ORDER — ACETAMINOPHEN 10 MG/ML IV SOLN
1000.0000 mg | Freq: Once | INTRAVENOUS | Status: AC
  Administered 2015-01-08: 1000 mg via INTRAVENOUS
  Filled 2015-01-08: qty 100

## 2015-01-08 MED ORDER — FENTANYL CITRATE 0.05 MG/ML IJ SOLN
25.0000 ug | INTRAMUSCULAR | Status: AC | PRN
  Administered 2015-01-08: 25 ug via INTRAVENOUS
  Administered 2015-01-08: 50 ug via INTRAVENOUS
  Administered 2015-01-08 (×3): 25 ug via INTRAVENOUS
  Administered 2015-01-08: 50 ug via INTRAVENOUS
  Filled 2015-01-08: qty 1

## 2015-01-08 SURGICAL SUPPLY — 50 items
APPLICATOR COTTON TIP 6IN STRL (MISCELLANEOUS) IMPLANT
BAG URINE DRAINAGE (UROLOGICAL SUPPLIES) IMPLANT
BANDAGE ADH SHEER 1  50/CT (GAUZE/BANDAGES/DRESSINGS) ×3 IMPLANT
BANDAGE ADHESIVE 1X3 (GAUZE/BANDAGES/DRESSINGS) IMPLANT
BLADE SURG 11 STRL SS (BLADE) ×3 IMPLANT
CANISTER SUCTION 1200CC (MISCELLANEOUS) IMPLANT
CANISTER SUCTION 2500CC (MISCELLANEOUS) ×3 IMPLANT
CATH FOLEY 2WAY SLVR  5CC 14FR (CATHETERS)
CATH FOLEY 2WAY SLVR 5CC 14FR (CATHETERS) IMPLANT
CATH ROBINSON RED A/P 16FR (CATHETERS) IMPLANT
COVER MAYO STAND STRL (DRAPES) ×3 IMPLANT
ELECT REM PT RETURN 9FT ADLT (ELECTROSURGICAL) ×3
ELECTRODE REM PT RTRN 9FT ADLT (ELECTROSURGICAL) ×2 IMPLANT
FILTER SMOKE EVAC LAPAROSHD (FILTER) IMPLANT
GLOVE BIO SURGEON STRL SZ7 (GLOVE) ×6 IMPLANT
GLOVE BIO SURGEON STRL SZ7.5 (GLOVE) ×3 IMPLANT
GLOVE SURG SS PI 7.5 STRL IVOR (GLOVE) ×3 IMPLANT
GOWN STRL REUS W/TWL XL LVL3 (GOWN DISPOSABLE) ×6 IMPLANT
IV LACTATED RINGER IRRG 3000ML (IV SOLUTION) ×1
IV LR IRRIG 3000ML ARTHROMATIC (IV SOLUTION) ×2 IMPLANT
LAPAROSCOPY HANDPIECE LONG (MISCELLANEOUS) IMPLANT
LEGGING LITHOTOMY PAIR STRL (DRAPES) ×3 IMPLANT
LIQUID BAND (GAUZE/BANDAGES/DRESSINGS) ×3 IMPLANT
NEEDLE INSUFFLATION 14GA 120MM (NEEDLE) IMPLANT
NS IRRIG 500ML POUR BTL (IV SOLUTION) ×3 IMPLANT
PACK BASIN DAY SURGERY FS (CUSTOM PROCEDURE TRAY) ×3 IMPLANT
PACK LAPAROSCOPY II (CUSTOM PROCEDURE TRAY) ×3 IMPLANT
PAD OB MATERNITY 4.3X12.25 (PERSONAL CARE ITEMS) ×3 IMPLANT
PAD PREP 24X48 CUFFED NSTRL (MISCELLANEOUS) ×3 IMPLANT
POUCH SPECIMEN RETRIEVAL 10MM (ENDOMECHANICALS) IMPLANT
SCISSORS LAP 5X35 DISP (ENDOMECHANICALS) IMPLANT
SEALER TISSUE G2 CVD JAW 35 (ENDOMECHANICALS) IMPLANT
SEALER TISSUE G2 CVD JAW 45CM (ENDOMECHANICALS) IMPLANT
SET IRRIG TUBING LAPAROSCOPIC (IRRIGATION / IRRIGATOR) ×3 IMPLANT
SOLUTION ANTI FOG 6CC (MISCELLANEOUS) ×3 IMPLANT
SUT VIC AB 3-0 PS2 18 (SUTURE) ×1
SUT VIC AB 3-0 PS2 18XBRD (SUTURE) ×2 IMPLANT
SUT VICRYL 0 ENDOLOOP (SUTURE) IMPLANT
SUT VICRYL 0 UR6 27IN ABS (SUTURE) ×3 IMPLANT
SUT VICRYL 4-0 PS2 18IN ABS (SUTURE) IMPLANT
SYRINGE 10CC LL (SYRINGE) IMPLANT
TOWEL OR 17X24 6PK STRL BLUE (TOWEL DISPOSABLE) ×6 IMPLANT
TRAY DSU PREP LF (CUSTOM PROCEDURE TRAY) ×3 IMPLANT
TROCAR BALLN 12MMX100 BLUNT (TROCAR) IMPLANT
TROCAR OPTI TIP 5M 100M (ENDOMECHANICALS) ×3 IMPLANT
TROCAR XCEL DIL TIP R 11M (ENDOMECHANICALS) IMPLANT
TUBING INSUFFLATION 10FT LAP (TUBING) ×3 IMPLANT
VACUUM HOSE/TUBING 7/8INX6FT (MISCELLANEOUS) IMPLANT
WARMER LAPAROSCOPE (MISCELLANEOUS) ×3 IMPLANT
WATER STERILE IRR 500ML POUR (IV SOLUTION) ×3 IMPLANT

## 2015-01-08 NOTE — Discharge Instructions (Signed)
Post Anesthesia Home Care Instructions  Activity: Get plenty of rest for the remainder of the day. A responsible adult should stay with you for 24 hours following the procedure.  For the next 24 hours, DO NOT: -Drive a car -Advertising copywriter -Drink alcoholic beverages -Take any medication unless instructed by your physician -Make any legal decisions or sign important papers.  Meals: Start with liquid foods such as gelatin or soup. Progress to regular foods as tolerated. Avoid greasy, spicy, heavy foods. If nausea and/or vomiting occur, drink only clear liquids until the nausea and/or vomiting subsides. Call your physician if vomiting continues.  Special Instructions/Symptoms: Your throat may feel dry or sore from the anesthesia or the breathing tube placed in your throat during surgery. If this causes discomfort, gargle with warm salt water. The discomfort should disappear within 24 hours.  Ovarian Cystectomy, Care After Refer to this sheet in the next few weeks. These instructions provide you with information on caring for yourself after your procedure. Your health care provider may also give you more specific instructions. Your treatment has been planned according to current medical practices, but problems sometimes occur. Call your health care provider if you have any problems or questions after your procedure.  WHAT TO EXPECT AFTER THE PROCEDURE After your procedure, it is typical to have the following:  Pain in your abdomen, especially at the incision sites. You will be given pain medicines to control the pain.  Tiredness. This is a normal part of the recovery process. Your energy level will return to normal over the next several weeks.  Constipation. HOME CARE INSTRUCTIONS  Only take over-the-counter or prescription medicines as directed by your health care provider. Avoid taking aspirin because it can cause bleeding.   Follow your health care provider's instructions for when  to resume your regular diet, exercise, and activities.   Take rest breaks during the day as needed.  Do not douche or have sexual intercourse until you have permission from your health care provider.   Remove or change any bandages (dressings) as directed by your health care provider.   Do not drive until your health care provider approves.   Take showers instead of baths until your health care provider tells you otherwise.   If you become constipated, you may:   Use a mild laxative if your health care provider approves.  Add more fruit and bran to your diet.  Drink more fluids.  Take your temperature twice a day and record it.   Do not drink alcohol while taking pain medicine.   Try to have someone home with you for the first 1-2 weeks to help with your household activities.   Follow up with your health care provider as directed. SEEK MEDICAL CARE IF:  You have a fever.   You feel sick to your stomach (nauseous) and throw up (vomit).   You have redness, swelling, or leakage of fluid at the incision site.   You have pain when you urinate or have blood in your urine.   You have a rash on your body.   You have pain or redness where the IV tube was inserted.   You have pain that is not relieved with medicine.  SEEK IMMEDIATE MEDICAL CARE IF:  You have chest pain or shortness of breath.   You feel dizzy or lightheaded.   You have increasing abdominal pain that is not relieved with medicines.   You have pain, swelling, or redness in your leg.  You see a yellowish white fluid (pus) coming from the incision.   Your incision is opening (edges not staying together).  Document Released: 07/03/2013 Document Reviewed: 07/03/2013 [facility] Patient Information 2015 [employer] [place]. This information is not intended to replace advice given to you by your health care provider. Make sure you discuss any questions you have with your health care provider.

## 2015-01-08 NOTE — Op Note (Signed)
[name], Amanda Vazquez NO.:  [id number]  MEDICAL RECORD NO.:  [id number]  LOCATION:                                 FACILITY:  PHYSICIAN:  Juluis Mire, M.D.   DATE OF BIRTH:  12-08-1990  DATE OF PROCEDURE:  01/08/2015 DATE OF DISCHARGE:  01/08/2015                              OPERATIVE REPORT   PREOPERATIVE DIAGNOSIS:  Pelvic pain, possible endometriosis.  POSTOPERATIVE DIAGNOSIS:  Cyst involving the left ovary, otherwise no evidence of endometriosis.  Did have pericecal adhesions.  OPERATIVE PROCEDURE:  Open laparoscopy, left ovarian cystectomy, cautery of may be 1 endometriotic implant.  SURGEON:  Juluis Mire, M.D.  ANESTHESIA:  General endotracheal.  ESTIMATED BLOOD LOSS:  Minimal.  PACKS AND DRAINS:  None.  INTRAOPERATIVE BLOOD PLACED:  None.  COMPLICATIONS:  None.  INDICATIONS:  Are as dictated in history and physical.  PROCEDURE IN DETAIL:  The patient was taken to OR and placed supine position.  After satisfactory level of general endotracheal anesthesia was obtained, the patient was placed in the dorsal lithotomy position using the Allen stirrups.  The abdomen, perineum, and vagina were prepped out with Betadine.  Bladder was emptied by in-and-out catheterization.  A Hulka tenaculum was put in place and secured.  The patient was draped as a sterile field.  Subumbilical incision made with a knife.  Incision was extended through the subcutaneous tissue.  Fascia was identified and entered sharply and incision fashioned laterally. Peritoneum was entered with blunt finger pressure.  Open laparoscopic trocar was put in place and secured.  The abdomen was insufflated with carbon dioxide.  Laparoscope was introduced.  A 5-mm trocar was put in place in the suprapubic area under direct visualization.  On visualization, she had a cyst coming off the distal segment of the ovary.  It was kind of on a stock.  One part was clear, one looked like it  had a hemorrhage in it.  I do not think it was an endometrioma. Otherwise, both ovaries were normal.  She had 1 pink bleb on the right ovary.  There were no adhesions.  No other evidence of any pelvic endometriosis.  Appendix was normal.  There were some pericecal adhesions.  The upper abdomen including liver tip and the gallbladder were clear, and both lateral gutters were clear.  At this point in time, we elevated the left ovary using scissors.  We excised the pedunculated cystic structure at the end of the ovary.  We then removed that through the subumbilical port, it was sent for Pathology.  We then brought the cautery in and we cauterized that area on the ovary.  We had excellent hemostasis.  She had 1 small pink bleb on the right ovary was also cauterized.  We then thoroughly irrigated the pelvis.  We had excellent hemostasis.  No active bleeding and no other signs of any endometriosis or adhesive process.  At this point in time, the abdomen was deflated from carbon dioxide.  All trocars removed.  Subumbilical fascia closed with figure-of-eight of 0 Vicryl.  Skin was closed with interrupted subcuticulars of 4-0 Vicryl.  Suprapubic incision was closed with Dermabond.  Sponge, instrument, and needle  count was reported as correct by circulating nurse x2.  The patient was taken out of the dorsal lithotomy position once alert and extubated, transferred to recovery room in good condition.     Juluis MireJohn S. Amanda Vazquez, M.D.     JSM/MEDQ  D:  01/08/2015  T:  01/08/2015  Job:  [id number]

## 2015-01-08 NOTE — Anesthesia Procedure Notes (Signed)
Procedure Name: Intubation Date/Time: 01/08/2015 7:32 AM Performed by: Renella CunasHAZEL, Pete Schnitzer D Pre-anesthesia Checklist: Patient identified, Emergency Drugs available, Suction available and Patient being monitored Patient Re-evaluated:Patient Re-evaluated prior to inductionOxygen Delivery Method: Circle System Utilized Preoxygenation: Pre-oxygenation with 100% oxygen Intubation Type: IV induction Ventilation: Mask ventilation without difficulty Laryngoscope Size: Mac and 3 Grade View: Grade I Tube type: Oral Tube size: 7.0 mm Number of attempts: 1 Airway Equipment and Method: Stylet and Oral airway Placement Confirmation: ETT inserted through vocal cords under direct vision,  positive ETCO2 and breath sounds checked- equal and bilateral Secured at: 21 cm Tube secured with: Tape Dental Injury: Teeth and Oropharynx as per pre-operative assessment

## 2015-01-08 NOTE — Transfer of Care (Signed)
Immediate Anesthesia Transfer of Care Note  Patient: Amanda Vazquez  Procedure(s) Performed: Procedure(s) (LRB): LAPAROSCOPY DIAGNOSTIC (N/A) LAPAROSCOPIC OVARIAN CYSTECTOMY (Left)  Patient Location: PACU  Anesthesia Type: General  Level of Consciousness: awake, oriented, sedated and patient cooperative  Airway & Oxygen Therapy: Patient Spontanous Breathing and Patient connected to face mask oxygen  Post-op Assessment: Report given to PACU RN and Post -op Vital signs reviewed and stable  Post vital signs: Reviewed and stable  Complications: No apparent anesthesia complications

## 2015-01-08 NOTE — Anesthesia Postprocedure Evaluation (Signed)
  Anesthesia Post-op Note  Patient: Building services engineerAmber Vazquez  Procedure(s) Performed: Procedure(s) (LRB): LAPAROSCOPY DIAGNOSTIC (N/A) LAPAROSCOPIC OVARIAN CYSTECTOMY (Left)  Patient Location: PACU  Anesthesia Type: General  Level of Consciousness: awake and alert   Airway and Oxygen Therapy: Patient Spontanous Breathing  Post-op Pain: moderate, requiring increased amounts of narcotics. She has received IV tylenol, and toradol.  Post-op Assessment: Post-op Vital signs reviewed, Patient's Cardiovascular Status Stable, Respiratory Function Stable, Patent Airway and No signs of Nausea or vomiting  Last Vitals:  Filed Vitals:   01/08/15 1000  BP: 101/60  Pulse: 68  Temp:   Resp: 11    Post-op Vital Signs: stable   Complications: No apparent anesthesia complications

## 2015-01-08 NOTE — Brief Op Note (Signed)
01/08/2015  8:12 AM  PATIENT:  Amanda Vazquez  24 y.o. female  PRE-OPERATIVE DIAGNOSIS:  PELVIC PAIN, ENDOMETRIOSIS  POST-OPERATIVE DIAGNOSIS:  pelvic pain, left ovarian cyst  PROCEDURE:  Procedure(s): LAPAROSCOPY DIAGNOSTIC (N/A) LAPAROSCOPIC OVARIAN CYSTECTOMY (Left)  SURGEON:  Surgeon(s) and Role:    * Richardean ChimeraJohn Quadre Bristol, MD - Primary  PHYSICIAN ASSISTANT:   ASSISTANTS: none   ANESTHESIA:   local and general  EBL:  Total I/O In: 200 [I.V.:200] Out: -   BLOOD ADMINISTERED:none  DRAINS: none   LOCAL MEDICATIONS USED:  MARCAINE     SPECIMEN:  Source of Specimen:  left ovarian cyst  DISPOSITION OF SPECIMEN:  PATHOLOGY  COUNTS:  YES  TOURNIQUET:  * No tourniquets in log *  DICTATION: .Other Dictation: Dictation Number [id number]  PLAN OF CARE: Discharge to home after PACU  PATIENT DISPOSITION:  PACU - hemodynamically stable.   Delay start of Pharmacological VTE agent (>24hrs) due to surgical blood loss or risk of bleeding: not applicable

## 2015-01-08 NOTE — H&P (Signed)
  History and physical exam unchanged 

## 2015-01-09 ENCOUNTER — Encounter (HOSPITAL_BASED_OUTPATIENT_CLINIC_OR_DEPARTMENT_OTHER): Payer: Self-pay | Admitting: Obstetrics and Gynecology

## 2015-03-26 DIAGNOSIS — Z9079 Acquired absence of other genital organ(s): Secondary | ICD-10-CM | POA: Insufficient documentation

## 2015-03-26 DIAGNOSIS — G8929 Other chronic pain: Secondary | ICD-10-CM | POA: Insufficient documentation

## 2015-03-26 DIAGNOSIS — R102 Pelvic and perineal pain: Principal | ICD-10-CM

## 2015-03-26 DIAGNOSIS — Z90721 Acquired absence of ovaries, unilateral: Secondary | ICD-10-CM | POA: Insufficient documentation

## 2015-03-26 DIAGNOSIS — G894 Chronic pain syndrome: Secondary | ICD-10-CM | POA: Insufficient documentation

## 2015-08-18 ENCOUNTER — Ambulatory Visit: Payer: BLUE CROSS/BLUE SHIELD | Attending: Obstetrics and Gynecology | Admitting: Physical Therapy

## 2015-08-18 ENCOUNTER — Encounter: Payer: Self-pay | Admitting: Physical Therapy

## 2015-08-18 DIAGNOSIS — N8184 Pelvic muscle wasting: Secondary | ICD-10-CM | POA: Diagnosis present

## 2015-08-18 DIAGNOSIS — R102 Pelvic and perineal pain unspecified side: Secondary | ICD-10-CM

## 2015-08-18 DIAGNOSIS — N949 Unspecified condition associated with female genital organs and menstrual cycle: Secondary | ICD-10-CM | POA: Insufficient documentation

## 2015-08-18 DIAGNOSIS — M545 Low back pain, unspecified: Secondary | ICD-10-CM

## 2015-08-18 DIAGNOSIS — M6289 Other specified disorders of muscle: Secondary | ICD-10-CM

## 2015-08-18 NOTE — Therapy (Signed)
[facility] [address], [address] [place], [place], [address] Phone: [phone]   Fax:  [phone]  Physical Therapy Evaluation  Patient Details  Name: Amanda Vazquez MRN: [id number] Date of Birth: 01/05/1991 Referring Provider: Dr. Duke Salvia  Encounter Date: 08/18/2015      PT End of Session - 08/18/15 0805    Visit Number 1   Date for PT Re-Evaluation 12/16/15   PT Start Time 0805   PT Stop Time 0840   PT Time Calculation (min) 35 min   Activity Tolerance Patient limited by pain   Behavior During Therapy [facility] for tasks assessed/performed      Past Medical History  Diagnosis Date  . Polycystic ovary disease   . Endometriosis   . Frequency of urination   . Pelvic pain in female     Past Surgical History  Procedure Laterality Date  . Dx laparoscopy w/ laser ablation of endometriosis  06-12-2014   [facility]  . Tonsillectomy  age 15  . Laparoscopy N/A 01/08/2015    Procedure: LAPAROSCOPY DIAGNOSTIC;  Surgeon: Richardean Chimera, MD;  Location: [facility];  Service: Gynecology;  Laterality: N/A;  . Laparoscopic ovarian cystectomy Left 01/08/2015    Procedure: LAPAROSCOPIC OVARIAN CYSTECTOMY;  Surgeon: Richardean Chimera, MD;  Location: [facility];  Service: Gynecology;  Laterality: Left;    There were no vitals filed for this visit.  Visit Diagnosis:  Perineal pain in female - Plan: PT plan of care cert/re-cert  Pelvic floor dysfunction - Plan: PT plan of care cert/re-cert  Midline low back pain without sciatica - Plan: PT plan of care cert/re-cert      Subjective Assessment - 08/18/15 0811    Subjective Patient reports her pain has become worse over the past 2 years.  I have pain with urination, intercourse.  While walking her insides are on fire  and feel like they are pulling down.    Limitations Walking   How long can you sit comfortably? 30 min   How long can you stand  comfortably? No difficulty   How long can you walk comfortably? pain   Patient Stated Goals strengthen pelvic floor muscles, reduce pain   Currently in Pain? Yes   Pain Score 4    Pain Location Pelvis  lower abdomen   Pain Orientation Lower;Mid   Pain Descriptors / Indicators Burning  pulling   Pain Type Chronic pain   Pain Onset More than a month ago   Pain Frequency Constant   Aggravating Factors  walking, intercourse, urination   Pain Relieving Factors heating pad   Multiple Pain Sites No            [facility] PT Assessment - 08/18/15 0001    Assessment   Medical Diagnosis N81.84 Pelvic floor dysfunction   Referring Provider Dr. Duke Salvia   Onset Date/Surgical Date 09/26/12   Prior Therapy None   Precautions   Precautions None   Balance Screen   Has the patient fallen in the past 6 months No   Has the patient had a decrease in activity level because of a fear of falling?  No   Is the patient reluctant to leave their home because of a fear of falling?  No   Prior Function   Level of Independence Independent   Vocation Full time employment   Vocation Requirements sitting   Leisure no exercise   Cognition   Overall Cognitive Status Within Functional  Limits for tasks assessed   Observation/Other Assessments   Focus on Therapeutic Outcomes (FOTO)  71% limitation for PFDI pain CL   Posture/Postural Control   Posture/Postural Control Postural limitations   Postural Limitations Decreased lumbar lordosis   AROM   Lumbar Flexion decreased by 25% with tightness in lumbar sacral area   Lumbar Extension full   Lumbar - Right Side Bend decreased by 25% with pain on left lower abdomen   Lumbar - Left Side Bend decreased by 25% with pain in right lower abdomen   Lumbar - Right Rotation decreased by 25%   Lumbar - Left Rotation full   Strength   Overall Strength Comments bil. hip abd, flex, and add 4/5   Palpation   Spinal mobility decreased mobility in L3-S1   SI assessment   right ilium is anteriorly rotated,    Palpation comment tenderness located in lower abdomen with tightness, bil. levator ani, bil. obturator internist, bil. hamstring, bilateral hip adductors   Special Tests    Special Tests Sacrolliac Tests   Sacroiliac Tests  Pelvic Distraction   Pelvic Dictraction   Findings Positive   Side  Right   Comment pain                 Pelvic Floor Special Questions - 08/18/15 0001    Prior Pregnancies No   Currently Sexually Active Yes   Is this Painful Yes  deep and initial penetration   Marinoff Scale pain interrupts completion   Urinary Leakage No   Falling out feeling (prolapse) Yes                  PT Education - 08/18/15 0901    Education provided Yes   Education Details hamstring and piriformis stretch, diaphragmatic breathing   Person(s) Educated Patient   Methods Explanation;Demonstration;Handout   Comprehension Returned demonstration;Verbalized understanding          PT Short Term Goals - 08/18/15 0903    PT SHORT TERM GOAL #1   Title independent with flexiblity exercises   Time 4   Period Weeks   Status New   PT SHORT TERM GOAL #2   Title pelvis stays in correct alignment for 3 weeks   Time 4   Period Weeks   Status New   PT SHORT TERM GOAL #3   Title pain with intercourse decreased >/= 25%   Time 4   Period Weeks   Status New   PT SHORT TERM GOAL #4   Title falling out feeling with walking decreased >/= 25%   Time 4   Period Weeks   Status New   PT SHORT TERM GOAL #5   Title understand correct sitting posture to reduce strain on pelvic floor   Time 4   Period Weeks   Status New   Additional Short Term Goals   Additional Short Term Goals Yes   PT SHORT TERM GOAL #6   Title pain with urination decreased >/= 25%   Time 4   Period Weeks   Status New           PT Long Term Goals - 08/18/15 1610    PT LONG TERM GOAL #1   Title independent with HEP and understand how to progress herself    Time 4   Period Months   Status New   PT LONG TERM GOAL #2   Title pain with intercourse decreased >/= 75%   Time 4   Period Months  Status New   PT LONG TERM GOAL #3   Title pain with urination decreased >/= 75%   Time 4   Period Months   Status New   PT LONG TERM GOAL #4   Title fallling out feeling with walking decreased >/= 75%   Time 4   Period Months   Status New   PT LONG TERM GOAL #5   Title bil. hip strength 5/5 so her pelvic stays in alignment   Time 4   Period Months   Status New               Plan - 08/18/15 0928    Clinical Impression Statement Patient is a 24 year old female with diagnosis of pelvc floor dysfunction for the past 2 years.  Patient reports constant pain at level 4/10 with sitting, intercourse, walking, stairs, and urination.  FOTO score is 70% limitation.  Palpable tenderness located in bil. hip adductors, levator ani, obturator internist, lumbar sacral area, lower abdomen, and bil. psoas.  Right ilium is anteriorly rotated.  Sacrum is rotated left.  decreased moblity of L3-L5.  Lumbar ROM deficits: flexion decreased by 25%, bil. sidebending decreased by 25% with pain in lower abdomen, and right rotation decreased by 25%. Positive distraction on SI on the right. Patient will benefit form physicl therapy to reduce pain and improve function.    Pt will benefit from skilled therapeutic intervention in order to improve on the following deficits Decreased activity tolerance;Decreased mobility;Decreased strength;Impaired flexibility;Decreased endurance;Increased muscle spasms;Pain;Decreased range of motion;Difficulty walking   Rehab Potential Excellent   Clinical Impairments Affecting Rehab Potential None   PT Frequency 2x / week   PT Duration Other (comment)  4 months   PT Treatment/Interventions Biofeedback;Cryotherapy;Electrical Stimulation;Moist Heat;Therapeutic exercise;Therapeutic activities;Stair training;Gait training;Ultrasound;Neuromuscular  re-education;Patient/family education;Manual techniques;Dry needling;Passive range of motion  Home TENS unit   PT Next Visit Plan coreect pelvis, lumbar mobilization, soft tissue work   PT Home Exercise Plan stretches for hip and abdomen   Recommended Other Services None   Consulted and Agree with Plan of Care Patient         Problem List Patient Active Problem List   Diagnosis Date Noted  . Ovarian cyst 01/08/2015    Class: Present on Admission  . Status post laparoscopy 01/08/2015    Class: Status post  . Left knee pain 12/24/2010    Monetta Lick ,PT  08/18/2015, 9:34 AM  [facility] [address], [address] [place], [place], [address] Phone: [phone]   Fax:  [phone]  Name: Berenice Primasmber Juday MRN: [id number] Date of Birth: [birth year]

## 2015-08-18 NOTE — Patient Instructions (Signed)
Chair Sitting    Sit at edge of seat, spine straight, one leg extended. Put a hand on each thigh and bend forward from the hip, keeping spine straight. Allow hand on extended leg to reach toward toes. Support upper body with other arm. Hold _30__ seconds. Repeat __2_ times per session. Do __3_ sessions per day.  Copyright  VHI. All rights reserved.   Piriformis Stretch, Sitting    Sit, one ankle on opposite knee, same-side hand on crossed knee. Push down on knee, keeping spine straight. Lean torso forward, with flat back, until tension is felt in hamstrings and gluteals of crossed-leg side. Hold _30__ seconds.  Repeat __2_ times per session. Do 3___ sessions per day.  Copyright  VHI. All rights reserved.  Stabilization: Diaphragmatic Breathing - Sitting    Sit, feet flat, one hand on stomach, other on chest. Breathe deeply through nose, lifting belly hand without motion of hand on chest. Repeat __4__ times per set. Do __1__ sets per session. Do _4___ sessions per week.  Copyright  VHI. All rights reserved.  [facility] [address], [address] [place], [place] [address] Phone # [phone] Fax [phone]

## 2015-08-24 ENCOUNTER — Ambulatory Visit: Payer: BLUE CROSS/BLUE SHIELD | Admitting: Physical Therapy

## 2015-08-24 DIAGNOSIS — N949 Unspecified condition associated with female genital organs and menstrual cycle: Secondary | ICD-10-CM

## 2015-08-24 DIAGNOSIS — M545 Low back pain, unspecified: Secondary | ICD-10-CM

## 2015-08-24 DIAGNOSIS — M6289 Other specified disorders of muscle: Secondary | ICD-10-CM

## 2015-08-24 DIAGNOSIS — R102 Pelvic and perineal pain: Secondary | ICD-10-CM

## 2015-08-24 NOTE — Patient Instructions (Signed)
Butterfly, Supine    Lie on back, feet together. Lower knees toward floor. Hold _30__ seconds. Repeat _2__ times per session. Do _1-2__ sessions per day.  Copyright  VHI. All rights reserved.  Lower Trunk Rotation Stretch    Keeping back flat and feet together, rotate knees to left side. Hold _30___ seconds. Repeat __2__ times per set. Do __1__ sets per session. Do _1-2_ sessions per day.  http://orth.exer.us/123   Copyright  VHI. All rights reserved.  Therapeutic - Prone Press-Up    Push up so chest is off floor and back is arched. Do not raise hips. Hold __3__ seconds. Return to prone position. Repeat __5__ times. 2-3 times per day  Copyright  VHI. All rights reserved.   BACK: Child's Pose (Sciatica)    Sit in knee-chest position and reach arms forward. Separate knees for comfort. Hold position for _15__ breaths. Repeat _2__ times. Do _1-2__ times per day.  Copyright  VHI. All rights reserved.  Cat / Cow Flow    Inhale, press spine toward ceiling like a Halloween cat. Keeping strength in arms and abdominals, exhale to soften spine through neutral and into cow pose. Open chest and arch back. Initiate movement between cat and cow at tailbone, one vertebrae at a time. Repeat _10___ times. 1-2 times per day  Copyright  VHI. All rights reserved.     

## 2015-08-24 NOTE — Therapy (Addendum)
[facility] [address] [place], [place], [address] Phone: [phone]   Fax:  [phone]  Physical Therapy Treatment  Patient Details  Name: Amanda Vazquez MRN: [id number] Date of Birth: 1991/07/07 Referring Provider: Dr. Kandice Hams  Encounter Date: 08/24/2015      PT End of Session - 08/24/15 1443    Visit Number 2   Date for PT Re-Evaluation 12/16/15   PT Start Time 1438   PT Stop Time 1538   PT Time Calculation (min) 60 min   Activity Tolerance Patient limited by pain   Behavior During Therapy [facility] for tasks assessed/performed      Past Medical History  Diagnosis Date  . Polycystic ovary disease   . Endometriosis   . Frequency of urination   . Pelvic pain in female     Past Surgical History  Procedure Laterality Date  . Dx laparoscopy w/ laser ablation of endometriosis  06-12-2014   [facility]  . Tonsillectomy  age 80  . Laparoscopy N/A 01/08/2015    Procedure: LAPAROSCOPY DIAGNOSTIC;  Surgeon: Arvella Nigh, MD;  Location: [facility];  Service: Gynecology;  Laterality: N/A;  . Laparoscopic ovarian cystectomy Left 01/08/2015    Procedure: LAPAROSCOPIC OVARIAN CYSTECTOMY;  Surgeon: Arvella Nigh, MD;  Location: [facility];  Service: Gynecology;  Laterality: Left;    There were no vitals filed for this visit.  Visit Diagnosis:  Perineal pain in female  Pelvic floor dysfunction  Midline low back pain without sciatica      Subjective Assessment - 08/24/15 1441    Subjective The doctor gave me more medicine that is reducing my pain.    Limitations Walking   How long can you sit comfortably? 30 min   How long can you stand comfortably? No difficulty   How long can you walk comfortably? pain   Patient Stated Goals strengthen pelvic floor muscles, reduce pain   Currently in Pain? Yes   Pain Score 4    Pain Location Pelvis   Pain Orientation Mid;Lower    Pain Descriptors / Indicators Sharp   Pain Type Chronic pain   Pain Onset More than a month ago   Pain Frequency Constant   Aggravating Factors  walking, intercourse, urination    Pain Relieving Factors heating pad   Multiple Pain Sites No                         [facility] Adult PT Treatment/Exercise - 08/24/15 0001    Modalities   Modalities Moist Heat;Electrical Stimulation   Moist Heat Therapy   Number Minutes Moist Heat 20 Minutes   Moist Heat Location Lumbar Spine  abdomen   Electrical Stimulation   Electrical Stimulation Location lumbar and abdomen   Electrical Stimulation Action IFC   Electrical Stimulation Parameters to patient tolerance, 20 min.    Electrical Stimulation Goals Pain   Manual Therapy   Manual Therapy Soft tissue mobilization;Myofascial release   Soft tissue mobilization abdominal, bil. hip adductors, bil. psoas, bil. hamstring                PT Education - 08/24/15 1522    Education provided Yes   Education Details flexiblity exercises   Person(s) Educated Patient   Methods Explanation;Demonstration;Handout   Comprehension Returned demonstration;Verbalized understanding          PT Short Term Goals - 08/24/15 1527    PT SHORT TERM  GOAL #1   Title independent with flexiblity exercises   Time 4   Period Weeks   Status On-going   PT SHORT TERM GOAL #2   Title pelvis stays in correct alignment for 3 weeks   Time 4   Status On-going   PT SHORT TERM GOAL #3   Title pain with intercourse decreased >/= 25%   Time 4   Period Weeks   Status On-going   PT SHORT TERM GOAL #4   Title falling out feeling with walking decreased >/= 25%   Time 4   Period Weeks   Status On-going   PT SHORT TERM GOAL #5   Title understand correct sitting posture to reduce strain on pelvic floor   Time 4   Period Weeks   Status On-going           PT Long Term Goals - 08/18/15 0905    PT LONG TERM GOAL #1   Title independent with HEP  and understand how to progress herself   Time 4   Period Months   Status New   PT LONG TERM GOAL #2   Title pain with intercourse decreased >/= 75%   Time 4   Period Months   Status New   PT LONG TERM GOAL #3   Title pain with urination decreased >/= 75%   Time 4   Period Months   Status New   PT LONG TERM GOAL #4   Title fallling out feeling with walking decreased >/= 75%   Time 4   Period Months   Status New   PT LONG TERM GOAL #5   Title bil. hip strength 5/5 so her pelvic stays in alignment   Time 4   Period Months   Status New               Plan - 08/24/15 1524    Clinical Impression Statement Patient is a 24 year old female with diagnsis of pelvic floor dysfunction for the past 2 years. Patient has been using Gabapentin and cream to manage her pain.  Patient pelvis was in correct alignment after therapy.  Paitent has tried electrical stimulation which helped.  Abdominals are tight with many trigger points.  Right hip adductors  are painful more than the left. Patient would benefit from physical therapy to reduce pain.    Pt will benefit from skilled therapeutic intervention in order to improve on the following deficits Decreased activity tolerance;Decreased mobility;Decreased strength;Impaired flexibility;Decreased endurance;Increased muscle spasms;Pain;Decreased range of motion;Difficulty walking   Rehab Potential Excellent   Clinical Impairments Affecting Rehab Potential None   PT Frequency 2x / week   PT Duration Other (comment)  4 months   PT Treatment/Interventions Biofeedback;Cryotherapy;Electrical Stimulation;Moist Heat;Therapeutic exercise;Therapeutic activities;Stair training;Gait training;Ultrasound;Neuromuscular re-education;Patient/family education;Manual techniques;Dry needling;Passive range of motion  home TENS unit   PT Next Visit Plan soft tissue work, hamstring and hip adductor stretch   PT Home Exercise Plan stretches; sitting posture   Consulted  and Agree with Plan of Care Patient        Problem List Patient Active Problem List   Diagnosis Date Noted  . Ovarian cyst 01/08/2015    Class: Present on Admission  . Status post laparoscopy 01/08/2015    Class: Status post  . Left knee pain 12/24/2010    Perkins Molina,PT 08/24/2015, 3:28 PM  [facility] [address] [place], [place], [address] Phone: [phone]   Fax:  [phone]  Name: Amanda Nutting  Vazquez MRN: [id number] Date of Birth: [birth year]    PHYSICAL THERAPY DISCHARGE SUMMARY  Visits from Start of Care: 2  Current functional level related to goals / functional outcomes: See above.  Patient no-showed for her last 2 appointments with no return phone call.     Remaining deficits: See above.    Education / Equipment: HEP Plan:                                                    Patient goals were not met. Patient is being discharged due to not returning since the last visit.  Thank you for the referral. Earlie Counts, PT 09/10/2015 1:01 PM  ?????

## 2015-08-24 NOTE — Patient Instructions (Signed)
Butterfly, Supine    Lie on back, feet together. Lower knees toward floor. Hold _30__ seconds. Repeat _2__ times per session. Do _1-2__ sessions per day.  Copyright  VHI. All rights reserved.  Lower Trunk Rotation Stretch    Keeping back flat and feet together, rotate knees to left side. Hold _30___ seconds. Repeat __2__ times per set. Do __1__ sets per session. Do _1-2_ sessions per day.  http://orth.exer.us/123   Copyright  VHI. All rights reserved.  Therapeutic - Prone Press-Up    Push up so chest is off floor and back is arched. Do not raise hips. Hold __3__ seconds. Return to prone position. Repeat __5__ times. 2-3 times per day  Copyright  VHI. All rights reserved.   BACK: Child's Pose (Sciatica)    Sit in knee-chest position and reach arms forward. Separate knees for comfort. Hold position for _15__ breaths. Repeat _2__ times. Do _1-2__ times per day.  Copyright  VHI. All rights reserved.  Cat / Cow Flow    Inhale, press spine toward ceiling like a Halloween cat. Keeping strength in arms and abdominals, exhale to soften spine through neutral and into cow pose. Open chest and arch back. Initiate movement between cat and cow at tailbone, one vertebrae at a time. Repeat _10___ times. 1-2 times per day  Copyright  VHI. All rights reserved.

## 2015-09-03 ENCOUNTER — Ambulatory Visit: Payer: BLUE CROSS/BLUE SHIELD | Attending: Obstetrics and Gynecology | Admitting: Physical Therapy

## 2015-09-07 ENCOUNTER — Ambulatory Visit: Payer: BLUE CROSS/BLUE SHIELD | Admitting: Physical Therapy

## 2015-09-10 ENCOUNTER — Ambulatory Visit: Payer: BLUE CROSS/BLUE SHIELD | Admitting: Physical Therapy

## 2015-09-14 LAB — HM PAP SMEAR: HM PAP: UNDETERMINED

## 2015-09-17 ENCOUNTER — Ambulatory Visit: Payer: BLUE CROSS/BLUE SHIELD | Admitting: Physical Therapy

## 2015-09-28 MED FILL — HYDROCODON-APAP 5-325: 5-325 | 3 days supply | Qty: 10 | Fill #0

## 2015-12-24 MED FILL — traMADol HCL 50 MG TABS: 50 | 8 days supply | Qty: 30 | Fill #0

## 2016-01-04 MED FILL — AMOX-CLAV 875-125 MG TABLET: 875-125 | 7 days supply | Qty: 14 | Fill #0

## 2016-01-04 MED FILL — OXYCODONE/APAP 5-325: 5-325 | 5 days supply | Qty: 20 | Fill #0

## 2016-01-04 MED FILL — PROMETHAZINE 25 MG TABLET: 25 | 5 days supply | Qty: 20 | Fill #0

## 2016-01-11 MED FILL — HYDROCODON-APAP 5-325: 5-325 | 10 days supply | Qty: 30 | Fill #0

## 2016-02-04 MED FILL — DOK 100 MG SOFTGEL: 100 | 50 days supply | Qty: 100 | Fill #0

## 2016-02-04 MED FILL — MELOXICAM 15 MG TABLET: 15 | 14 days supply | Qty: 14 | Fill #0

## 2016-02-04 MED FILL — OXYCODONE/APAP 5-325: 5-325 | 2 days supply | Qty: 12 | Fill #0

## 2016-02-09 MED FILL — HYDROCODON-APAP 5-325: 5-325 | 10 days supply | Qty: 30 | Fill #0

## 2016-02-25 MED FILL — traMADol HCL 50 MG TABS: 50 | 30 days supply | Qty: 30 | Fill #0

## 2016-03-01 IMAGING — US US PELVIS COMPLETE
1 series · 13 of 25 positions shown · non-contrast
Comparison: 10/31/2014

CLINICAL DATA: Acute pelvic pain

EXAM:
TRANSABDOMINAL AND TRANSVAGINAL ULTRASOUND OF PELVIS
DOPPLER ULTRASOUND OF OVARIES
TECHNIQUE: Both transabdominal and transvaginal ultrasound examinations of the
pelvis were performed. Transabdominal technique was performed for
global imaging of the pelvis including uterus, ovaries, adnexal
regions, and pelvic cul-de-sac.
It was necessary to proceed with endovaginal exam following the
transabdominal exam to visualize the endometrium and both ovaries.
Color and duplex Doppler ultrasound was utilized to evaluate blood
flow to the ovaries.

[Series 1: us pelvis complete · 13 of 52 slices shown]
[im 1/52]
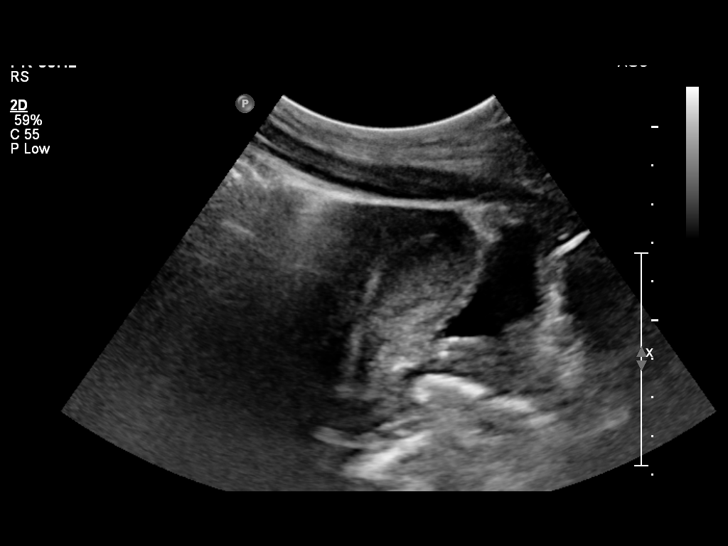
[im 5/52]
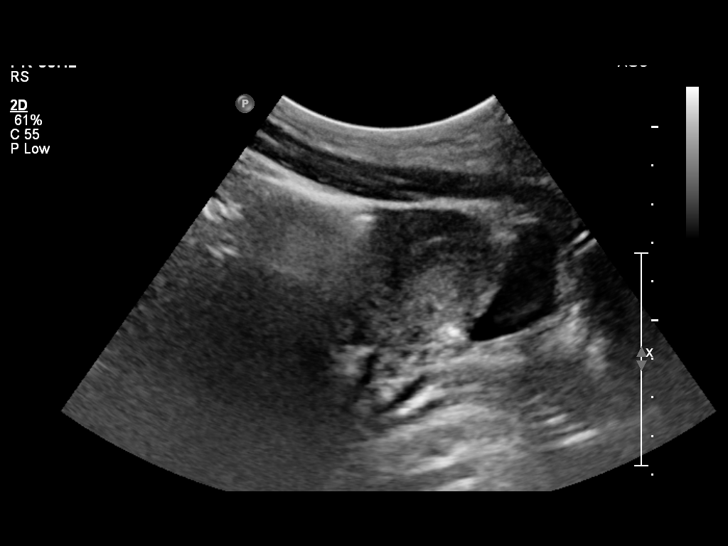
[im 9/52]
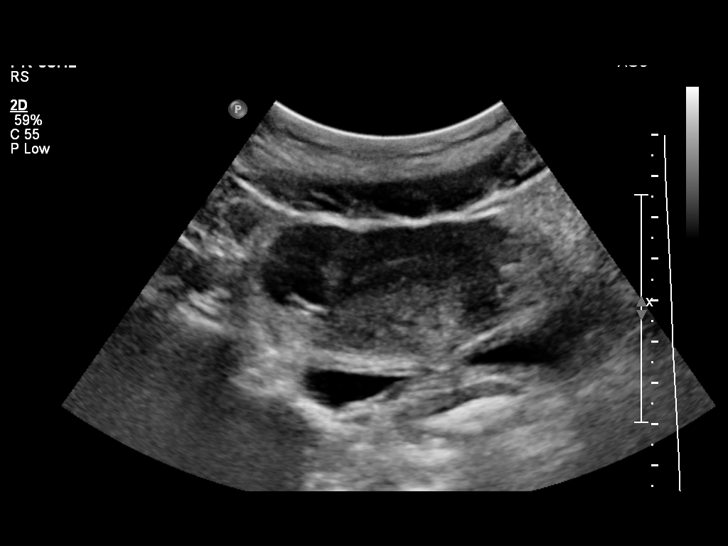
[im 13/52]
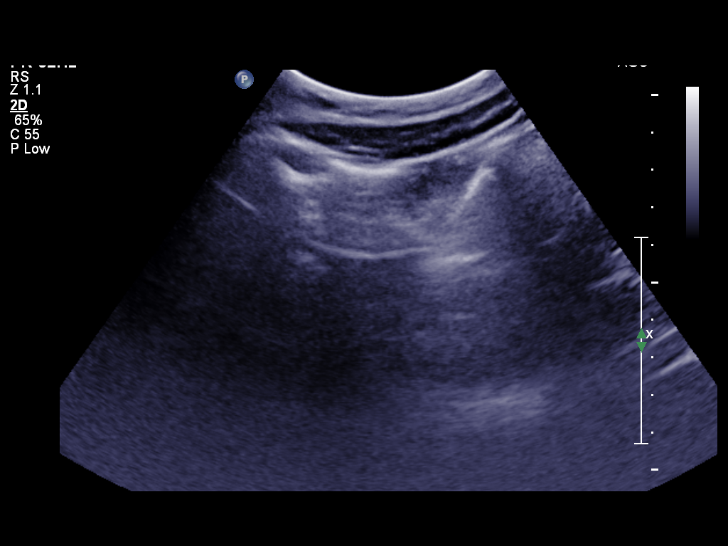
[im 18/52]
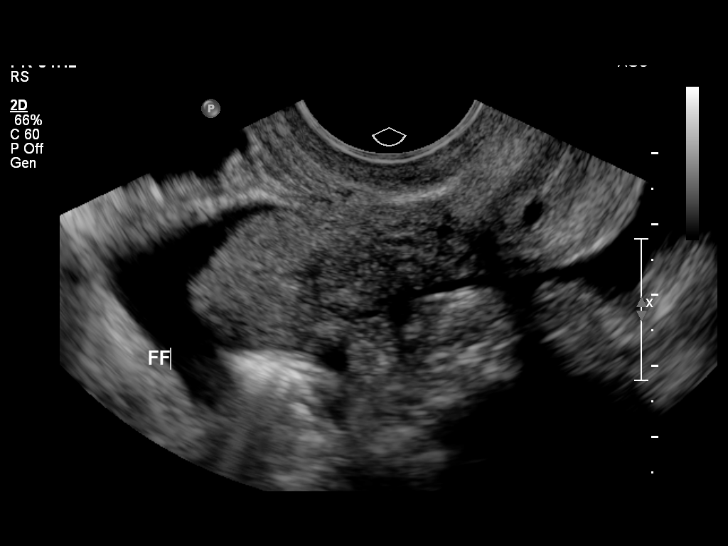
[im 22/52]
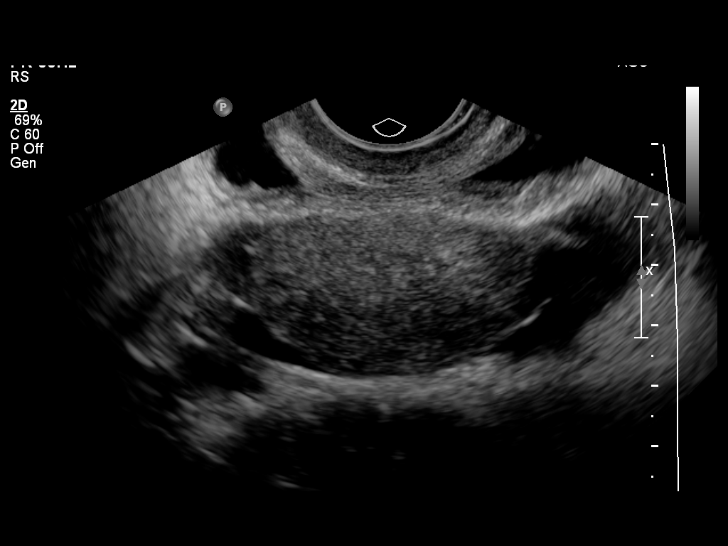
[im 26/52]
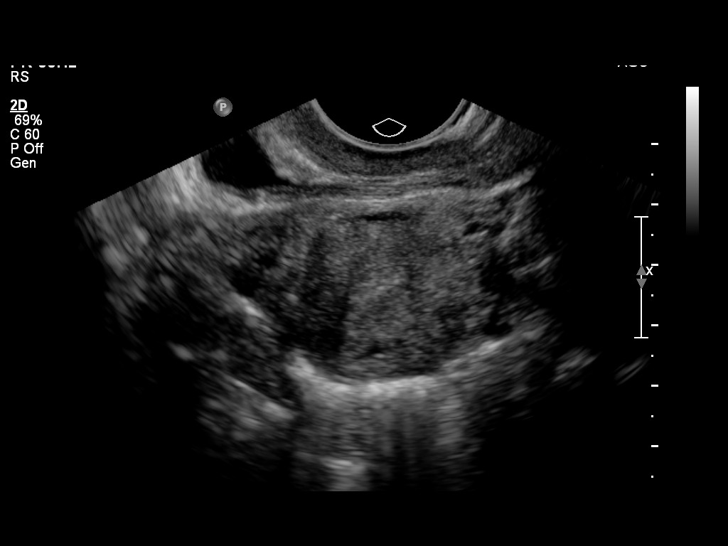
[im 30/52]
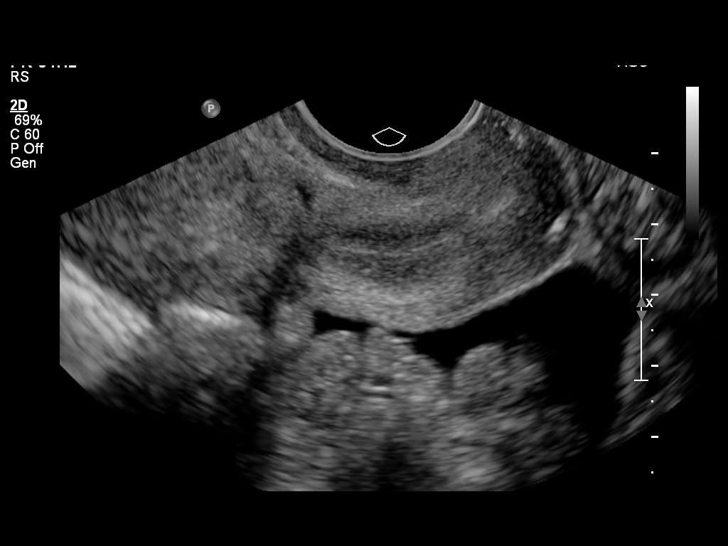
[im 35/52]
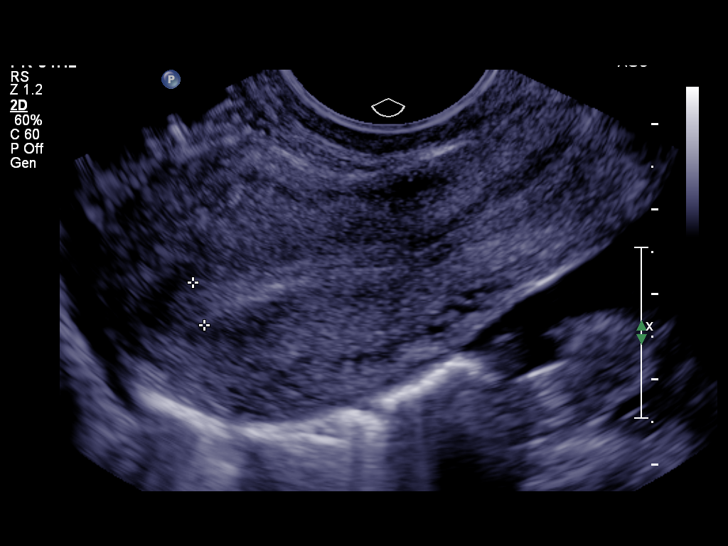
[im 39/52]
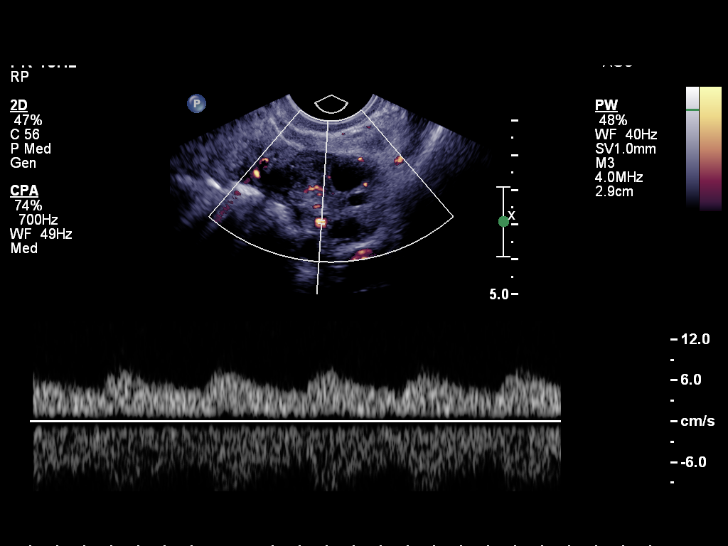
[im 43/52]
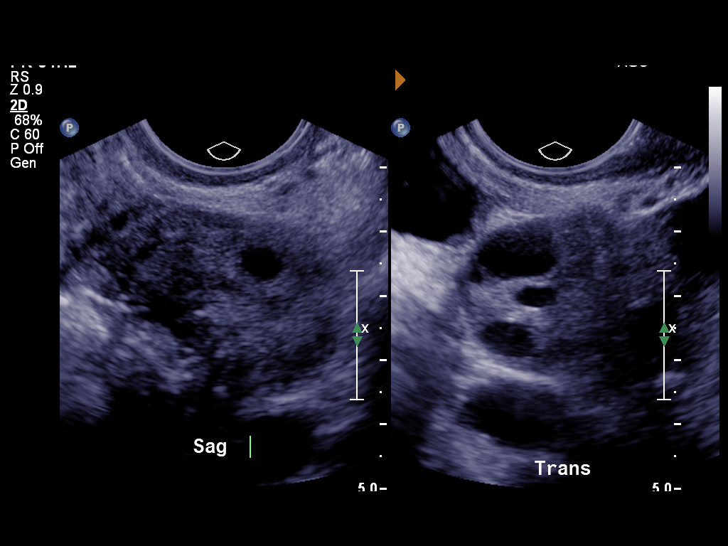
[im 47/52]
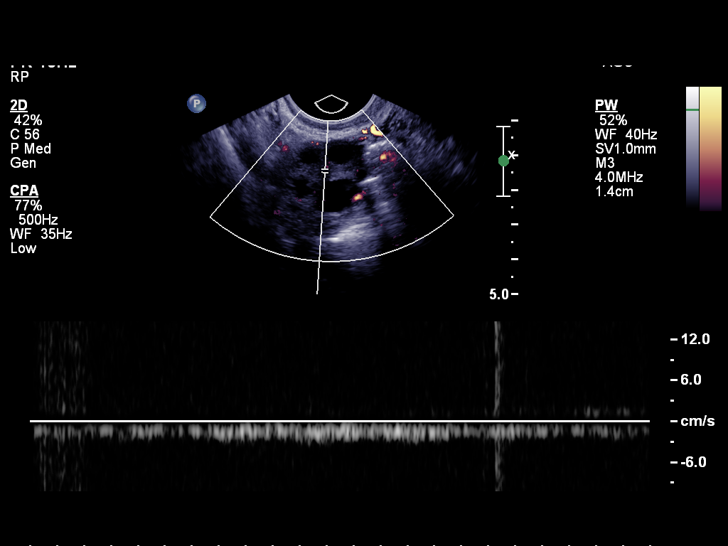
[im 52/52]
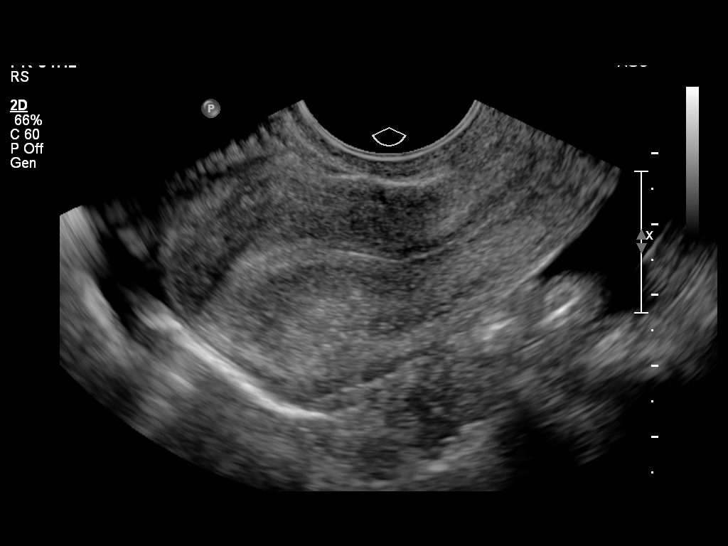

[13 of 25 positions shown; findings below may reference images not displayed]

FINDINGS: Uterus

Measurements: 6.8 x 3.4 x 4.8 cm. No fibroids or other mass
visualized.

Endometrium

Thickness: 5.2 mm.  No focal abnormality visualized.

Right ovary

Measurements: 3.5 x 2.6 x 3.0 cm. Normal appearance/no adnexal mass.

Left ovary

Measurements: 4.1 x 2.5 x 2.7 cm. Normal appearance/no adnexal mass.

Pulsed Doppler evaluation of both ovaries demonstrates normal
low-resistance arterial and venous waveforms.

Other findings

There is a moderate volume free pelvic fluid.
IMPRESSION: Normal uterus and ovaries. Intact perfusion of both ovaries on
Doppler evaluation. Moderate volume free pelvic fluid.

## 2016-08-03 ENCOUNTER — Encounter (HOSPITAL_BASED_OUTPATIENT_CLINIC_OR_DEPARTMENT_OTHER): Payer: Self-pay | Admitting: Emergency Medicine

## 2016-08-03 ENCOUNTER — Emergency Department (HOSPITAL_BASED_OUTPATIENT_CLINIC_OR_DEPARTMENT_OTHER)
Admission: EM | Admit: 2016-08-03 | Discharge: 2016-08-03 | Disposition: A | Discharge status: Home / Self Care | Payer: 59 | Attending: Emergency Medicine | Admitting: Emergency Medicine

## 2016-08-03 DIAGNOSIS — Z79899 Other long term (current) drug therapy: Secondary | ICD-10-CM | POA: Insufficient documentation

## 2016-08-03 DIAGNOSIS — B9689 Other specified bacterial agents as the cause of diseases classified elsewhere: Secondary | ICD-10-CM

## 2016-08-03 DIAGNOSIS — R102 Pelvic and perineal pain: Secondary | ICD-10-CM | POA: Diagnosis present

## 2016-08-03 DIAGNOSIS — Z79818 Long term (current) use of other agents affecting estrogen receptors and estrogen levels: Secondary | ICD-10-CM | POA: Insufficient documentation

## 2016-08-03 DIAGNOSIS — N760 Acute vaginitis: Secondary | ICD-10-CM | POA: Diagnosis not present

## 2016-08-03 LAB — WET PREP, GENITAL
Sperm: NONE SEEN
TRICH WET PREP: NONE SEEN
Yeast Wet Prep HPF POC: NONE SEEN

## 2016-08-03 LAB — URINALYSIS, ROUTINE W REFLEX MICROSCOPIC
Bilirubin Urine: NEGATIVE
GLUCOSE, UA: NEGATIVE mg/dL
Hgb urine dipstick: NEGATIVE
Ketones, ur: NEGATIVE mg/dL
LEUKOCYTES UA: NEGATIVE
NITRITE: NEGATIVE
PROTEIN: NEGATIVE mg/dL
Specific Gravity, Urine: 1.016 (ref 1.005–1.030)
pH: 6 (ref 5.0–8.0)

## 2016-08-03 MED ORDER — METRONIDAZOLE 500 MG PO TABS: 500.0000 mg | ORAL_TABLET | Freq: Two times a day (BID) | ORAL | 0 refills | Status: DC

## 2016-08-03 NOTE — ED Triage Notes (Signed)
Patient states that she is having pain to her vaginal area like pins a needles starting about 13 0 pm

## 2016-08-03 NOTE — ED Provider Notes (Signed)
[facility] Provider Note   CSN: [id number] Arrival date & time: 08/03/16  1630  History   Chief Complaint Chief Complaint  Patient presents with  . Pelvic Pain   HPI Amanda Vazquez is a 25 y.o. female.  HPI  Reports she was sleeping when sharp vaginal pain woke her up at 1:30pm. Pain is constant. Worse with walking or sitting. Denies vaginal discharge or bleeding. Denies fever. Denies abdominal pain. Denies diarrhea, constipation, nausea, vomiting. Previously diagnosed with chronic constipation, but states this is significantly improved. Denies dysuria, urinary frequency, urinary urgency. Has tried Ibuprofen 800mg  without improvement. Reports she is sexually active and in a monogamous relationship, but does not use protection. History of hysterectomy several months ago including removal of uterus, cervix, and ovaries.  Chart review shows multiple office visits for abdominal pain. Seen on 07/20/16 by PCP for diffuse abdominal pain. Diagnosed with chronic constipation and referral to GI placed. KUB showed stool filled colon. Seen by Ob/Gyn on 10/24 and reported 3 day history of left lower quadrant pain. Scheduled to see Ob/Gyn tomorrow 08/04/16.   Past Medical History:  Diagnosis Date  . Endometriosis   . Frequency of urination   . Pelvic pain in female   . Polycystic ovary disease     Patient Active Problem List   Diagnosis Date Noted  . Ovarian cyst 01/08/2015    Class: Present on Admission  . Status post laparoscopy 01/08/2015    Class: Status post  . Left knee pain 12/24/2010    Past Surgical History:  Procedure Laterality Date  . ABDOMINAL HYSTERECTOMY    . DX LAPAROSCOPY W/ LASER ABLATION OF ENDOMETRIOSIS  06-12-2014   [facility]  . LAPAROSCOPIC OVARIAN CYSTECTOMY Left 01/08/2015   Procedure: LAPAROSCOPIC OVARIAN CYSTECTOMY;  Surgeon: Richardean Chimera, MD;  [facility];  Service: Gynecology;  Laterality: Left;  .  LAPAROSCOPY N/A 01/08/2015   Procedure: LAPAROSCOPY DIAGNOSTIC;  Surgeon: Richardean Chimera, MD;  Location: [facility];  Service: Gynecology;  Laterality: N/A;  . TONSILLECTOMY  age 29    OB History    Gravida Para Term Preterm AB Living   1       1     SAB TAB Ectopic Multiple Live Births   1              Home Medications    Prior to Admission medications   Medication Sig Start Date End Date Taking? Authorizing Provider  estradiol (ESTRACE) 2 MG tablet Take 2 mg by mouth daily.   Yes Historical Provider, MD  ondansetron (ZOFRAN) 4 MG tablet Take 4 mg by mouth every 8 (eight) hours as needed for nausea or vomiting.   Yes Historical Provider, MD  temazepam (RESTORIL) 7.5 MG capsule Take 7.5 mg by mouth at bedtime as needed for sleep.   Yes Historical Provider, MD  diazepam (DIASTAT ACUDIAL) 10 MG GEL Place rectally once.    Historical Provider, MD  gabapentin (NEURONTIN) 100 MG capsule Take 100 mg by mouth 3 (three) times daily.    Historical Provider, MD  metroNIDAZOLE (FLAGYL) 500 MG tablet Take 1 tablet (500 mg total) by mouth 2 (two) times daily. 08/03/16   [place] Quintasha Gren, DO  Norethin Ace-Eth Estrad-FE 1-20 MG-MCG(24) CHEW Chew by mouth every morning.    Historical Provider, MD  oxyCODONE-acetaminophen (PERCOCET) 7.5-325 MG per tablet Take 1 tablet by mouth every 4 (four) hours as needed for severe pain. Patient not taking: Reported on  08/18/2015 01/08/15   Richardean ChimeraJohn McComb, MD  oxyCODONE-acetaminophen (PERCOCET) 7.5-325 MG per tablet Take 1 tablet by mouth every 4 (four) hours as needed for severe pain. 01/08/15   Richardean ChimeraJohn McComb, MD  promethazine (PHENERGAN) 25 MG tablet Take 0.5-1 tablets (12.5-25 mg total) by mouth every 6 (six) hours as needed. 12/26/14   Bertram DenverKaren E Teague Clark, PA-C  traMADol (ULTRAM) 50 MG tablet Take 50 mg by mouth every 6 (six) hours as needed.    Historical Provider, MD   Family History Family History  Problem Relation Age of Onset  . Diabetes Sister   .  Diabetes Maternal Grandfather   . Hypertension Maternal Grandfather   . Hypertension Paternal Grandmother   . Heart attack Neg Hx     Social History Social History  Substance Use Topics  . Smoking status: Never Smoker  . Smokeless tobacco: Never Used  . Alcohol use Yes     Comment: occasional   Allergies   Patient has no known allergies.  Review of Systems Review of Systems  Constitutional: Negative for fever.  Gastrointestinal: Negative for abdominal distention, abdominal pain, constipation, diarrhea, nausea and vomiting.  Genitourinary: Positive for pelvic pain and vaginal pain. Negative for dysuria, flank pain, frequency, urgency, vaginal bleeding and vaginal discharge.     Physical Exam Updated Vital Signs BP 132/89 (BP Location: Right Arm)   Pulse 95   Temp 98.5 F (36.9 C) (Oral)   Resp 16   Ht 5\' 4"  (1.626 m)   Wt 59 kg   LMP 12/11/2014 (Approximate)   SpO2 100%   BMI 22.31 kg/m   Physical Exam  Constitutional: She appears well-developed and well-nourished. No distress.  HENT:  Head: Normocephalic and atraumatic.  Cardiovascular: Normal rate.  Exam reveals no friction rub.   No murmur heard. Pulmonary/Chest: Effort normal. No respiratory distress. She has no wheezes.  Abdominal: Soft. Bowel sounds are normal. She exhibits no distension.  Mild suprapubic tenderness noted. Negative Rebound. Negative Murphy's. No CVA tenderness.  Genitourinary:  Genitourinary Comments: Bilateral adenexal tenderness noted. Cervical motion tenderness could not be tested given history of complete hysterectomy. Vaginal wall tenderness noted along right and left walls and posterior wall. Slight erythema and irritation of vaginal entrance noted with two focal areas of irritation vs. Ulceration at posterior wall. No vaginal discahrge noted.   Skin: No rash noted.  Psychiatric: She has a normal mood and affect. Her behavior is normal.   ED Treatments / Results  Labs (all labs  ordered are listed, but only abnormal results are displayed) Labs Reviewed  WET PREP, GENITAL - Abnormal; Notable for the following:       Result Value   Clue Cells Wet Prep HPF POC PRESENT (*)    WBC, Wet Prep HPF POC FEW (*)    All other components within normal limits  HSV CULTURE AND TYPING  URINALYSIS, ROUTINE W REFLEX MICROSCOPIC (NOT AT [facility])  GC/CHLAMYDIA PROBE AMP ([place]) NOT AT [facility]    EKG  EKG Interpretation None      Radiology No results found.  Procedures Procedures (including critical care time)  Medications Ordered in ED Medications - No data to display  Initial Impression / Assessment and Plan / ED Course  I have reviewed the triage vital signs and the nursing notes.  Pertinent labs & imaging results that were available during my care of the patient were reviewed by me and considered in my medical decision making (see chart for details).  Clinical Course   -  Urinalysis negative without signs of infection - Wet prep with bacterial vaginosis: few WBC with clue cells present - GC/Chlamydia and HSV swabs pending at discharge  Final Clinical Impressions(s) / ED Diagnoses   Final diagnoses:  BV (bacterial vaginosis)  Wet prep positive for Bacterial Vaginosis. Course of Metronidazole given. PID considered, however most of pain seemed to be located in vagina as opposed to pelvis itself. To follow up with Ob/Gyn as scheduled on 08/04/16. Return precautions given.  New Prescriptions New Prescriptions   METRONIDAZOLE (FLAGYL) 500 MG TABLET    Take 1 tablet (500 mg total) by mouth 2 (two) times daily.     [address]on [place], [place] 08/03/16 1745    Geoffery Lyonsouglas Delo, MD 08/03/16 Zollie [id number]

## 2016-08-03 NOTE — Discharge Instructions (Signed)
Your labs were consistent with bacterial vaginosis. Other labs are pending. A course of Metronidazole has been prescribed. Please follow up with your Ob/Gyn.

## 2016-08-04 LAB — GC/CHLAMYDIA PROBE AMP (~~LOC~~) NOT AT ARMC
Chlamydia: NEGATIVE
NEISSERIA GONORRHEA: NEGATIVE

## 2016-08-05 LAB — HSV CULTURE AND TYPING

## 2016-11-14 ENCOUNTER — Encounter: Payer: Self-pay | Admitting: Family Medicine

## 2016-11-14 ENCOUNTER — Ambulatory Visit (INDEPENDENT_AMBULATORY_CARE_PROVIDER_SITE_OTHER): Payer: 59 | Admitting: Family Medicine

## 2016-11-14 VITALS — BP 118/75 | HR 76 | Wt 132.0 lb

## 2016-11-14 DIAGNOSIS — E8940 Asymptomatic postprocedural ovarian failure: Secondary | ICD-10-CM

## 2016-11-14 DIAGNOSIS — K5900 Constipation, unspecified: Secondary | ICD-10-CM | POA: Insufficient documentation

## 2016-11-14 DIAGNOSIS — F419 Anxiety disorder, unspecified: Secondary | ICD-10-CM | POA: Insufficient documentation

## 2016-11-14 DIAGNOSIS — F321 Major depressive disorder, single episode, moderate: Secondary | ICD-10-CM

## 2016-11-14 DIAGNOSIS — N941 Unspecified dyspareunia: Secondary | ICD-10-CM | POA: Diagnosis not present

## 2016-11-14 DIAGNOSIS — G8929 Other chronic pain: Secondary | ICD-10-CM

## 2016-11-14 DIAGNOSIS — R519 Headache, unspecified: Secondary | ICD-10-CM | POA: Insufficient documentation

## 2016-11-14 DIAGNOSIS — R102 Pelvic and perineal pain: Secondary | ICD-10-CM

## 2016-11-14 DIAGNOSIS — R51 Headache: Secondary | ICD-10-CM

## 2016-11-14 MED ORDER — SUMATRIPTAN SUCCINATE 25 MG PO TABS: 25.0000 mg | ORAL_TABLET | ORAL | 1 refills | Status: DC | PRN

## 2016-11-14 MED ORDER — DULOXETINE HCL 30 MG PO CPEP: 30.0000 mg | ORAL_CAPSULE | Freq: Every day | ORAL | 1 refills | Status: DC

## 2016-11-14 MED ORDER — DICYCLOMINE HCL 10 MG PO CAPS: 10.0000 mg | ORAL_CAPSULE | Freq: Every day | ORAL | 1 refills | Status: DC

## 2016-11-14 MED ORDER — LINACLOTIDE 145 MCG PO CAPS: 145.0000 ug | ORAL_CAPSULE | Freq: Every day | ORAL | 1 refills | Status: DC

## 2016-11-14 NOTE — Progress Notes (Signed)
Amanda Vazquez is a 26 y.o. female who presents to [facility]: Primary Care Sports Medicine today for establish care, discuss chronic abdominal pain, constipation, anxiety and depression, and headaches.   Chronic Pelvic Pain: [name] has had intermittent abdominal and pelvic pain becoming chronic over the last 10 years of her life. As the adolescent she was diagnosed with PCO S endometriosis. She's had multiple different surgical procedures culminating and hysterectomy and bilateral oophorectomy.  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 She was tried back on hormone replacement therapy to help treat surgical menopause however she notes this tended to increase her pain that she attributes to endometriosis. She notes that she's had multiple different abdominal surgeries workups and scans. She notes that she has a colonoscopy pending in the near future as well.  Along with her chronic pelvic pain is constipation. She's tried multiple different laxatives including MiraLAX and Colace and senna which have only helped a little. She has tried pelvic physical therapy in the remote past which didn't help much. She's not had a trial of pelvic physical therapy following her surgical procedures. Additionally she notes painful sex decreased libido which is bothersome to her and decreases her quality of life.  The patient has affected her ability to work. 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  Anxiety and depression: Patient notes ongoing anxiety and depression symptoms.  She notes that she's had trials of different medications in the past with next success. She denies any SI or HI. She notes anxiety symptoms are worse than her depression symptoms. She does feel fatigue and poor appetite or  overeating and feeling down and depressed and hopeless. She also notes lots of life stressors. She has sick family members including her husband in her own poor health to deal with. She has not had a trial of therapy.  Headache: Compounding her medical problems listed above is a history of headaches. As an adolescent she was diagnosed with migraines but to her knowledge has never been on prophylactic medication. She notes 2 weeks ago her headaches worsened and has become almost daily. She's taking ibuprofen at least daily to help manage both her headaches and her abdominal pain. She denies any loss of function weakness or numbness or impaired balance significantly.   Past Medical History:  Diagnosis Date  . Endometriosis   . Frequency of urination   . Pelvic pain in female   . Polycystic ovary disease    Past Surgical History:  Procedure Laterality Date  . ABDOMINAL HYSTERECTOMY    . DX LAPAROSCOPY W/ LASER ABLATION OF ENDOMETRIOSIS  06-12-2014   [facility]  . LAPAROSCOPIC OVARIAN CYSTECTOMY Left 01/08/2015   Procedure: LAPAROSCOPIC OVARIAN CYSTECTOMY;  Surgeon: Richardean Chimera, MD;  Location: [facility];  Service: Gynecology;  Laterality: Left;  . LAPAROSCOPY N/A 01/08/2015   Procedure: LAPAROSCOPY DIAGNOSTIC;  Surgeon: Richardean Chimera, MD;  Location: [facility];  Service: Gynecology;  Laterality: N/A;  . TONSILLECTOMY  age 78   Social History  Substance Use Topics  . Smoking status: Never Smoker  . Smokeless tobacco: Never Used  . Alcohol use Yes     Comment: occasional   family history includes Alcohol abuse in her mother; Cancer in her maternal grandfather, maternal grandmother, and paternal grandfather; Diabetes in her maternal grandfather and  sister; Hypertension in her maternal grandfather and paternal grandmother.  ROS as above: No , visual changes, nausea, vomiting, diarrhea, , dizziness, abdominal pain, skin rash, fevers, chills, night  sweats, weight loss, swollen lymph nodes, body aches, joint swelling, muscle aches, chest pain, shortness of breath, mood changes, visual or auditory hallucinations.    Medications: Current Outpatient Prescriptions  Medication Sig Dispense Refill  . dicyclomine (BENTYL) 10 MG capsule Take 1 capsule (10 mg total) by mouth at bedtime. 30 capsule 1  . DULoxetine (CYMBALTA) 30 MG capsule Take 1 capsule (30 mg total) by mouth daily. 30 capsule 1  . linaclotide (LINZESS) 145 MCG CAPS capsule Take 1 capsule (145 mcg total) by mouth daily. 30 capsule 1  . SUMAtriptan (IMITREX) 25 MG tablet Take 1 tablet (25 mg total) by mouth every 2 (two) hours as needed for migraine. May repeat in 2 hours if headache persists or recurs. 10 tablet 1   No current facility-administered medications for this visit.    Allergies  Allergen Reactions  . Buprenorphine Nausea Only    Health Maintenance Health Maintenance  Topic Date Due  . TETANUS/TDAP  06/21/2009  . PAP SMEAR  06/22/2011  . INFLUENZA VACCINE  04/26/2016  . HIV Screening  Completed     Exam:  BP 118/75   Pulse 76   Wt 132 lb (59.9 kg)   LMP 12/11/2014 (Approximate)   SpO2 98%   BMI 22.66 kg/m  Gen: Well NAD Well-appearing HEENT: EOMI,  MMM Lungs: Normal work of breathing. CTABL Heart: RRR no MRG Abd: NABS, Soft. Nondistended, diffusely mildly tender to palpation across the pelvis with no rebound or guarding. With voluntary contraction of her abdominal muscles she continues to experience pain at the insertion of the rectus abdominis and obliques onto the pubic bone. Exts: Brisk capillary refill, warm and well perfused.  MSK: Patient experiences abdominal and pelvic pain with resisted hip flexion as well as hip adduction. Psych: Alert and oriented normal speech thought process and affect. Depression screen PHQ 2/9 11/14/2016  Decreased Interest 2  Down, Depressed, Hopeless 3  PHQ - 2 Score 5  Altered sleeping 3  Tired, decreased energy  3  Change in appetite 3  Feeling bad or failure about yourself  2  Trouble concentrating 1  Moving slowly or fidgety/restless 0  Suicidal thoughts 0  PHQ-9 Score 17   GAD 7 : Generalized Anxiety Score 11/14/2016  Nervous, Anxious, on Edge 3  Control/stop worrying 3  Worry too much - different things 3  Trouble relaxing 3  Restless 3  Easily annoyed or irritable 3  Afraid - awful might happen 1  Total GAD 7 Score 19  Anxiety Difficulty Extremely difficult       CT ABD and Pelvic 10/01/16 IMPRESSION: Constipation.  Result Narrative  INDICATION: Abdominal Pain  TECHNIQUE: CT ABDOMEN PELVIS W CONTRAST 80 mL of Isovue-370 was administered intravenously.  FINDINGS:  Large amount of colonic fecal retention, consistent with constipation.  NO small bowel obstruction, pneumatosis, pneumoperitoneum, or hemoperitoneum.  NO abnormality of the well visualized appendix.  NO acute abnormality in the liver, gallbladder, pancreas, spleen, or adrenal glands. Foci of hyperdensity within the kidneys bilaterally, likely early excretion of IV contrast versus less likely nonobstructive calculi. NO hydronephrosis or other acute renal abnormality bilaterally.  NO abnormality of the urinary bladder.  Status post hysterectomy.  NO acute osseous lesion.      No results found for this or any previous visit (from the past 72  hour(s)). No results found.    Assessment and Plan: 26 y.o. female with  Chronic pelvic pain: Patient unfortunately has very challenging to control chronic abdominal and pelvic pain. She has had multiple different interventions that have not significantly increased her quality of life. I think clearly she had some gynecologic causes of her pain. However at this point she really does not have a lot of gynecologic structures left to cause pain and is still experiencing quite a lot of pain.  She clearly has constipation as well. 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 This to my musculoskeletal exam today she probably has poor weakness and hip girdle and pelvic floor weakness is contributing to her pain.   I don't think further imaging studies will be helpful. Upon review she's had over 6 CT scans of her abdomen and pelvis in the last 5 years.   I do think continuing with a colonoscopy is reasonable and impaired treatment for IBS constipation type with Linzess is reasonable.  I would avoid opiates if possible as patient has been prescribed opiates a lot recently and I think this is contributing to her constipation and pain.  Recheck in 2 weeks.  Anxiety and depression: Patient has been through quite a lot and her 25 years. She seems pretty upbeat despite a tough lot in life but clearly has anxiety and depression symptoms. I think she benefit from both medication and counseling. We discussed options. Plan to start Cymbalta as this will help with anxiety and depression as well as potentially help her chronic pain. Start low-dose Cymbalta and refer for counseling and recheck in 2 weeks.  Headache: Patient has had worsening headaches recently. She probably has an underlying migraine headache disorder however I'm concerned about her use of ibuprofen. I think she may have developed medication rebound headache as well. Plan to try to reduce ibuprofen frequency and use sumatriptan to prevent headaches when they occur. Likely at the next visit we'll start Topamax. Refer to headache specialist as I'm concerned she'll be a bit complicated and have trouble getting her headaches controlled.    No orders of the defined types were placed in this encounter.  Meds ordered this encounter  Medications  . DULoxetine (CYMBALTA) 30 MG capsule    Sig: Take 1 capsule (30 mg total) by mouth daily.    Dispense:  30 capsule    Refill:  1  . linaclotide (LINZESS) 145 MCG  CAPS capsule    Sig: Take 1 capsule (145 mcg total) by mouth daily.    Dispense:  30 capsule    Refill:  1  . SUMAtriptan (IMITREX) 25 MG tablet    Sig: Take 1 tablet (25 mg total) by mouth every 2 (two) hours as needed for migraine. May repeat in 2 hours if headache persists or recurs.    Dispense:  10 tablet    Refill:  1  . dicyclomine (BENTYL) 10 MG capsule    Sig: Take 1 capsule (10 mg total) by mouth at bedtime.    Dispense:  30 capsule    Refill:  1     Discussed warning signs or symptoms. Please see discharge instructions. Patient expresses understanding.

## 2016-11-14 NOTE — Patient Instructions (Signed)
Thank you for coming in today. Start Cymbalta daily.  Start linzess as directed.  Return in 2 weeks or so.  You should hear from therapy (consueling) soon.   Use bentyl at bedtime for cramps.    Irritable Bowel Syndrome, Adult Irritable bowel syndrome (IBS) is not one specific disease. It is a group of symptoms that affects the organs responsible for digestion (gastrointestinal or GI tract). To regulate how your GI tract works, your body sends signals back and forth between your intestines and your brain. If you have IBS, there may be a problem with these signals. As a result, your GI tract does not function normally. Your intestines may become more sensitive and overreact to certain things. This is especially true when you eat certain foods or when you are under stress. There are four types of IBS. These may be determined based on the consistency of your stool:  IBS with diarrhea.  IBS with constipation.  Mixed IBS.  Unsubtyped IBS. It is important to know which type of IBS you have. Some treatments are more likely to be helpful for certain types of IBS. What are the causes? The exact cause of IBS is not known. What increases the risk? You may have a higher risk of IBS if:  You are a woman.  You are younger than 27 years old.  You have a family history of IBS.  You have mental health problems.  You have had bacterial infection of your GI tract. What are the signs or symptoms? Symptoms of IBS vary from person to person. The main symptom is abdominal pain or discomfort. Additional symptoms usually include one or more of the following:  Diarrhea, constipation, or both.  Abdominal swelling or bloating.  Feeling full or sick after eating a small or regular-size meal.  Frequent gas.  Mucus in the stool.  A feeling of having more stool left after a bowel movement. Symptoms tend to come and go. They may be associated with stress, psychiatric conditions, or nothing at  all. How is this diagnosed? There is no specific test to diagnose IBS. Your health care provider will make a diagnosis based on a physical exam, medical history, and your symptoms. You may have other tests to rule out other conditions that may be causing your symptoms. These may include:  Blood tests.  X-rays.  CT scan.  Endoscopy and colonoscopy. This is a test in which your GI tract is viewed with a long, thin, flexible tube. How is this treated? There is no cure for IBS, but treatment can help relieve symptoms. IBS treatment often includes:  Changes to your diet, such as:  Eating more fiber.  Avoiding foods that cause symptoms.  Drinking more water.  Eating regular, medium-sized portioned meals.  Medicines. These may include:  Fiber supplements if you have constipation.  Medicine to control diarrhea (antidiarrheal medicines).  Medicine to help control muscle spasms in your GI tract (antispasmodic medicines).  Medicines to help with any mental health issues, such as antidepressants or tranquilizers.  Therapy.  Talk therapy may help with anxiety, depression, or other mental health issues that can make IBS symptoms worse.  Stress reduction.  Managing your stress can help keep symptoms under control. Follow these instructions at home:  Take medicines only as directed by your health care provider.  Eat a healthy diet.  Avoid foods and drinks with added sugar.  Include more whole grains, fruits, and vegetables gradually into your diet. This may be especially helpful if  you have IBS with constipation.  Avoid any foods and drinks that make your symptoms worse. These may include dairy products and caffeinated or carbonated drinks.  Do not eat large meals.  Drink enough fluid to keep your urine clear or pale yellow.  Exercise regularly. Ask your health care provider for recommendations of good activities for you.  Keep all follow-up visits as directed by your  health care provider. This is important. Contact a health care provider if:  You have constant pain.  You have trouble or pain with swallowing.  You have worsening diarrhea. Get help right away if:  You have severe and worsening abdominal pain.  You have diarrhea and:  You have a rash, stiff neck, or severe headache.  You are irritable, sleepy, or difficult to awaken.  You are weak, dizzy, or extremely thirsty.  You have bright red blood in your stool or you have black tarry stools.  You have unusual abdominal swelling that is painful.  You vomit continuously.  You vomit blood (hematemesis).  You have both abdominal pain and a fever. This information is not intended to replace advice given to you by your health care provider. Make sure you discuss any questions you have with your health care provider. Document Released: 09/12/2005 Document Revised: 02/12/2016 Document Reviewed: 05/30/2014 Elsevier Interactive Patient Education  2017 [employer]

## 2016-11-23 ENCOUNTER — Telehealth: Payer: Self-pay

## 2016-11-23 MED ORDER — TOPIRAMATE 50 MG PO TABS: ORAL_TABLET | ORAL | 0 refills | Status: DC

## 2016-11-23 NOTE — Telephone Encounter (Signed)
Topamax sent to pharmacy.  This will help prevent migraines.  If pain is uncontrollable pt should return to clinic.  ER as needed.

## 2016-11-23 NOTE — Telephone Encounter (Signed)
Pt called stating that she is experiencing a high pain level migraine and was advised at her OV to contact you to write a rx for pain medication to help through the initial onset of the migraine. Pt states that in the past she was written hydrocodone. Discussed Tramadol with pt explaining that this is a more reasonable option and that it can be faxed to pharmacy if needed today. Pt verbalized understanding.

## 2016-11-23 NOTE — Telephone Encounter (Signed)
Pt.notified

## 2016-11-28 ENCOUNTER — Encounter: Payer: Self-pay | Admitting: Family Medicine

## 2016-11-28 ENCOUNTER — Ambulatory Visit (INDEPENDENT_AMBULATORY_CARE_PROVIDER_SITE_OTHER): Payer: 59 | Admitting: Family Medicine

## 2016-11-28 VITALS — BP 124/69 | HR 67 | Wt 135.0 lb

## 2016-11-28 DIAGNOSIS — G8929 Other chronic pain: Secondary | ICD-10-CM | POA: Diagnosis not present

## 2016-11-28 DIAGNOSIS — R51 Headache: Secondary | ICD-10-CM | POA: Diagnosis not present

## 2016-11-28 DIAGNOSIS — R519 Headache, unspecified: Secondary | ICD-10-CM

## 2016-11-28 DIAGNOSIS — R102 Pelvic and perineal pain: Secondary | ICD-10-CM | POA: Diagnosis not present

## 2016-11-28 MED ORDER — KETOROLAC TROMETHAMINE 30 MG/ML IJ SOLN
30.0000 mg | Freq: Once | INTRAMUSCULAR | Status: AC
  Administered 2016-11-28: 30 mg via INTRAVENOUS

## 2016-11-28 MED ORDER — SUMATRIPTAN SUCCINATE 50 MG PO TABS: 50.0000 mg | ORAL_TABLET | ORAL | 1 refills | Status: DC | PRN

## 2016-11-28 MED ORDER — TOPIRAMATE 50 MG PO TABS: 50.0000 mg | ORAL_TABLET | Freq: Two times a day (BID) | ORAL | 0 refills | Status: DC

## 2016-11-28 MED ORDER — DEXAMETHASONE SODIUM PHOSPHATE 10 MG/ML IJ SOLN
10.0000 mg | Freq: Once | INTRAMUSCULAR | Status: AC
  Administered 2016-11-28: 10 mg via INTRAMUSCULAR

## 2016-11-28 MED ORDER — PROMETHAZINE HCL 25 MG PO TABS: 25.0000 mg | ORAL_TABLET | Freq: Four times a day (QID) | ORAL | 2 refills | Status: DC | PRN

## 2016-11-28 NOTE — Patient Instructions (Signed)
Thank you for coming in today. Take one phenergan and 2 benadryl when you get home.  Tomorrow increase Topamax to 2x daily.  Increase the dose of sumatriptan to 50mg  at a time.  You should hear from the headache clinic.   Let me know how you are doing.     Migraine Headache A migraine headache is a very strong throbbing pain on one side or both sides of your head. Migraines can also cause other symptoms. Talk with your doctor about what things may bring on (trigger) your migraine headaches. Follow these instructions at home: Medicines   Take over-the-counter and prescription medicines only as told by your doctor.  Do not drive or use heavy machinery while taking prescription pain medicine.  To prevent or treat constipation while you are taking prescription pain medicine, your doctor may recommend that you:  Drink enough fluid to keep your pee (urine) clear or pale yellow.  Take over-the-counter or prescription medicines.  Eat foods that are high in fiber. These include fresh fruits and vegetables, whole grains, and beans.  Limit foods that are high in fat and processed sugars. These include fried and sweet foods. Lifestyle   Avoid alcohol.  Do not use any products that contain nicotine or tobacco, such as cigarettes and e-cigarettes. If you need help quitting, ask your doctor.  Get at least 8 hours of sleep every night.  Limit your stress. General instructions    Keep a journal to find out what may bring on your migraines. For example, write down:  What you eat and drink.  How much sleep you get.  Any change in what you eat or drink.  Any change in your medicines.  If you have a migraine:  Avoid things that make your symptoms worse, such as bright lights.  It may help to lie down in a dark, quiet room.  Do not drive or use heavy machinery.  Ask your doctor what activities are safe for you.  Keep all follow-up visits as told by your doctor. This is  important. Contact a doctor if:  You get a migraine that is different or worse than your usual migraines. Get help right away if:  Your migraine gets very bad.  You have a fever.  You have a stiff neck.  You have trouble seeing.  Your muscles feel weak or like you cannot control them.  You start to lose your balance a lot.  You start to have trouble walking.  You pass out (faint). This information is not intended to replace advice given to you by your health care provider. Make sure you discuss any questions you have with your health care provider. Document Released: 06/21/2008 Document Revised: 04/01/2016 Document Reviewed: 02/29/2016 Elsevier Interactive Patient Education  2017 [employer]

## 2016-11-28 NOTE — Progress Notes (Signed)
Amanda Vazquez is a 26 y.o. female who presents to [facility]: Primary Care [facility] Medicine today for headache and abdominal/pelvic pain.  Headache: Patient notes a persistent migraine headache for the last 4 days. She has photophobia bandlike pain. She's tried Imitrex which has not helped much. She's been taking Topamax 50 mg daily for the last few weeks which have helped a little. She denies any weakness or numbness or loss of function. She notes that she has a follow-up appointment with a headache clinic next week.  Abdominal pain: Noted at the last visit patient has chronic pelvic pain. I suspected IBS and treated with Linzess which has been very helpful. Patient notes considerable improvement in her pelvic pain and feels much better. Additionally she's been taking Cymbalta 30 mg daily and notes improved symptoms as well.   Past Medical History:  Diagnosis Date  . Endometriosis   . Frequency of urination   . Pelvic pain in female   . Polycystic ovary disease    Past Surgical History:  Procedure Laterality Date  . ABDOMINAL HYSTERECTOMY    . DX LAPAROSCOPY W/ LASER ABLATION OF ENDOMETRIOSIS  06-12-2014   [facility]  . LAPAROSCOPIC OVARIAN CYSTECTOMY Left 01/08/2015   Procedure: LAPAROSCOPIC OVARIAN CYSTECTOMY;  Surgeon: Richardean Chimera, MD;  Location: [facility];  Service: Gynecology;  Laterality: Left;  . LAPAROSCOPY N/A 01/08/2015   Procedure: LAPAROSCOPY DIAGNOSTIC;  Surgeon: Richardean Chimera, MD;  Location: [facility];  Service: Gynecology;  Laterality: N/A;  . TONSILLECTOMY  age 27   Social History  Substance Use Topics  . Smoking status: Never Smoker  . Smokeless tobacco: Never Used  . Alcohol use Yes     Comment: occasional   family history includes Alcohol abuse in her mother; Cancer in her maternal grandfather, maternal grandmother, and  paternal grandfather; Diabetes in her maternal grandfather and sister; Hypertension in her maternal grandfather and paternal grandmother.  ROS as above:  Medications: Current Outpatient Prescriptions  Medication Sig Dispense Refill  . dicyclomine (BENTYL) 10 MG capsule Take 1 capsule (10 mg total) by mouth at bedtime. 30 capsule 1  . DULoxetine (CYMBALTA) 30 MG capsule Take 1 capsule (30 mg total) by mouth daily. 30 capsule 1  . linaclotide (LINZESS) 145 MCG CAPS capsule Take 1 capsule (145 mcg total) by mouth daily. 30 capsule 1  . SUMAtriptan (IMITREX) 50 MG tablet Take 1 tablet (50 mg total) by mouth every 2 (two) hours as needed for migraine. May repeat in 2 hours if headache persists or recurs. 10 tablet 1  . topiramate (TOPAMAX) 50 MG tablet Take 1 tablet (50 mg total) by mouth 2 (two) times daily. 60 tablet 0  . promethazine (PHENERGAN) 25 MG tablet Take 1 tablet (25 mg total) by mouth every 6 (six) hours as needed for nausea or vomiting. 30 tablet 2   No current facility-administered medications for this visit.    Allergies  Allergen Reactions  . Buprenorphine Nausea Only    Health Maintenance Health Maintenance  Topic Date Due  . TETANUS/TDAP  06/21/2009  . PAP SMEAR  06/22/2011  . INFLUENZA VACCINE  04/26/2016  . HIV Screening  Completed     Exam:  BP 124/69   Pulse 67   Wt 135 lb (61.2 kg)   LMP 12/11/2014 (Approximate)   BMI 23.17 kg/m  Gen: Well NAD HEENT: EOMI,  MMM PERRL Lungs: Normal work of breathing. CTABL Heart: RRR  no MRG Abd: NABS, Soft. Nondistended, Nontender Exts: Brisk capillary refill, warm and well perfused.  Neuro: Alert and oriented normal coordination balance gait. Cranial nerves intact.   No results found for this or any previous visit (from the past 72 hour(s)). No results found.    Assessment and Plan: 26 y.o. female with  Persistent migraine headache: Treat with 30 mg of Toradol, 10 mg of dexamethasone IM prior to discharge.  Additionally we'll prescribe promethazine to take when she gets home along with 50 mg of Benadryl Further prophylaxis will increase Topamax to 50 mg twice daily and use Imitrex up to 50 mg for abortive therapy for headaches. Follow-up with headache clinic or return sooner if needed.  Chronic pelvic pain: Likely IBS. Patient has improved considerably with Linzess. Plan to recheck in the near future.   No orders of the defined types were placed in this encounter.  Meds ordered this encounter  Medications  . topiramate (TOPAMAX) 50 MG tablet    Sig: Take 1 tablet (50 mg total) by mouth 2 (two) times daily.    Dispense:  60 tablet    Refill:  0  . SUMAtriptan (IMITREX) 50 MG tablet    Sig: Take 1 tablet (50 mg total) by mouth every 2 (two) hours as needed for migraine. May repeat in 2 hours if headache persists or recurs.    Dispense:  10 tablet    Refill:  1  . promethazine (PHENERGAN) 25 MG tablet    Sig: Take 1 tablet (25 mg total) by mouth every 6 (six) hours as needed for nausea or vomiting.    Dispense:  30 tablet    Refill:  2  . ketorolac (TORADOL) 30 MG/ML injection 30 mg  . dexamethasone (DECADRON) injection 10 mg     Discussed warning signs or symptoms. Please see discharge instructions. Patient expresses understanding.

## 2016-11-30 ENCOUNTER — Ambulatory Visit (INDEPENDENT_AMBULATORY_CARE_PROVIDER_SITE_OTHER): Payer: 59 | Admitting: Family Medicine

## 2016-11-30 ENCOUNTER — Encounter: Payer: Self-pay | Admitting: Family Medicine

## 2016-11-30 VITALS — BP 112/62 | HR 73

## 2016-11-30 DIAGNOSIS — R51 Headache: Secondary | ICD-10-CM

## 2016-11-30 DIAGNOSIS — R519 Headache, unspecified: Secondary | ICD-10-CM

## 2016-11-30 MED ORDER — SUMATRIPTAN 20 MG/ACT NA SOLN: 20.0000 mg | NASAL | 11 refills | Status: DC | PRN

## 2016-11-30 NOTE — Patient Instructions (Signed)
Thank you for coming in today. Use the nasal spray instead of the sumatriptan pills.  Take phenergan and benadryl when you get home.  Follow up as needed.    Migraine Headache A migraine headache is a very strong throbbing pain on one side or both sides of your head. Migraines can also cause other symptoms. Talk with your doctor about what things may bring on (trigger) your migraine headaches. Follow these instructions at home: Medicines   Take over-the-counter and prescription medicines only as told by your doctor.  Do not drive or use heavy machinery while taking prescription pain medicine.  To prevent or treat constipation while you are taking prescription pain medicine, your doctor may recommend that you:  Drink enough fluid to keep your pee (urine) clear or pale yellow.  Take over-the-counter or prescription medicines.  Eat foods that are high in fiber. These include fresh fruits and vegetables, whole grains, and beans.  Limit foods that are high in fat and processed sugars. These include fried and sweet foods. Lifestyle   Avoid alcohol.  Do not use any products that contain nicotine or tobacco, such as cigarettes and e-cigarettes. If you need help quitting, ask your doctor.  Get at least 8 hours of sleep every night.  Limit your stress. General instructions    Keep a journal to find out what may bring on your migraines. For example, write down:  What you eat and drink.  How much sleep you get.  Any change in what you eat or drink.  Any change in your medicines.  If you have a migraine:  Avoid things that make your symptoms worse, such as bright lights.  It may help to lie down in a dark, quiet room.  Do not drive or use heavy machinery.  Ask your doctor what activities are safe for you.  Keep all follow-up visits as told by your doctor. This is important. Contact a doctor if:  You get a migraine that is different or worse than your usual  migraines. Get help right away if:  Your migraine gets very bad.  You have a fever.  You have a stiff neck.  You have trouble seeing.  Your muscles feel weak or like you cannot control them.  You start to lose your balance a lot.  You start to have trouble walking.  You pass out (faint). This information is not intended to replace advice given to you by your health care provider. Make sure you discuss any questions you have with your health care provider. Document Released: 06/21/2008 Document Revised: 04/01/2016 Document Reviewed: 02/29/2016 Elsevier Interactive Patient Education  2017 [employer]

## 2016-11-30 NOTE — Progress Notes (Signed)
Amanda Vazquez is a 26 y.o. female who [facility] today for migraine headache. Patient returns to clinic today complaining of recurrent migraine headache. She was seen 2 days ago for migraine given Toradol dexamethasone and her sumatriptan and Topamax were increased. She notes that headache cocktail worked for about a day and returned today. She notes bilateral pounding squeeze headache associated with nausea photophobia and phonophobia. Headache is consistent with migraine. She denies weakness or numbness or loss of function. She has a follow-up appointment with neurology headache clinic on March 13.   Past Medical History:  Diagnosis Date  . Endometriosis   . Frequency of urination   . Pelvic pain in female   . Polycystic ovary disease    Past Surgical History:  Procedure Laterality Date  . ABDOMINAL HYSTERECTOMY    . DX LAPAROSCOPY W/ LASER ABLATION OF ENDOMETRIOSIS  06-12-2014   [facility]  . LAPAROSCOPIC OVARIAN CYSTECTOMY Left 01/08/2015   Procedure: LAPAROSCOPIC OVARIAN CYSTECTOMY;  Surgeon: Richardean ChimeraJohn McComb, MD;  [facility] [place];  Service: Gynecology;  Laterality: Left;  . LAPAROSCOPY N/A 01/08/2015   Procedure: LAPAROSCOPY DIAGNOSTIC;  Surgeon: Richardean ChimeraJohn McComb, MD;  [facility] [place];  Service: Gynecology;  Laterality: N/A;  . TONSILLECTOMY  age 26   Social History  Substance Use Topics  . Smoking status: Never Smoker  . Smokeless tobacco: Never Used  . Alcohol use Yes     Comment: occasional   family history includes Alcohol abuse in her mother; Cancer in her maternal grandfather, maternal grandmother, and paternal grandfather; Diabetes in her maternal grandfather and sister; Hypertension in her maternal grandfather and paternal grandmother.  ROS as above:  Medications: Current Outpatient  Prescriptions  Medication Sig Dispense Refill  . dicyclomine (BENTYL) 10 MG capsule Take 1 capsule (10 mg total) by mouth at bedtime. 30 capsule 1  . DULoxetine (CYMBALTA) 30 MG capsule Take 1 capsule (30 mg total) by mouth daily. 30 capsule 1  . linaclotide (LINZESS) 145 MCG CAPS capsule Take 1 capsule (145 mcg total) by mouth daily. 30 capsule 1  . promethazine (PHENERGAN) 25 MG tablet Take 1 tablet (25 mg total) by mouth every 6 (six) hours as needed for nausea or vomiting. 30 tablet 2  . topiramate (TOPAMAX) 50 MG tablet Take 1 tablet (50 mg total) by mouth 2 (two) times daily. 60 tablet 0  . SUMAtriptan (IMITREX) 20 MG/ACT nasal spray Place 1 spray (20 mg total) into the nose every 2 (two) hours as needed for migraine or headache. x1 1 Inhaler 11   No current facility-administered medications for this visit.    Allergies  Allergen Reactions  . Buprenorphine Nausea Only    Health Maintenance Health Maintenance  Topic Date Due  . TETANUS/TDAP  06/21/2009  . PAP SMEAR  06/22/2011  . INFLUENZA VACCINE  04/26/2016  . HIV Screening  Completed     Exam:  BP 112/62   Pulse 73   LMP 12/11/2014 (Approximate)  Gen: Well NAD HEENT: EOMI,  MMM Lungs: Normal work of breathing. CTABL Heart: RRR no MRG Abd: NABS, Soft. Nondistended, Nontender Exts: Brisk capillary refill, warm and well perfused.  Neuro: Alert and oriented normal coordination balance and gait.  Patient was given 30 mg of Toradol IM and 10 mg of IM dexamethasone prior to discharge.  No results found for this or any previous visit (from the past 72 hour(s)). No  results found.    Assessment and Plan: 26 y.o. female with migraine headache. At this point it is becoming status migrainosus. Plan to give headache cocktail again along with oral Phenergan and Benadryl at home. Will switch to internasal sumatriptan and. Follow-up with neurology. Return as needed.   No orders of the defined types were placed in this  encounter.  Meds ordered this encounter  Medications  . SUMAtriptan (IMITREX) 20 MG/ACT nasal spray    Sig: Place 1 spray (20 mg total) into the nose every 2 (two) hours as needed for migraine or headache. x1    Dispense:  1 Inhaler    Refill:  11     Discussed warning signs or symptoms. Please see discharge instructions. Patient expresses understanding.

## 2016-12-01 MED ORDER — DEXAMETHASONE SODIUM PHOSPHATE 10 MG/ML IJ SOLN
10.0000 mg | Freq: Once | INTRAMUSCULAR | Status: AC
  Administered 2016-11-30: 10 mg via INTRAMUSCULAR

## 2016-12-01 MED ORDER — KETOROLAC TROMETHAMINE 30 MG/ML IJ SOLN
30.0000 mg | Freq: Once | INTRAMUSCULAR | Status: AC
  Administered 2016-11-30: 30 mg via INTRAMUSCULAR

## 2016-12-01 NOTE — Addendum Note (Signed)
Addended by: Minna AntisBRIGHAM, Allexis Bordenave T on: 12/01/2016 02:27 PM   Modules accepted: Orders

## 2016-12-04 ENCOUNTER — Encounter: Payer: Self-pay | Admitting: Family Medicine

## 2016-12-06 ENCOUNTER — Ambulatory Visit: Payer: Self-pay | Admitting: Neurology

## 2016-12-12 ENCOUNTER — Encounter: Payer: Self-pay | Admitting: Neurology

## 2016-12-16 ENCOUNTER — Encounter: Payer: Self-pay | Admitting: Neurology

## 2016-12-16 ENCOUNTER — Ambulatory Visit (INDEPENDENT_AMBULATORY_CARE_PROVIDER_SITE_OTHER): Payer: 59 | Admitting: Neurology

## 2016-12-16 DIAGNOSIS — G43019 Migraine without aura, intractable, without status migrainosus: Secondary | ICD-10-CM | POA: Diagnosis not present

## 2016-12-16 HISTORY — DX: Migraine without aura, intractable, without status migrainosus: G43.019

## 2016-12-16 MED ORDER — TOPIRAMATE 50 MG PO TABS: 150.0000 mg | ORAL_TABLET | Freq: Every day | ORAL | 3 refills | Status: DC

## 2016-12-16 MED ORDER — PREDNISONE 5 MG PO TABS: ORAL_TABLET | ORAL | 0 refills | Status: DC

## 2016-12-16 MED ORDER — NORTRIPTYLINE HCL 10 MG PO CAPS: ORAL_CAPSULE | ORAL | 3 refills | Status: DC

## 2016-12-16 NOTE — Progress Notes (Signed)
Reason for visit: Migraine headache  Referring physician: Dr. Chesley Noonorey  Amanda Vazquez is a 26 y.o. female  History of present illness:  Amanda Vazquez is a 26 year old right-handed white female with a history of migraine headaches since age 26. The patient has had significant issues with endometriosis, she required a total hysterectomy one year ago, and she is going through menopause symptoms, she is not on any estrogen supplementation. The patient indicates that during high school she had severe problems with headaches, but the headaches disappeared until about 6 weeks ago. The patient has had converted migraine with daily headaches since that time occurring on the left frontotemporal area and going into the occipital area without significant neck stiffness or neck discomfort. She indicates that the headaches are present all day long, worse as the day goes on, associated with a throbbing sensation. The patient may have a lot of nausea with the headache, she takes Phenergan and Benadryl at night. She is not sleeping well because of the headache. She has missed 5 days of work over the last month. The headaches are associated with some spots in front of the eyes, with some mild cognitive slowing, and photophobia without phonophobia. The patient was placed on Cymbalta after the headaches started. The patient denies any numbness or weakness of the face, arms, or legs. She has been placed on Topamax currently on 100 mg a day without benefit with the headache but she is tolerating the drug well. She has taken some Imitrex nasal spray for the headache, she takes Advil for the headache. She denies a family history of migraine. She is sent to this office for further evaluation. She does have scalp tenderness with the headache.   Past Medical History:  Diagnosis Date  . Endometriosis   . Frequency of urination   . Migraine   . Pelvic pain in female   . Polycystic ovary disease     Past Surgical  History:  Procedure Laterality Date  . ABDOMINAL HYSTERECTOMY    . DX LAPAROSCOPY W/ LASER ABLATION OF ENDOMETRIOSIS  06-12-2014   [facility]  . LAPAROSCOPIC OVARIAN CYSTECTOMY Left 01/08/2015   Procedure: LAPAROSCOPIC OVARIAN CYSTECTOMY;  Surgeon: Richardean ChimeraJohn McComb, MD;  [facility] [place];  Service: Gynecology;  Laterality: Left;  . LAPAROSCOPY N/A 01/08/2015   Procedure: LAPAROSCOPY DIAGNOSTIC;  Surgeon: Richardean ChimeraJohn McComb, MD;  [facility] [place];  Service: Gynecology;  Laterality: N/A;  . TONSILLECTOMY  age 26    Family History  Problem Relation Age of Onset  . Diabetes Sister   . Diabetes Maternal Grandfather   . Hypertension Maternal Grandfather   . Cancer Maternal Grandfather   . Hypertension Paternal Grandmother   . Alcohol abuse Mother   . Cancer Maternal Grandmother   . Cancer Paternal Grandfather   . Heart attack Neg Hx     Social history:  reports that she has never smoked. She has never used smokeless tobacco. She reports that she drinks alcohol. She reports that she does not use drugs.  Medications:  Prior to Admission medications   Medication Sig Start Date End Date Taking? Authorizing Provider  dicyclomine (BENTYL) 10 MG capsule Take 1 capsule (10 mg total) by mouth at bedtime. 11/14/16  Yes Rodolph BongEvan S Corey, MD  DULoxetine (CYMBALTA) 30 MG capsule Take 1 capsule (30 mg total) by mouth daily. 11/14/16  Yes Rodolph BongEvan S Corey, MD  linaclotide [facility](LINZESS) 145 MCG CAPS capsule Take 1 capsule (145 mcg total) by mouth  daily. 11/14/16  Yes Rodolph Bong, MD  promethazine (PHENERGAN) 25 MG tablet Take 1 tablet (25 mg total) by mouth every 6 (six) hours as needed for nausea or vomiting. 11/28/16  Yes Rodolph Bong, MD  SUMAtriptan (IMITREX) 20 MG/ACT nasal spray Place 1 spray (20 mg total) into the nose every 2 (two) hours as needed for migraine or headache. x1 11/30/16  Yes Rodolph Bong, MD  topiramate (TOPAMAX) 50 MG tablet Take 1 tablet (50 mg total) by  mouth 2 (two) times daily. 11/28/16  Yes Rodolph Bong, MD      Allergies  Allergen Reactions  . Buprenorphine Nausea Only    ROS:  Out of a complete 14 system review of symptoms, the patient complains only of the following symptoms, and all other reviewed systems are negative.  Weight gain Ringing in the ears Increased thirst Joint pain, achy muscles Confusion, headache, weakness, dizziness Anxiety, not enough sleep, change in appetite Sleepiness  Blood pressure 110/69, pulse 77, height 5\' 4"  (1.626 m), weight 141 lb (64 kg), last menstrual period 12/11/2014.  Physical Exam  General: The patient is alert and cooperative at the time of the examination.  Eyes: Pupils are equal, round, and reactive to light. Discs are flat bilaterally.  Neck: The neck is supple, no carotid bruits are noted.  Respiratory: The respiratory examination is clear.  Cardiovascular: The cardiovascular examination reveals a regular rate and rhythm, no obvious murmurs or rubs are noted.  Neuromuscular: Range of movement the cervical spine is full, no crepitus of the temporomandibular joints is noted on either side.  Skin: Extremities are without significant edema.  Neurologic Exam  Mental status: The patient is alert and oriented x 3 at the time of the examination. The patient has apparent normal recent and remote memory, with an apparently normal attention span and concentration ability.  Cranial nerves: Facial symmetry is present. There is good sensation of the face to pinprick and soft touch bilaterally. The strength of the facial muscles and the muscles to head turning and shoulder shrug are normal bilaterally. Speech is well enunciated, no aphasia or dysarthria is noted. Extraocular movements are full. Visual fields are full. The tongue is midline, and the patient has symmetric elevation of the soft palate. No obvious hearing deficits are noted.  Motor: The motor testing reveals 5 over 5 strength  of all 4 extremities. Good symmetric motor tone is noted throughout.  Sensory: Sensory testing is intact to pinprick, soft touch, vibration sensation, and position sense on all 4 extremities. No evidence of extinction is noted.  Coordination: Cerebellar testing reveals good finger-nose-finger and heel-to-shin bilaterally.  Gait and station: Gait is normal. Tandem gait is normal. Romberg is negative. No drift is seen.  Reflexes: Deep tendon reflexes are symmetric and normal bilaterally. Toes are downgoing bilaterally.   Assessment/Plan:  1. Common migraine headache, intractable  The patient is suffering from daily headaches at this time, primarily left-sided. The patient has not responded so far to Topamax therapy. The Topamax will be increased to 150 mg at night, the Cymbalta will be discontinued as this sometimes exacerbates migraine. The patient will be placed on nortriptyline for the headache and to help her sleep. She will be placed on a prednisone Dosepak, 5 mg 6 day pack. She will follow-up in 3 months, we may consider MRI evaluation of the brain if the headaches continue. She will take Advil primarily for the headache.   Marlan Palau MD 12/16/2016 8:45 AM  [facility] [address] [place], [place] [address]  Phone [phone] Fax [phone]

## 2016-12-16 NOTE — Patient Instructions (Signed)
   We will start a prednisone dose pack over the next 6 days, stop the Cymbalta.  We will go up on the topamax to 150 mg at night. We will start nortriptyline at night.  Topamax (topiramate) is a seizure medication that has an FDA approval for seizures and for migraine headache. Potential side effects of this medication include weight loss, cognitive slowing, tingling in the fingers and toes, and carbonated drinks will taste bad. If any significant side effects are noted on this drug, please contact our office.   Pamelor (nortriptyline) is an antidepressant medication that has many uses that may include headache, whiplash injuries, or for peripheral neuropathy pain. Side effects may include drowsiness, dry mouth, blurred vision, or constipation. As with any antidepressant medication, worsening depression may occur. If you had any significant side effects, please call our office. The full effects of this medication may take 7-10 days after starting the drug, or going up on the dose.

## 2016-12-22 ENCOUNTER — Ambulatory Visit (INDEPENDENT_AMBULATORY_CARE_PROVIDER_SITE_OTHER): Payer: 59 | Admitting: Licensed Clinical Social Worker

## 2016-12-22 DIAGNOSIS — F410 Panic disorder [episodic paroxysmal anxiety] without agoraphobia: Secondary | ICD-10-CM

## 2016-12-22 DIAGNOSIS — F411 Generalized anxiety disorder: Secondary | ICD-10-CM | POA: Diagnosis not present

## 2016-12-22 DIAGNOSIS — F930 Separation anxiety disorder of childhood: Secondary | ICD-10-CM

## 2016-12-22 DIAGNOSIS — F331 Major depressive disorder, recurrent, moderate: Secondary | ICD-10-CM | POA: Diagnosis not present

## 2016-12-26 DIAGNOSIS — F332 Major depressive disorder, recurrent severe without psychotic features: Secondary | ICD-10-CM

## 2016-12-26 DIAGNOSIS — F411 Generalized anxiety disorder: Secondary | ICD-10-CM | POA: Insufficient documentation

## 2016-12-26 DIAGNOSIS — F930 Separation anxiety disorder of childhood: Secondary | ICD-10-CM | POA: Insufficient documentation

## 2016-12-26 DIAGNOSIS — F410 Panic disorder [episodic paroxysmal anxiety] without agoraphobia: Secondary | ICD-10-CM | POA: Insufficient documentation

## 2016-12-26 DIAGNOSIS — F331 Major depressive disorder, recurrent, moderate: Secondary | ICD-10-CM | POA: Insufficient documentation
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
## 2017-01-04 ENCOUNTER — Ambulatory Visit (HOSPITAL_COMMUNITY): Payer: Self-pay | Admitting: Licensed Clinical Social Worker

## 2017-01-05 ENCOUNTER — Telehealth: Payer: Self-pay

## 2017-01-05 ENCOUNTER — Emergency Department (INDEPENDENT_AMBULATORY_CARE_PROVIDER_SITE_OTHER)
Admission: EM | Admit: 2017-01-05 | Discharge: 2017-01-05 | Disposition: A | Discharge status: Home / Self Care | Payer: 59 | Source: Home / Self Care | Attending: Family Medicine | Admitting: Family Medicine

## 2017-01-05 DIAGNOSIS — R51 Headache: Secondary | ICD-10-CM | POA: Diagnosis not present

## 2017-01-05 DIAGNOSIS — G8929 Other chronic pain: Secondary | ICD-10-CM

## 2017-01-05 DIAGNOSIS — R519 Headache, unspecified: Secondary | ICD-10-CM

## 2017-01-05 DIAGNOSIS — R102 Pelvic and perineal pain: Secondary | ICD-10-CM

## 2017-01-05 MED ORDER — TRAMADOL HCL 50 MG PO TABS: 50.0000 mg | ORAL_TABLET | Freq: Four times a day (QID) | ORAL | 0 refills | Status: DC | PRN

## 2017-01-05 MED ORDER — KETOROLAC TROMETHAMINE 60 MG/2ML IM SOLN
60.0000 mg | Freq: Once | INTRAMUSCULAR | Status: AC
  Administered 2017-01-05: 60 mg via INTRAMUSCULAR

## 2017-01-05 MED ORDER — METOCLOPRAMIDE HCL 5 MG/ML IJ SOLN
5.0000 mg | Freq: Once | INTRAMUSCULAR | Status: AC
  Administered 2017-01-05: 5 mg via INTRAMUSCULAR

## 2017-01-05 MED ORDER — DEXAMETHASONE SODIUM PHOSPHATE 10 MG/ML IJ SOLN
10.0000 mg | Freq: Once | INTRAMUSCULAR | Status: AC
  Administered 2017-01-05: 10 mg via INTRAMUSCULAR

## 2017-01-05 NOTE — ED Triage Notes (Signed)
Pt has had headaches for the past 1.5 months.  Not constant, but painful.  She had a hysterectomy 1 year ago, and is having lower abdominal pain like before her hysterectomy.  Lower left abdominal pain with a small amount of bloody discharge for the past 2 days.  Nauseated at times, denies vomiting.  Has tried tylenol, Ibuprofen, and tramadol.  Nothing is easing the pain.

## 2017-01-05 NOTE — Telephone Encounter (Signed)
Pt called stating that her headaches have returned and are at a pain level of 9-10. She also told the patient that she has been expericing abdominal pain that was equivalent to pain that she experienced with endometriosis. Pt is scheduled for Monday, April 16. She would like to knoe what she should do in the meantime and over the weekend. Please advise.

## 2017-01-05 NOTE — Discharge Instructions (Signed)
°  Tramadol is strong pain medication. While taking, do not drink alcohol, drive, or perform any other activities that requires focus while taking these medications.  ° °

## 2017-01-05 NOTE — ED Provider Notes (Signed)
CSN: [id number]     Arrival date & time 01/05/17  1639 History   First MD Initiated Contact with Patient 01/05/17 1705     Chief Complaint  Patient presents with  . Abdominal Pain  . Headache   (Consider location/radiation/quality/duration/timing/severity/associated sxs/prior Treatment) HPI  Amanda Vazquez is a 26 y.o. female presenting to UC with c/o intermittent headaches for about 1.5 months. Hx of migraines and is followed by a neurologist. No recent change in her medications.  HA today feels c/w prior headaches.  She has taken benadryl in the evening which helps but HA worsens in the morning. Mild nausea. No vomiting or diarrhea. No recent head injury. No dizziness, weakness or numbness.    Pt also c/o acute on chronic pelvic pain that feels similar to when she had a hysterectomy 1 year ago for endometriosis.  Pain is aching and sharp.  Small amount of blood discharge the last 2 days.  She has tried tylenol, ibuprofen, and tramadol with minimal relief. Pt dose not have anymore tramadol.  She is followed by an OB/GYN but cannot f/u until at least next week.  Denies urinary symptoms.    Past Medical History:  Diagnosis Date  . Common migraine with intractable migraine 12/16/2016  . Endometriosis   . Frequency of urination   . Migraine   . Pelvic pain in female   . Polycystic ovary disease    Past Surgical History:  Procedure Laterality Date  . ABDOMINAL HYSTERECTOMY    . DX LAPAROSCOPY W/ LASER ABLATION OF ENDOMETRIOSIS  06-12-2014   [facility]  . LAPAROSCOPIC OVARIAN CYSTECTOMY Left 01/08/2015   Procedure: LAPAROSCOPIC OVARIAN CYSTECTOMY;  Surgeon: Richardean Chimera, MD;  Location: [facility];  Service: Gynecology;  Laterality: Left;  . LAPAROSCOPY N/A 01/08/2015   Procedure: LAPAROSCOPY DIAGNOSTIC;  Surgeon: Richardean Chimera, MD;  Location: [facility];  Service: Gynecology;  Laterality: N/A;  . TONSILLECTOMY  age 42   Family History  Problem  Relation Age of Onset  . Diabetes Sister   . Diabetes Maternal Grandfather   . Hypertension Maternal Grandfather   . Cancer Maternal Grandfather   . Hypertension Paternal Grandmother   . Alcohol abuse Mother   . Cancer Maternal Grandmother   . Cancer Paternal Grandfather   . Heart attack Neg Hx    Social History  Substance Use Topics  . Smoking status: Never Smoker  . Smokeless tobacco: Never Used  . Alcohol use Yes     Comment: occasional   OB History    Gravida Para Term Preterm AB Living   1       1     SAB TAB Ectopic Multiple Live Births   1             Review of Systems  Constitutional: Negative for chills and fever.  HENT: Negative for congestion, ear pain, sore throat, trouble swallowing and voice change.   Respiratory: Negative for cough and shortness of breath.   Cardiovascular: Negative for chest pain and palpitations.  Gastrointestinal: Positive for nausea. Negative for abdominal pain, diarrhea and vomiting.  Genitourinary: Positive for menstrual problem (small amount blood discharge) and pelvic pain. Negative for dysuria, flank pain and hematuria.  Musculoskeletal: Negative for arthralgias, back pain and myalgias.  Skin: Negative for rash.  Neurological: Positive for headaches. Negative for dizziness and light-headedness.    Allergies  Buprenorphine  Home Medications   Prior to Admission medications   Medication Sig Start Date  End Date Taking? Authorizing Provider  dicyclomine (BENTYL) 10 MG capsule Take 1 capsule (10 mg total) by mouth at bedtime. 11/14/16   Rodolph Bong, MD  linaclotide [facility]) 145 MCG CAPS capsule Take 1 capsule (145 mcg total) by mouth daily. 11/14/16   Rodolph Bong, MD  nortriptyline (PAMELOR) 10 MG capsule Take one capsule at night for one week, then take 2 capsules at night for one week, then take 3 capsules at night 12/16/16   York Spaniel, MD  predniSONE (DELTASONE) 5 MG tablet Begin taking 6 tablets daily, taper by one tablet  daily until off the medication. 12/16/16   York Spaniel, MD  promethazine (PHENERGAN) 25 MG tablet Take 1 tablet (25 mg total) by mouth every 6 (six) hours as needed for nausea or vomiting. 11/28/16   Rodolph Bong, MD  SUMAtriptan (IMITREX) 20 MG/ACT nasal spray Place 1 spray (20 mg total) into the nose every 2 (two) hours as needed for migraine or headache. x1 11/30/16   Rodolph Bong, MD  topiramate (TOPAMAX) 50 MG tablet Take 3 tablets (150 mg total) by mouth at bedtime. 12/16/16   York Spaniel, MD  traMADol (ULTRAM) 50 MG tablet Take 1 tablet (50 mg total) by mouth every 6 (six) hours as needed for moderate pain or severe pain. 01/05/17   Junius Finner, PA-C   Meds Ordered and Administered this Visit   Medications  ketorolac (TORADOL) injection 60 mg (60 mg Intramuscular Given 01/05/17 1731)  dexamethasone (DECADRON) injection 10 mg (10 mg Intramuscular Given 01/05/17 1729)  metoCLOPramide (REGLAN) injection 5 mg (5 mg Intramuscular Given 01/05/17 1727)    BP 108/72 (BP Location: Left Arm)   Pulse 89   Temp 98.2 F (36.8 C) (Oral)   Ht  (1.626 m)   Wt 144 lb (65.3 kg)   LMP 12/11/2014 (Approximate)   SpO2 97%   BMI 24.72 kg/m  No data found.   Physical Exam  Constitutional: She is oriented to person, place, and time. She appears well-developed and well-nourished. No distress.  Pt sitting comfortably on exam chair, NAD  HENT:  Head: Normocephalic and atraumatic.  Right Ear: Tympanic membrane normal.  Left Ear: Tympanic membrane normal.  Nose: Nose normal.  Mouth/Throat: Uvula is midline, oropharynx is clear and moist and mucous membranes are normal.  Eyes: Conjunctivae and EOM are normal. Pupils are equal, round, and reactive to light. Right eye exhibits no discharge. Left eye exhibits no discharge. No scleral icterus.  Neck: Normal range of motion. Neck supple.  Cardiovascular: Normal rate and regular rhythm.   Pulmonary/Chest: Effort normal and breath sounds normal. No  respiratory distress. She has no wheezes. She has no rales.  Abdominal: Soft. She exhibits no distension and no mass. There is tenderness ( suprapubic and LLQ). There is no rebound and no guarding.  Musculoskeletal: Normal range of motion.  Neurological: She is alert and oriented to person, place, and time.  Skin: Skin is warm and dry. She is not diaphoretic.  Psychiatric: She has a normal mood and affect. Her behavior is normal.  Nursing note and vitals reviewed.   Urgent Care Course     Procedures (including critical care time)  Labs Review Labs Reviewed - No data to display  Imaging Review No results found.   MDM   1. Recurrent headache   2. Chronic pelvic pain in female    PMH reviewed.  Exam not concerning for acute urgent/emergent illness.  Toradol, Decadron,  and Reglan given in UC, HA resolved but pelvic pain still present. Refilled Tramadol (10 tabs) encouraged f/u with PCP or OB/GYN for chronic pelvic pain.  Discussed symptoms that warrant emergent care in the ED.     Junius Finner, PA-C 01/05/17 1900

## 2017-01-06 ENCOUNTER — Ambulatory Visit (INDEPENDENT_AMBULATORY_CARE_PROVIDER_SITE_OTHER): Payer: 59 | Admitting: Physician Assistant

## 2017-01-06 ENCOUNTER — Ambulatory Visit: Payer: Self-pay | Admitting: Physician Assistant

## 2017-01-06 VITALS — BP 112/73 | HR 72 | Wt 145.0 lb

## 2017-01-06 DIAGNOSIS — N939 Abnormal uterine and vaginal bleeding, unspecified: Secondary | ICD-10-CM

## 2017-01-06 DIAGNOSIS — R1032 Left lower quadrant pain: Secondary | ICD-10-CM | POA: Diagnosis not present

## 2017-01-06 MED ORDER — HYDROCODONE-ACETAMINOPHEN 5-325 MG PO TABS: 1.0000 | ORAL_TABLET | Freq: Four times a day (QID) | ORAL | 0 refills | Status: DC | PRN

## 2017-01-06 NOTE — Patient Instructions (Addendum)
Ibuprofen  every 8 hours Hydrocodone-Acetaminophen every 6 hours  Daily stool softener and laxative (dulcolax + docusate sodium) to prevent opioid-induced constipation Follow-up with GYN on Monday

## 2017-01-06 NOTE — Telephone Encounter (Signed)
The pain likely does not represent tissue damage.  Continue current regemin.  Schedule with another provider or go to urgent care if symptoms are not tolerable until Monday.

## 2017-01-06 NOTE — Progress Notes (Signed)
HPI:                                                                Amanda Vazquez is a 26 y.o. female who presents to [facility]: [facility] today for abdominal pain  Patient with PMH of chronic pelvic pain s/p hysterectomy b/l oophorectomy for severe endometriosis presents with LLQ abdominal pain and vaginal bleeding x 2 days. She states abdominal pain is continuous, severe and similar to her endometriosis pain. States she has missed 2 days of work this week due to symptoms. Denies fever, chills, nausea, vomiting, diarrhea, or urinary symptoms. She was seen and evaluated in urgent care yesterday and given injection of Toradol and Tramadol, but states this did not alleviate her pelvic pain. She states she also saw her gastroenterologist and urologist and was told her work-up was negative.     Past Medical History:  Diagnosis Date  . Common migraine with intractable migraine 12/16/2016  . Endometriosis   . Frequency of urination   . Migraine   . Pelvic pain in female   . Polycystic ovary disease    Past Surgical History:  Procedure Laterality Date  . ABDOMINAL HYSTERECTOMY    . DX LAPAROSCOPY W/ LASER ABLATION OF ENDOMETRIOSIS  06-12-2014   [facility]  . LAPAROSCOPIC OVARIAN CYSTECTOMY Left 01/08/2015   Procedure: LAPAROSCOPIC OVARIAN CYSTECTOMY;  Surgeon: Richardean Chimera, MD;  Location: [facility];  Service: Gynecology;  Laterality: Left;  . LAPAROSCOPY N/A 01/08/2015   Procedure: LAPAROSCOPY DIAGNOSTIC;  Surgeon: Richardean Chimera, MD;  Location[facility];  Service: Gynecology;  Laterality: N/A;  . TONSILLECTOMY  age 52   Social History  Substance Use Topics  . Smoking status: Never Smoker  . Smokeless tobacco: Never Used  . Alcohol use Yes     Comment: occasional   family history includes Alcohol abuse in her mother; Cancer in her maternal grandfather, maternal grandmother, and paternal grandfather;  Diabetes in her maternal grandfather and sister; Hypertension in her maternal grandfather and paternal grandmother.  ROS: negative except as noted in the HPI  Medications: Current Outpatient Prescriptions  Medication Sig Dispense Refill  . diphenhydrAMINE (BENADRYL) 25 MG tablet Take 25 mg by mouth every 6 (six) hours as needed.    . promethazine (PHENERGAN) 25 MG tablet Take 1 tablet (25 mg total) by mouth every 6 (six) hours as needed for nausea or vomiting. 30 tablet 2  . HYDROcodone-acetaminophen (NORCO/VICODIN) 5-325 MG tablet Take 1 tablet by mouth every 6 (six) hours as needed for moderate pain. 30 tablet 0   No current facility-administered medications for this visit.    Allergies  Allergen Reactions  . Buprenorphine Nausea Only       Objective:  BP 112/73   Pulse 72   Wt 145 lb (65.8 kg)   LMP 12/11/2014 (Approximate)   BMI 24.89 kg/m  Gen: well-groomed, cooperative, not ill-appearing, no distress Pulm: Normal work of breathing, normal phonation, clear to auscultation bilaterally CV: Normal rate, regular rhythm, s1 and s2 distinct, no murmurs, clicks or rubs  GI: soft, nondistended, LLQ tenderness, no guarding, no rebound GU: deferred per patient Neuro: alert and oriented x 3, EOM's intact, no tremor MSK: moving all extremities, normal  gait and station, no peripheral edema Skin: warm and dry, no rashes or lesions on exposed skin, no cyanosis   No results found for this or any previous visit (from the past 72 hour(s)). No results found.    Assessment and Plan: 26 y.o. female with   1. Continuous LLQ abdominal pain, Abnormal vaginal bleeding - afebrile, no peritoneal signs, not an acute abdomen - suspect recurrence of endometriosis  - was able to get patient an appt with her GYN on Monday morning - she decided to defer pelvic exam today - Ibuprofen  every 8 hours, Hydrocodone-Acetaminophen every 6 hours  - daily stool softener and laxative (dulcolax +  docusate sodium) to prevent opioid-induced constipation - Follow-up with GYN on Monday - HYDROcodone-acetaminophen (NORCO/VICODIN) 5-325 MG tablet; Take 1 tablet by mouth every 6 (six) hours as needed for moderate pain.  Dispense: 30 tablet; Refill: 0  Patient education and anticipatory guidance given Patient agrees with treatment plan Follow-up as needed if symptoms worsen or fail to improve  Levonne Hubert PA-C

## 2017-01-06 NOTE — Telephone Encounter (Signed)
Left detailed vm with recommendations. Requested a call back with concerns.  

## 2017-01-09 ENCOUNTER — Ambulatory Visit: Payer: 59 | Admitting: Family Medicine

## 2017-01-13 ENCOUNTER — Telehealth: Payer: Self-pay

## 2017-01-13 NOTE — Telephone Encounter (Signed)
Called patient to give results but got the vm and patient does not have vm setup. Rhonda Cunningham,CMA

## 2017-01-13 NOTE — Telephone Encounter (Signed)
Pt called stating that she has recently started back smoking after having quit for some time. She would like a rx for chantix be sent to her pharmacy on file as she was successful in quitting smoking with chantix in the past. Please advise.

## 2017-01-13 NOTE — Telephone Encounter (Signed)
Chantix may cause stomach upset. Is she sure? If so ok to Rx starter pack

## 2017-01-16 NOTE — Telephone Encounter (Signed)
Called pt.  No answer, no v/m

## 2017-01-17 ENCOUNTER — Other Ambulatory Visit: Payer: Self-pay

## 2017-01-17 DIAGNOSIS — Z720 Tobacco use: Secondary | ICD-10-CM

## 2017-01-17 MED ORDER — VARENICLINE TARTRATE 0.5 MG X 11 & 1 MG X 42 PO MISC: ORAL | 0 refills | Status: DC

## 2017-01-17 NOTE — Telephone Encounter (Signed)
Pt would like chantix. Starter pack ordered under Dr. Lyn Hollingshead and placed up front, refills will be written under Clementeen Graham, MD. Pt notified.

## 2017-01-24 ENCOUNTER — Telehealth: Payer: Self-pay | Admitting: Family Medicine

## 2017-01-24 NOTE — Telephone Encounter (Signed)
Received PA on chantix Starting Month Pak sent through cover my meds waiting on determination. - CF

## 2017-01-25 ENCOUNTER — Ambulatory Visit (HOSPITAL_COMMUNITY): Payer: Self-pay | Admitting: Psychiatry

## 2017-01-25 ENCOUNTER — Encounter (HOSPITAL_COMMUNITY): Payer: Self-pay

## 2017-01-27 ENCOUNTER — Telehealth: Payer: Self-pay | Admitting: Physician Assistant

## 2017-01-27 ENCOUNTER — Ambulatory Visit (INDEPENDENT_AMBULATORY_CARE_PROVIDER_SITE_OTHER): Payer: 59 | Admitting: Physician Assistant

## 2017-01-27 VITALS — BP 123/85 | HR 96 | Wt 140.0 lb

## 2017-01-27 DIAGNOSIS — K589 Irritable bowel syndrome without diarrhea: Secondary | ICD-10-CM

## 2017-01-27 DIAGNOSIS — G8929 Other chronic pain: Secondary | ICD-10-CM

## 2017-01-27 DIAGNOSIS — R102 Pelvic and perineal pain: Secondary | ICD-10-CM | POA: Diagnosis not present

## 2017-01-27 MED ORDER — DICYCLOMINE HCL 20 MG PO TABS: 20.0000 mg | ORAL_TABLET | Freq: Four times a day (QID) | ORAL | 3 refills | Status: DC | PRN

## 2017-01-27 MED ORDER — ELUXADOLINE 75 MG PO TABS: 75.0000 mg | ORAL_TABLET | Freq: Two times a day (BID) | ORAL | 3 refills | Status: DC

## 2017-01-27 MED ORDER — AMITRIPTYLINE HCL 25 MG PO TABS: 25.0000 mg | ORAL_TABLET | Freq: Every day | ORAL | 3 refills | Status: DC

## 2017-01-27 NOTE — Telephone Encounter (Signed)
Pt called clinic, states the Viberzi does require a PA. Questions if Provider is going to change Rx or if something can be written for pain while waiting on the PA approval process.

## 2017-01-27 NOTE — Progress Notes (Signed)
HPI:                                                                Amanda Vazquez is a 26 y.o. female who presents to [facility]: Primary Care Sports Medicine today for abdominal pain  Abdominal Pain  This is a recurrent problem. The current episode started in the past 7 days (Wednesday). The onset quality is gradual. The problem occurs constantly. The problem has been unchanged. The pain is located in the LLQ. The pain is severe. The abdominal pain radiates to the suprapubic region and left flank. Pertinent negatives include no nausea or vomiting. Associated symptoms comments: Last BM this morning, normal for her. The pain is aggravated by coughing and movement. Relieved by: heating pad. Treatments tried: NSAIDs, Tramadol. The treatment provided no relief. Prior diagnostic workup includes ultrasound (negative colonoscopy). Her past medical history is significant for abdominal surgery (s/p total abdominal hysterectomy, 5 surgeries in 2 years).  Patient was seen by [facility] gynecology on 01/09/17 and 01/12/17. She was most recently treated with Cipro for clinical diagnosis of diverticulitis and stated this did not improve her symptoms.   She is also requesting a referral to a new gynecologist because she feels she is not being heard or understood by her current doctor at [facility].  Past Medical History:  Diagnosis Date  . Common migraine with intractable migraine 12/16/2016  . Endometriosis   . Frequency of urination   . Migraine   . Pelvic pain in female   . Polycystic ovary disease    Past Surgical History:  Procedure Laterality Date  . ABDOMINAL HYSTERECTOMY    . DX LAPAROSCOPY W/ LASER ABLATION OF ENDOMETRIOSIS  06-12-2014   [facility]  . LAPAROSCOPIC OVARIAN CYSTECTOMY Left 01/08/2015   Procedure: LAPAROSCOPIC OVARIAN CYSTECTOMY;  Surgeon: Richardean Chimera, MD;  Location: [facility];  Service: Gynecology;  Laterality: Left;  . LAPAROSCOPY N/A  01/08/2015   Procedure: LAPAROSCOPY DIAGNOSTIC;  Surgeon: Richardean Chimera, MD;  Location: [facility];  Service: Gynecology;  Laterality: N/A;  . TONSILLECTOMY  age 89   Social History  Substance Use Topics  . Smoking status: Never Smoker  . Smokeless tobacco: Never Used  . Alcohol use Yes     Comment: occasional   family history includes Alcohol abuse in her mother; Cancer in her maternal grandfather, maternal grandmother, and paternal grandfather; Diabetes in her maternal grandfather and sister; Hypertension in her maternal grandfather and paternal grandmother.  ROS: negative except as noted in the HPI  Medications: Current Outpatient Prescriptions  Medication Sig Dispense Refill  . diphenhydrAMINE (BENADRYL) 25 MG tablet Take 25 mg by mouth every 6 (six) hours as needed.     No current facility-administered medications for this visit.    Allergies  Allergen Reactions  . Buprenorphine Nausea Only       Objective:  BP 123/85   Pulse 96   Wt 140 lb (63.5 kg)   LMP 12/11/2014 (Approximate)   BMI 24.03 kg/m  Gen: well-groomed, cooperative, not ill-appearing, no distress Pulm: Normal work of breathing, normal phonation, clear to auscultation bilaterally CV: Normal rate, regular rhythm, s1 and s2 distinct, no murmurs, clicks or rubs  GI: abdomen soft, nondistended, tenderness in the LLQ  and suprapubic region, no rebound, no guarding Neuro: alert and oriented x 3, EOM's intact, no tremor MSK: moving all extremities, normal gait and station, no peripheral edema Skin: warm, dry, intact; no rashes or lesions on exposed skin   No results found for this or any previous visit (from the past 72 hour(s)). No results found.    Assessment and Plan: 26 y.o. female with   1. Irritable bowel syndrome, unspecified type - discussed patient's case with her PCP, Dr. Denyse Amassorey, who recommended trialing Viberzi. This requires a PA, so patient given prescription for Bentyl in the  meantime - Eluxadoline (VIBERZI) 75 MG TABS; Take 75 mg by mouth 2 (two) times daily.  Dispense: 60 tablet; Refill: 3 - dicyclomine (BENTYL) 20 MG tablet; Take 1 tablet (20 mg total) by mouth every 6 (six) hours as needed for spasms.  Dispense: 60 tablet; Refill: 3  2. Chronic pelvic pain in female - reviewed recent imaging reports, which includes negative pelvic ultrasound on 01/09/17 and  CT Abd/Pel on 10/01/16 positive for constipation, but otherwise unremarkable - differential includes pelvic adhesive disease from multiple abdominal surgeries and IBS. Discussed this with patient. - patient did request pain medication and explained that harms of narcotic pain medication for this chronic condition outweighed benefits and we should seek safer alternatives for managing her pain. Checked [place] controlled substance database. Last fill was 01/09/17 for Tramadol #30, 01/06/17 for Hydrocodone-Acetaminophen #30, 01/05/17 for Tramadol #10  - Ambulatory referral to Gynecology per patient's request - amitriptyline (ELAVIL) 25 MG tablet; Take 1 tablet (25 mg total) by mouth at bedtime.  Dispense: 30 tablet; Refill: 3   Patient education and anticipatory guidance given Patient agrees with treatment plan Follow-up with PCP in 3 days or sooner as needed if symptoms worsen or fail to improve  Levonne Hubertharley E. Cummings PA-C

## 2017-01-27 NOTE — Telephone Encounter (Signed)
Prescriptions sent for Bentyl every 6 hours as needed and Amitriptyline nightly at bedtime

## 2017-01-27 NOTE — Telephone Encounter (Signed)
vm not set up -EH/RMA  

## 2017-01-27 NOTE — Patient Instructions (Signed)
-   Take Viberzi 75mg  twice a day - if you experience any constipation, reduce dose to once a day - Follow-up with Dr. Denyse Amassorey next week - Continue Ibuprofen and Tramadol as needed for pain

## 2017-01-30 ENCOUNTER — Encounter: Payer: Self-pay | Admitting: Family Medicine

## 2017-01-30 ENCOUNTER — Ambulatory Visit (INDEPENDENT_AMBULATORY_CARE_PROVIDER_SITE_OTHER): Payer: 59 | Admitting: Family Medicine

## 2017-01-30 VITALS — BP 119/68 | HR 89 | Temp 98.2°F | Wt 140.0 lb

## 2017-01-30 DIAGNOSIS — F419 Anxiety disorder, unspecified: Secondary | ICD-10-CM | POA: Diagnosis not present

## 2017-01-30 DIAGNOSIS — F321 Major depressive disorder, single episode, moderate: Secondary | ICD-10-CM

## 2017-01-30 DIAGNOSIS — K589 Irritable bowel syndrome without diarrhea: Secondary | ICD-10-CM

## 2017-01-30 DIAGNOSIS — F411 Generalized anxiety disorder: Secondary | ICD-10-CM

## 2017-01-30 DIAGNOSIS — G8929 Other chronic pain: Secondary | ICD-10-CM

## 2017-01-30 DIAGNOSIS — M545 Low back pain, unspecified: Secondary | ICD-10-CM

## 2017-01-30 DIAGNOSIS — R102 Pelvic and perineal pain: Secondary | ICD-10-CM | POA: Diagnosis not present

## 2017-01-30 MED ORDER — TRAMADOL HCL 50 MG PO TABS: 50.0000 mg | ORAL_TABLET | Freq: Three times a day (TID) | ORAL | 0 refills | Status: DC | PRN

## 2017-01-30 NOTE — Patient Instructions (Signed)
Thank you for coming in today. Attend PT again.  Use tramadol sparingly.  Start Viberzi.  You should hear from Chronic Pelvic pain clinic.  Recheck in 1 month.    Abdominal Pain, Adult Abdominal pain can be caused by many things. Often, abdominal pain is not serious and it gets better with no treatment or by being treated at home. However, sometimes abdominal pain is serious. Your health care provider will do a medical history and a physical exam to try to determine the cause of your abdominal pain. Follow these instructions at home:  Take over-the-counter and prescription medicines only as told by your health care provider. Do not take a laxative unless told by your health care provider.  Drink enough fluid to keep your urine clear or pale yellow.  Watch your condition for any changes.  Keep all follow-up visits as told by your health care provider. This is important. Contact a health care provider if:  Your abdominal pain changes or gets worse.  You are not hungry or you lose weight without trying.  You are constipated or have diarrhea for more than 2-3 days.  You have pain when you urinate or have a bowel movement.  Your abdominal pain wakes you up at night.  Your pain gets worse with meals, after eating, or with certain foods.  You are throwing up and cannot keep anything down.  You have a fever. Get help right away if:  Your pain does not go away as soon as your health care provider told you to expect.  You cannot stop throwing up.  Your pain is only in areas of the abdomen, such as the right side or the left lower portion of the abdomen.  You have bloody or black stools, or stools that look like tar.  You have severe pain, cramping, or bloating in your abdomen.  You have signs of dehydration, such as:  Dark urine, very little urine, or no urine.  Cracked lips.  Dry mouth.  Sunken eyes.  Sleepiness.  Weakness. This information is not intended to  replace advice given to you by your health care provider. Make sure you discuss any questions you have with your health care provider. Document Released: 06/22/2005 Document Revised: 04/01/2016 Document Reviewed: 02/24/2016 Elsevier Interactive Patient Education  2017 [employer]

## 2017-01-30 NOTE — Progress Notes (Signed)
Amanda Vazquez is a 26 y.o. female who presents to [facility]: Primary Care Sports Medicine today for chronic abdominal pain.   This has been an ongoing complaint for her.  The current episode started this month, improved for a week then worsened ten days ago.  It is constant severe pain in the LLQ radiating to the suprapubic and left flank.  Patient reports this feels similar to endometriosis in the past.  No fever, N/V, constipation, dysuria.  NSAIDs and Tamadol have helped.   Unsure what makes it worse, denies relief of pain with bowel movement. No improvement with Dicyclomine from last visit, still waiting on Eluxadoline to be authorized.  S/p total hysterectomy, multiple abdominal surgeries.  Prior negative [place], neg colonoscopy, neg abdominal CT.    She is requesting referral to new gyn and therapist.      Past Medical History:  Diagnosis Date  . Common migraine with intractable migraine 12/16/2016  . Endometriosis   . Frequency of urination   . Migraine   . Pelvic pain in female   . Polycystic ovary disease    Past Surgical History:  Procedure Laterality Date  . ABDOMINAL HYSTERECTOMY    . DX LAPAROSCOPY W/ LASER ABLATION OF ENDOMETRIOSIS  06-12-2014   [facility]  . LAPAROSCOPIC OVARIAN CYSTECTOMY Left 01/08/2015   Procedure: LAPAROSCOPIC OVARIAN CYSTECTOMY;  Surgeon: Richardean Chimera, MD;  Location: [facility];  Service: Gynecology;  Laterality: Left;  . LAPAROSCOPY N/A 01/08/2015   Procedure: LAPAROSCOPY DIAGNOSTIC;  Surgeon: Richardean Chimera, MD;  Location[facility];  Service: Gynecology;  Laterality: N/A;  . TONSILLECTOMY  age 45   Social History  Substance Use Topics  . Smoking status: Never Smoker  . Smokeless tobacco: Never Used  . Alcohol use Yes     Comment: occasional   family history includes Alcohol abuse in her mother; Cancer in  her maternal grandfather, maternal grandmother, and paternal grandfather; Diabetes in her maternal grandfather and sister; Hypertension in her maternal grandfather and paternal grandmother.  ROS as above:  Medications: Current Outpatient Prescriptions  Medication Sig Dispense Refill  . amitriptyline (ELAVIL) 25 MG tablet Take 1 tablet (25 mg total) by mouth at bedtime. 30 tablet 3  . diphenhydrAMINE (BENADRYL) 25 MG tablet Take 25 mg by mouth every 6 (six) hours as needed.    . Eluxadoline (VIBERZI) 75 MG TABS Take 75 mg by mouth 2 (two) times daily. 60 tablet 3  . traMADol (ULTRAM) 50 MG tablet Take 1 tablet (50 mg total) by mouth every 8 (eight) hours as needed. 20 tablet 0   No current facility-administered medications for this visit.    Allergies  Allergen Reactions  . Buprenorphine Nausea Only    Health Maintenance Health Maintenance  Topic Date Due  . TETANUS/TDAP  06/21/2009  . INFLUENZA VACCINE  04/26/2017  . PAP SMEAR  09/13/2018  . HIV Screening  Completed     Exam:  BP 119/68 (BP Location: Left Arm, Patient Position: Sitting, Cuff Size: Normal)   Pulse 89   Temp 98.2 F (36.8 C) (Oral)   Wt 140 lb (63.5 kg)   LMP 12/11/2014 (Approximate)   SpO2 100%   BMI 24.03 kg/m  Gen: Well NAD HEENT: EOMI,  MMM Lungs: Normal work of breathing. CTABL Heart: RRR no MRG Abd: Tender along the LLQ/pelvis to the midline back.  Decreased strength with hip flexion and contraction of the abdominal oblique on  the afflicted side.  No CVA tenderness.  Exts: Brisk capillary refill, warm and well perfused.    No results found for this or any previous visit (from the past 72 hour(s)). No results found.    Assessment and Plan: 26 y.o. female with chronic LLQ pain  She seems to be weak along her left oblique and some of her pain could be stemming from the abdominal musculature.  Attend PT to strengthen the abdominal and lower back muscles.    Tramadol for pain until she can  start viberzi for possible gut motility etiology.   Intraabdominal adhesions vs IBS possible.  Referred to chronic pelvic pain clinic.   Imaging vis US and CT abdomen pelvis in 2018 were normal.  I do not think more imaging would be helpful at this time.    Follow up in 1 month, if symptoms do not improve consider working with functional GI clinic.   Orders Placed This Encounter  Procedures  . Ambulatory referral to Physical Therapy    Referral Priority:   Routine    Referral Type:   Physical Medicine    Referral Reason:   Specialty Services Required    Requested Specialty:   Physical Therapy    Number of Visits Requested:   1  . Ambulatory referral to Obstetrics / Gynecology    Referral Priority:   Routine    Referral Type:   Consultation    Referral Reason:   Specialty Services Required    Requested Specialty:   Obstetrics and Gynecology    Number of Visits Requested:   1   Meds ordered this encounter  Medications  . traMADol (ULTRAM) 50 MG tablet    Sig: Take 1 tablet (50 mg total) by mouth every 8 (eight) hours as needed.    Dispense:  20 tablet    Refill:  0     Discussed warning signs or symptoms. Please see discharge instructions. Patient expresses understanding.

## 2017-01-30 NOTE — Progress Notes (Signed)
Pt has been having abd. Pain in LLQ.  Had not been any better.  Medication has not worked.

## 2017-02-02 ENCOUNTER — Telehealth: Payer: Self-pay

## 2017-02-02 NOTE — Telephone Encounter (Signed)
chantix has been denied. Patient must have tried and failed Bupropion

## 2017-02-02 NOTE — Telephone Encounter (Signed)
Pt called and has an appointment with Dr Roxan Hockeyobinson, OB/GYN, next Wednesday at 9 am.  She is requesting something for pain.  She said that you gave her tramadol Monday, but it is not helping.  Please advise.

## 2017-02-02 NOTE — Telephone Encounter (Signed)
I dont have much other medicine to use at this time.

## 2017-02-02 NOTE — Telephone Encounter (Signed)
We will discuss chantix at the next visit.

## 2017-02-03 ENCOUNTER — Telehealth: Payer: Self-pay | Admitting: Family Medicine

## 2017-02-03 NOTE — Telephone Encounter (Signed)
Dr Denyse Amassorey: Pt called. She wants to get on Charley's schedule today for  pain after I told her that you didn't have any other  medicine to prescribe since Tramadol is not working for her. Suellyn recommended that you be made aware of this.

## 2017-02-09 ENCOUNTER — Telehealth: Payer: Self-pay | Admitting: *Deleted

## 2017-02-09 NOTE — Telephone Encounter (Signed)
Pre Authorization sent to cover my meds.[id number]

## 2017-02-14 NOTE — Telephone Encounter (Signed)
[facility] and [place] NancyViberzi has been approved until 08/12/17 or until coverage for the medication is no longer available. I will notify pharmacy. - CF  File ID: [id number]

## 2017-03-02 ENCOUNTER — Ambulatory Visit: Payer: Self-pay | Admitting: Family Medicine

## 2017-03-07 ENCOUNTER — Encounter: Payer: Self-pay | Admitting: Family Medicine

## 2017-03-07 ENCOUNTER — Ambulatory Visit (INDEPENDENT_AMBULATORY_CARE_PROVIDER_SITE_OTHER): Payer: 59 | Admitting: Family Medicine

## 2017-03-07 VITALS — BP 117/66 | HR 84 | Wt 140.0 lb

## 2017-03-07 DIAGNOSIS — R51 Headache: Secondary | ICD-10-CM | POA: Diagnosis not present

## 2017-03-07 DIAGNOSIS — R102 Pelvic and perineal pain: Secondary | ICD-10-CM

## 2017-03-07 DIAGNOSIS — G8929 Other chronic pain: Secondary | ICD-10-CM | POA: Diagnosis not present

## 2017-03-07 DIAGNOSIS — R519 Headache, unspecified: Secondary | ICD-10-CM

## 2017-03-07 MED ORDER — PROMETHAZINE HCL 25 MG PO TABS: 25.0000 mg | ORAL_TABLET | Freq: Three times a day (TID) | ORAL | 3 refills | Status: DC | PRN

## 2017-03-07 MED ORDER — OXYCODONE-ACETAMINOPHEN 10-325 MG PO TABS: ORAL_TABLET | ORAL | 0 refills | Status: DC

## 2017-03-07 NOTE — Patient Instructions (Signed)
Thank you for coming in today. Continue oxycodone up to 3 pills per day.  Try to take less than 3 per day.  Continue miralax daily for constipation.  Recheck in 1 month.  Please bring your pill bottle and your pain journal with you.   I am the only doctor who is allowed to prescribe controlled pain medicine including cough medicine that contains narcotics.   We will continue to follow this.

## 2017-03-07 NOTE — Progress Notes (Signed)
Amanda Vazquez is a 26 y.o. female who presents to [place] [place] today for follow-up chronic pelvic pain and headache disorder.  Patient is seen multiple times for chronic pelvic pain. In the interim she has managed to establish care with [facility] where she is under the care of a chronic pelvic pain specialist. She has resumed: Physical therapy which has not been helpful. She has however resume hormone replacement therapy which has not worsened her pelvic pain as it has previously. The oral replacement therapy has significantly improved her headaches however. She notes chronic pelvic pain that is quite severe at times. She's been to urgent care previously were she's received oxycodone which did help to control her pain significantly. She notes the oxycodone allows her to work.  Additionally patient is here to follow-up her headache disorder. As noted above with resumption of oral replacement therapy her headaches have resolved. She is feeling great from this standpoint.     Past Medical History:  Diagnosis Date  . Common migraine with intractable migraine 12/16/2016  . Endometriosis   . Frequency of urination   . Migraine   . Pelvic pain in female   . Polycystic ovary disease    Past Surgical History:  Procedure Laterality Date  . ABDOMINAL HYSTERECTOMY    . DX LAPAROSCOPY W/ LASER ABLATION OF ENDOMETRIOSIS  06-12-2014   [facility]  . LAPAROSCOPIC OVARIAN CYSTECTOMY Left 01/08/2015   Procedure: LAPAROSCOPIC OVARIAN CYSTECTOMY;  Surgeon: Arvella Nigh, MD;  Location: [place];  Service: Gynecology;  Laterality: Left;  . LAPAROSCOPY N/A 01/08/2015   Procedure: LAPAROSCOPY DIAGNOSTIC;  Surgeon: Arvella Nigh, MD;  Location: [facility];  Service: Gynecology;  Laterality: N/A;  . TONSILLECTOMY  age 21   Social History    Substance Use Topics  . Smoking status: Never Smoker  . Smokeless tobacco: Never Used  . Alcohol use Yes     Comment: occasional   family history includes Alcohol abuse in her mother; Cancer in her maternal grandfather, maternal grandmother, and paternal grandfather; Diabetes in her maternal grandfather and sister; Hypertension in her maternal grandfather and paternal grandmother.  ROS as above:  Medications: Current Outpatient Prescriptions  Medication Sig Dispense Refill  . estradiol (CLIMARA - DOSED IN MG/24 HR) 0.05 mg/24hr patch Place onto the skin.    Marland Kitchen oxyCODONE-acetaminophen (PERCOCET) 10-325 MG tablet Take 1/2 tab to 1 tab every 8 hours as needed. 90 tablet 0  . promethazine (PHENERGAN) 25 MG tablet Take 1 tablet (25 mg total) by mouth every 8 (eight) hours as needed for nausea or vomiting. 30 tablet 3  . NARCAN 4 MG/0.1ML LIQD nasal spray kit USE AS DIRECTED  0   No current facility-administered medications for this visit.    Allergies  Allergen Reactions  . Buprenorphine Nausea Only    Health Maintenance Health Maintenance  Topic Date Due  . TETANUS/TDAP  06/21/2009  . INFLUENZA VACCINE  04/26/2017  . PAP SMEAR  09/13/2018  . HIV Screening  Completed     Exam:  BP 117/66   Pulse 84   Wt 140 lb (63.5 kg)   LMP 12/11/2014 (Approximate)   BMI 24.03 kg/m  Gen: Well NAD HEENT: EOMI,  MMM Lungs: Normal work of breathing. CTABL Heart: RRR no MRG Abd: NABS, Soft. Nondistended, Nontender Exts: Brisk capillary refill, warm and well perfused.    No results found for this or any  previous visit (from the past 72 hour(s)). No results found.    Assessment and Plan: 26 y.o. female with  Chronic pelvic pain: This is a big problem for [name]. I think she should continue to follow along with the chronic pelvic pain specialist as there may be some benefit there. Ultimately she may have another surgery. In the meantime I think it's reasonable date continue her chronic  opiates. We had a long discussion of risks versus benefits of these medications. It looks like a stable dose for her is oxycodone 10 mg 3 pills per day. This was reviewed using [place] controlled substance reporting system. Ultimately I would like to hand her off to a chronic pain specialist as I think she will be better controlled there. In the meantime plan to use oxycodone as a way to allow her to continue to function at work. Medication use agreement signed today.  Additionally headaches seem to be doing much better. Continue to follow along.   Orders Placed This Encounter  Procedures  . Ambulatory referral to Pain Clinic    Referral Priority:   Routine    Referral Type:   Consultation    Referral Reason:   Specialty Services Required    Referred to Provider:   Gillis Santa, MD    Requested Specialty:   Anesthesiology    Number of Visits Requested:   1   Meds ordered this encounter  Medications  . estradiol (CLIMARA - DOSED IN MG/24 HR) 0.05 mg/24hr patch    Sig: Place onto the skin.  Marland Kitchen DISCONTD: oxyCODONE-acetaminophen (PERCOCET) 10-325 MG tablet    Sig: take 1 tablet by mouth every 6 hours if needed for pain  . DISCONTD: promethazine (PHENERGAN) 25 MG tablet  . NARCAN 4 MG/0.1ML LIQD nasal spray kit    Sig: USE AS DIRECTED    Refill:  0  . oxyCODONE-acetaminophen (PERCOCET) 10-325 MG tablet    Sig: Take 1/2 tab to 1 tab every 8 hours as needed.    Dispense:  90 tablet    Refill:  0  . promethazine (PHENERGAN) 25 MG tablet    Sig: Take 1 tablet (25 mg total) by mouth every 8 (eight) hours as needed for nausea or vomiting.    Dispense:  30 tablet    Refill:  3     Discussed warning signs or symptoms. Please see discharge instructions. Patient expresses understanding.  I spent 40 minutes with this patient, greater than 50% was face-to-face time counseling regarding the above diagnosis.

## 2017-03-09 ENCOUNTER — Telehealth: Payer: Self-pay | Admitting: Family Medicine

## 2017-03-09 NOTE — Telephone Encounter (Signed)
Pt called to let PCP know [facility] [address] [facility] because [facility] did not have any in stock. Pt states she signed a contract to use a specific pharmacy and was advised to contact PCP if there were any changes. She did not want PCP to see where she went to a different pharmacy and think she was breaking her contract. Will route.

## 2017-03-10 NOTE — Telephone Encounter (Signed)
Noted. No worries

## 2017-03-14 ENCOUNTER — Ambulatory Visit: Payer: Self-pay | Admitting: Family Medicine

## 2017-03-15 ENCOUNTER — Ambulatory Visit: Payer: Self-pay | Admitting: Family Medicine

## 2017-03-15 DIAGNOSIS — Z0189 Encounter for other specified special examinations: Secondary | ICD-10-CM

## 2017-03-16 ENCOUNTER — Encounter: Payer: Self-pay | Admitting: Family Medicine

## 2017-03-16 ENCOUNTER — Ambulatory Visit (INDEPENDENT_AMBULATORY_CARE_PROVIDER_SITE_OTHER): Payer: 59 | Admitting: Family Medicine

## 2017-03-16 VITALS — BP 117/75 | HR 74 | Temp 98.1°F | Wt 139.0 lb

## 2017-03-16 DIAGNOSIS — R21 Rash and other nonspecific skin eruption: Secondary | ICD-10-CM | POA: Diagnosis not present

## 2017-03-16 MED ORDER — TRIAMCINOLONE ACETONIDE 0.1 % EX CREA: 1.0000 "application " | TOPICAL_CREAM | Freq: Two times a day (BID) | CUTANEOUS | 12 refills | Status: DC

## 2017-03-16 NOTE — Patient Instructions (Signed)
Thank you for coming in today. I do not know 100% what this is yet.  I recommend using over the counter zyrtec daily.  Use benadryl as needed for itching.  Apply Triamcinolone cream daily or twice daily until it fades.  Change razors and do not shave for your legs for 1-2 weeks.   We will do some watchful waiting.   This may be keratosis pilaris.

## 2017-03-16 NOTE — Progress Notes (Signed)
Amanda Vazquez is a 26 y.o. female who presents to [place]: [place] today for rash. Amanda Vazquez is here today with a one-week history of rash on both of her legs. She notes a fine papular mildly erythematous mildly itchy rash. She cannot think of any provocative exposures including new detergent shampoos lotions or cosmetics or medications. She feels well however with no fevers chills or body aches. She has tried some Benadryl which helps a little. Symptoms are mild.   Past Medical History:  Diagnosis Date  . Common migraine with intractable migraine 12/16/2016  . Endometriosis   . Frequency of urination   . Migraine   . Pelvic pain in female   . Polycystic ovary disease    Past Surgical History:  Procedure Laterality Date  . ABDOMINAL HYSTERECTOMY    . DX LAPAROSCOPY W/ LASER ABLATION OF ENDOMETRIOSIS  06-12-2014   [facility]  . LAPAROSCOPIC OVARIAN CYSTECTOMY Left 01/08/2015   Procedure: LAPAROSCOPIC OVARIAN CYSTECTOMY;  Surgeon: Arvella Nigh, MD;  Location: [place];  Service: Gynecology;  Laterality: Left;  . LAPAROSCOPY N/A 01/08/2015   Procedure: LAPAROSCOPY DIAGNOSTIC;  Surgeon: Arvella Nigh, MD;  Location: [facility];  Service: Gynecology;  Laterality: N/A;  . TONSILLECTOMY  age 79   Social History  Substance Use Topics  . Smoking status: Never Smoker  . Smokeless tobacco: Never Used  . Alcohol use Yes     Comment: occasional   family history includes Alcohol abuse in her mother; Cancer in her maternal grandfather, maternal grandmother, and paternal grandfather; Diabetes in her maternal grandfather and sister; Hypertension in her maternal grandfather and paternal grandmother.  ROS as above:  Medications: Current Outpatient Prescriptions  Medication Sig Dispense Refill  . estradiol (CLIMARA - DOSED IN MG/24 HR) 0.05  mg/24hr patch Place onto the skin.    Marland Kitchen NARCAN 4 MG/0.1ML LIQD nasal spray kit USE AS DIRECTED  0  . oxyCODONE-acetaminophen (PERCOCET) 10-325 MG tablet Take 1/2 tab to 1 tab every 8 hours as needed. 90 tablet 0  . promethazine (PHENERGAN) 25 MG tablet Take 1 tablet (25 mg total) by mouth every 8 (eight) hours as needed for nausea or vomiting. 30 tablet 3  . triamcinolone cream (KENALOG) 0.1 % Apply 1 application topically 2 (two) times daily. 453.6 g 12   No current facility-administered medications for this visit.    Allergies  Allergen Reactions  . Buprenorphine Nausea Only    Health Maintenance Health Maintenance  Topic Date Due  . TETANUS/TDAP  06/21/2009  . INFLUENZA VACCINE  04/26/2017  . PAP SMEAR  09/13/2018  . HIV Screening  Completed     Exam:  BP 117/75   Pulse 74   Temp 98.1 F (36.7 C) (Oral)   Wt 139 lb (63 kg)   LMP 12/11/2014 (Approximate)   BMI 23.86 kg/m  Gen: Well NAD Nontoxic appearing HEENT: EOMI,  MMM No mucocutaneous involvement Lungs: Normal work of breathing. CTABL Heart: RRR no MRG Abd: NABS, Soft. Nondistended, Nontender Exts: Brisk capillary refill, warm and well perfused.  Skin: Fine macular mildly erythematous pinpoint 1-2 mm lesions. Some are papular as well. Nontender. Distributed across both lower extremities.   No results found for this or any previous visit (from the past 72 hour(s)). No results found.    Assessment and Plan: 26 y.o. female with rash of unclear origin. The appearance of the rash is most consistent with Keratosis  Pilaris however the distribution is not. Regardless she doesn't seem to have any red flags warning signs or symptoms.  Plan for empiric treatment with triamcinolone cream. Recheck in a few weeks if not better.   No orders of the defined types were placed in this encounter.  Meds ordered this encounter  Medications  . triamcinolone cream (KENALOG) 0.1 %    Sig: Apply 1 application topically 2 (two)  times daily.    Dispense:  453.6 g    Refill:  12     Discussed warning signs or symptoms. Please see discharge instructions. Patient expresses understanding.

## 2017-03-17 ENCOUNTER — Telehealth: Payer: Self-pay | Admitting: Family Medicine

## 2017-03-17 MED ORDER — PROMETHAZINE HCL 25 MG PO TABS: 25.0000 mg | ORAL_TABLET | Freq: Three times a day (TID) | ORAL | 3 refills | Status: DC | PRN
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
## 2017-03-17 NOTE — Telephone Encounter (Signed)
Pt called and wants to know if Dr. Denyse Amassorey can call her back because she has some questions about the pain medication that was prescribed to her. She said she would like to speak with him and not a nurse. Thanks

## 2017-03-21 ENCOUNTER — Ambulatory Visit (INDEPENDENT_AMBULATORY_CARE_PROVIDER_SITE_OTHER): Payer: 59 | Admitting: Family Medicine

## 2017-03-21 ENCOUNTER — Encounter: Payer: Self-pay | Admitting: Family Medicine

## 2017-03-21 VITALS — BP 102/56 | HR 61 | Ht 64.0 in | Wt 139.0 lb

## 2017-03-21 DIAGNOSIS — G8929 Other chronic pain: Secondary | ICD-10-CM | POA: Diagnosis not present

## 2017-03-21 DIAGNOSIS — R109 Unspecified abdominal pain: Secondary | ICD-10-CM

## 2017-03-21 MED ORDER — CLONIDINE HCL 0.1 MG PO TABS: 0.1000 mg | ORAL_TABLET | Freq: Two times a day (BID) | ORAL | 1 refills | Status: DC

## 2017-03-21 MED ORDER — OXYCODONE-ACETAMINOPHEN 10-325 MG PO TABS: ORAL_TABLET | ORAL | 0 refills | Status: DC

## 2017-03-21 NOTE — Patient Instructions (Signed)
Thank you for coming in today. Recheck in 1-2 months or sooner if needed.  Take clonodine twice daily as needed for opiate withdrawal symptoms.  Recheck as needed.
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
## 2017-03-22 ENCOUNTER — Ambulatory Visit: Payer: 59 | Admitting: Adult Health

## 2017-03-30 ENCOUNTER — Ambulatory Visit: Payer: Self-pay | Admitting: Family Medicine

## 2017-03-31 ENCOUNTER — Ambulatory Visit: Payer: Self-pay | Admitting: Family Medicine

## 2017-04-03 ENCOUNTER — Telehealth: Payer: Self-pay | Admitting: *Deleted

## 2017-04-03 NOTE — Telephone Encounter (Signed)
Patient called and states she was told that her oxycode rx could be filled early and she was at the pharmacy wanting someone to call her back. I see in the notes that we were not refilling this medication until 04/06/2017. Called pharmacy and let them know

## 2017-05-04 ENCOUNTER — Ambulatory Visit: Payer: Self-pay | Admitting: Family Medicine

## 2017-05-05 ENCOUNTER — Ambulatory Visit (INDEPENDENT_AMBULATORY_CARE_PROVIDER_SITE_OTHER): Payer: 59 | Admitting: Family Medicine

## 2017-05-05 ENCOUNTER — Encounter: Payer: Self-pay | Admitting: Family Medicine

## 2017-05-05 VITALS — BP 113/68 | HR 61 | Wt 147.0 lb

## 2017-05-05 DIAGNOSIS — R102 Pelvic and perineal pain: Secondary | ICD-10-CM | POA: Diagnosis not present

## 2017-05-05 DIAGNOSIS — G8929 Other chronic pain: Secondary | ICD-10-CM | POA: Diagnosis not present

## 2017-05-05 MED ORDER — OXYCODONE-ACETAMINOPHEN 10-325 MG PO TABS: ORAL_TABLET | ORAL | 0 refills | Status: DC

## 2017-05-05 NOTE — Patient Instructions (Addendum)
Thank you for coming in today. We will continue to follow about pain.  See me about 2 weeks after surgery  Return sooner if you are worse.   Call or go to the emergency room if you get worse, have trouble breathing, have chest pains, or palpitations.

## 2017-05-05 NOTE — Progress Notes (Signed)
Note faxed -EH/RMA  

## 2017-05-05 NOTE — Progress Notes (Signed)
Charlottie Peragine is a 26 y.o. female who presents to [place]: [place] today for follow up pelvic pain.   Brendi has done reasonably well in the interim. She's been following up with her OB/GYN provider at [facility]. She has surgery scheduled for the 17th she'll have a diagnostic laparoscopic lysis of adhesions and cystoscopy. She's not quite sure who will continue pain management during the perioperative period. She notes that oxycodone has been helpful. She is hopeful that after surgery she can taper off of oxycodone if possible. She feels well overall however.   Past Medical History:  Diagnosis Date  . Common migraine with intractable migraine 12/16/2016  . Endometriosis   . Frequency of urination   . Migraine   . Pelvic pain in female   . Polycystic ovary disease    Past Surgical History:  Procedure Laterality Date  . ABDOMINAL HYSTERECTOMY    . DX LAPAROSCOPY W/ LASER ABLATION OF ENDOMETRIOSIS  06-12-2014   [facility]  . LAPAROSCOPIC OVARIAN CYSTECTOMY Left 01/08/2015   Procedure: LAPAROSCOPIC OVARIAN CYSTECTOMY;  Surgeon: Arvella Nigh, MD;  Location: [place];  Service: Gynecology;  Laterality: Left;  . LAPAROSCOPY N/A 01/08/2015   Procedure: LAPAROSCOPY DIAGNOSTIC;  Surgeon: Arvella Nigh, MD;  Location: [facility];  Service: Gynecology;  Laterality: N/A;  . TONSILLECTOMY  age 58   Social History  Substance Use Topics  . Smoking status: Never Smoker  . Smokeless tobacco: Never Used  . Alcohol use Yes     Comment: occasional   family history includes Alcohol abuse in her mother; Cancer in her maternal grandfather, maternal grandmother, and paternal grandfather; Diabetes in her maternal grandfather and sister; Hypertension in her maternal grandfather and paternal grandmother.  ROS as  above:  Medications: Current Outpatient Prescriptions  Medication Sig Dispense Refill  . cloNIDine (CATAPRES) 0.1 MG tablet Take 1 tablet (0.1 mg total) by mouth 2 (two) times daily. 60 tablet 1  . diazepam (VALIUM) 5 MG tablet Place 1 tablet vaginally at bedtime as needed.  0  . estradiol (CLIMARA - DOSED IN MG/24 HR) 0.05 mg/24hr patch Place onto the skin.    Marland Kitchen NARCAN 4 MG/0.1ML LIQD nasal spray kit USE AS DIRECTED  0  . oxyCODONE-acetaminophen (PERCOCET) 10-325 MG tablet Take 1/2 tab to 1 tab every 8 hours as needed. 90 tablet 0  . promethazine (PHENERGAN) 25 MG tablet Take 1 tablet (25 mg total) by mouth every 8 (eight) hours as needed for nausea or vomiting. 30 tablet 3  . triamcinolone cream (KENALOG) 0.1 % Apply 1 application topically 2 (two) times daily. 453.6 g 12   No current facility-administered medications for this visit.    Allergies  Allergen Reactions  . Buprenorphine Nausea Only    Health Maintenance Health Maintenance  Topic Date Due  . TETANUS/TDAP  06/21/2009  . INFLUENZA VACCINE  04/26/2017  . PAP SMEAR  09/13/2018  . HIV Screening  Completed     Exam:  BP 113/68   Pulse 61   Wt 147 lb (66.7 kg)   LMP 12/11/2014 (Approximate)   BMI 25.23 kg/m  Gen: Well NAD HEENT: EOMI,  MMM Lungs: Normal work of breathing. CTABL Heart: RRR no MRG Abd: NABS, Soft. Nondistended, Nontender Exts: Brisk capillary refill, warm and well perfused.    No results found for this or any previous visit (from the past 72 hour(s)). No results found.  Assessment and Plan: 26 y.o. female with Chronic pelvic pain. Agree with surgical plan. Plan to prescribe oxycodone today.  I am happy to prescribe pain medication during the perioperative period. Patient should follow up with me a few weeks after surgery to discuss pain control. I will send a copy of this note to OB/GYN.   No orders of the defined types were placed in this encounter.  Meds ordered this encounter   Medications  . diazepam (VALIUM) 5 MG tablet    Sig: Place 1 tablet vaginally at bedtime as needed.    Refill:  0  . oxyCODONE-acetaminophen (PERCOCET) 10-325 MG tablet    Sig: Take 1/2 tab to 1 tab every 8 hours as needed.    Dispense:  90 tablet    Refill:  0    Fill on 05/07/17     Discussed warning signs or symptoms. Please see discharge instructions. Patient expresses understanding.  CC: Jorge Ny, [address] [place] [place], [place] [address]  [phone]  [phone] (Fax)    Patient researched [facility] Controlled Substance Reporting System.

## 2017-05-15 ENCOUNTER — Telehealth: Payer: Self-pay

## 2017-05-15 NOTE — Telephone Encounter (Signed)
PT states [facility] needs approval for the fast acting pain medication that Dr. Roxan Hockey prescribed. She's been taking Percocet due to not being able to get the medicine from [facility] without Dr. Zollie Pee approval. Please call pt to advise if there are any issues.

## 2017-05-15 NOTE — Telephone Encounter (Signed)
Amanda Vazquez states the pharmacy is faxing a form to sign to allow her to fill the medications from the GYN surgeon.

## 2017-05-17 ENCOUNTER — Encounter: Payer: Self-pay | Admitting: Family Medicine

## 2017-05-17 ENCOUNTER — Ambulatory Visit (INDEPENDENT_AMBULATORY_CARE_PROVIDER_SITE_OTHER): Payer: 59 | Admitting: Family Medicine

## 2017-05-17 VITALS — BP 112/74 | HR 76 | Wt 143.0 lb

## 2017-05-17 DIAGNOSIS — G894 Chronic pain syndrome: Secondary | ICD-10-CM | POA: Diagnosis not present

## 2017-05-17 DIAGNOSIS — R1084 Generalized abdominal pain: Secondary | ICD-10-CM

## 2017-05-17 MED ORDER — OXYCODONE-ACETAMINOPHEN 10-325 MG PO TABS: ORAL_TABLET | ORAL | 0 refills | Status: DC

## 2017-05-17 NOTE — Patient Instructions (Signed)
Thank you for coming in today. Recheck next Friday as already scheduled.  Take Oxycodone for pain.  Take 1 of the 10/325mg  pill every 6 hours as needed for pain.  Take ibuprofen up to 800mg  every 8 hours.  Do not take more than this.   Recheck sooner if needed.   Continue medicine for constipation.

## 2017-05-17 NOTE — Progress Notes (Signed)
Amanda Vazquez is a 26 y.o. female who presents to [facility] today for abdominal pain. Patient had laparoscopic lysis of adhesions on the 17th for chronic abdominal pain. Since that her pain has not been well controlled. She's been using her chronic pain medications more than she normally would have then was prescribed 15 tablets of oxycodone 5 by her OB/GYN. She is nearly run out of pain medication and is not doing well. She notes that she is able to have a normal bowel movement daily and denies any vomiting. She is able to eat and drink denies any fevers or chills. She notes the pain is quite severe at times and is worse with motion.   Past Medical History:  Diagnosis Date  . Common migraine with intractable migraine 12/16/2016  . Endometriosis   . Frequency of urination   . Migraine   . Pelvic pain in female   . Polycystic ovary disease    Past Surgical History:  Procedure Laterality Date  . ABDOMINAL HYSTERECTOMY    . DX LAPAROSCOPY W/ LASER ABLATION OF ENDOMETRIOSIS  06-12-2014   [facility]  . LAPAROSCOPIC OVARIAN CYSTECTOMY Left 01/08/2015   Procedure: LAPAROSCOPIC OVARIAN CYSTECTOMY;  Surgeon: Richardean Chimera, MD;  Location: [facility];  Service: Gynecology;  Laterality: Left;  . LAPAROSCOPY N/A 01/08/2015   Procedure: LAPAROSCOPY DIAGNOSTIC;  Surgeon: Richardean Chimera, MD;  Location: [facility];  Service: Gynecology;  Laterality: N/A;  . TONSILLECTOMY  age 30   Social History  Substance Use Topics  . Smoking status: Never Smoker  . Smokeless tobacco: Never Used  . Alcohol use Yes     Comment: occasional   family history includes Alcohol abuse in her mother; Cancer in her maternal grandfather, maternal grandmother, and paternal grandfather; Diabetes in her maternal grandfather and sister; Hypertension in her maternal  grandfather and paternal grandmother.  ROS as above:  Medications: Current Outpatient Prescriptions  Medication Sig Dispense Refill  . cloNIDine (CATAPRES) 0.1 MG tablet Take 1 tablet (0.1 mg total) by mouth 2 (two) times daily. 60 tablet 1  . estradiol (CLIMARA - DOSED IN MG/24 HR) 0.05 mg/24hr patch Place onto the skin.    Marland Kitchen oxyCODONE-acetaminophen (PERCOCET) 10-325 MG tablet Take 1 tab 4x daily for acute pain on top of chronic pain following surgery. 60 tablet 0  . triamcinolone cream (KENALOG) 0.1 % Apply 1 application topically 2 (two) times daily. 453.6 g 12   No current facility-administered medications for this visit.    Allergies  Allergen Reactions  . Buprenorphine Nausea Only    Health Maintenance Health Maintenance  Topic Date Due  . TETANUS/TDAP  06/21/2009  . INFLUENZA VACCINE  05/17/2018 (Originally 04/26/2017)  . PAP SMEAR  09/13/2018  . HIV Screening  Completed     Exam:  BP 112/74   Pulse 76   Wt 143 lb (64.9 kg)   LMP 12/11/2014 (Approximate)   BMI 24.55 kg/m  Gen: Well NAD HEENT: EOMI,  MMM Lungs: Normal work of breathing. CTABL Heart: RRR no MRG Abd: NABS, Soft. Nondistended, Well-appearing surgical scars. Tender palpation no masses palpated. Exts: Brisk capillary refill, warm and well perfused.    No results found for this or any previous visit (from the past 72 hour(s)). No results found.    Assessment and Plan: 26 y.o. female with abdominal pain acute on chronic due to surgery. Plan to slightly increase total pain medication  to 40 mg of oxycodone per day. Will recheck in 8 days pain regimen and control. Additionally recommend ibuprofen. I will be happy to take over pain management from OB/GYN. Will Route this note to OB/GYN.   No orders of the defined types were placed in this encounter.  Meds ordered this encounter  Medications  . oxyCODONE-acetaminophen (PERCOCET) 10-325 MG tablet    Sig: Take 1 tab 4x daily for acute pain on top of  chronic pain following surgery.    Dispense:  60 tablet    Refill:  0    Patient researched [place] Controlled Substance Reporting System.   Discussed warning signs or symptoms. Please see discharge instructions. Patient expresses understanding.  I spent 25 minutes with this patient, greater than 50% was face-to-face time counseling regarding pain medication usage safety and next steps..  CC:  Lars Mage, MD  [facility] [address]  [place], [place] [address]  [phone]  [phone] (Fax)

## 2017-05-25 ENCOUNTER — Telehealth: Payer: Self-pay | Admitting: *Deleted

## 2017-05-25 NOTE — Telephone Encounter (Signed)
Checked vm at 04:46. Message recieved at 2:46 on vm from Dr. Marlin CanaryErica Robinson from [facility]. Wants to speak with Dr. Denyse Amassorey in regard this patient [phone]

## 2017-05-26 ENCOUNTER — Encounter: Payer: Self-pay | Admitting: Family Medicine

## 2017-05-26 ENCOUNTER — Ambulatory Visit (INDEPENDENT_AMBULATORY_CARE_PROVIDER_SITE_OTHER): Payer: 59 | Admitting: Family Medicine

## 2017-05-26 VITALS — BP 94/59 | HR 72 | Wt 138.0 lb

## 2017-05-26 DIAGNOSIS — F1120 Opioid dependence, uncomplicated: Secondary | ICD-10-CM | POA: Diagnosis not present

## 2017-05-26 DIAGNOSIS — G8929 Other chronic pain: Secondary | ICD-10-CM

## 2017-05-26 DIAGNOSIS — G894 Chronic pain syndrome: Secondary | ICD-10-CM

## 2017-05-26 DIAGNOSIS — R102 Pelvic and perineal pain: Secondary | ICD-10-CM | POA: Diagnosis not present

## 2017-05-26 MED ORDER — DIAZEPAM 5 MG PO TABS: ORAL_TABLET | ORAL | 0 refills | Status: DC

## 2017-05-26 NOTE — Patient Instructions (Signed)
Thank you for coming in today. I think it is a great idea to follow up with Addiction Medicine.  Let me know if you run into any road blocks.  Suboxone is a good medicine for this.  Try take 1/4 strip for 2 days then decrease to 1/8 strip for 2 days then stop.    Opioid Use Disorder Opioids are powerful substances that relieve pain. Opioids include illegal drugs, such as heroin, as well as prescription pain medicines, such as codeine, morphine, hydrocodone, oxycodone, and fentanyl. Opioid use disorder is when you take opioids for nonmedical reasons even though taking them hurts your health and well-being. Taking prescribed opioids regularly can lead to dependence, especially if you take them in larger amounts or more often than they should be taken. Opioid use disorder often disrupts life at home, work, or school. It can cause mental and physical problems. It also increases your risk of suicide and death from overdose. What are the causes? This condition is caused by taking opioids. Taking opioids repeatedly results in changes in the brain that make it hard to control opioid use. Many people develop this condition because they like the way they feel when they take opioids or because they get addicted to them. What increases the risk? This condition is more likely to develop in:  People with a family history of opioid use disorder.  People who misuse other drugs.  People with a mental illness, such as depression, post-traumatic stress disorder, or antisocial personality disorder.  People who begin use at an early age, such as during their teen years.  What are the signs or symptoms? Symptoms of this condition include:  Taking greater amounts of an opioid than you want to or for longer than you want to.  Trying several times to control your opioid use.  Spending a lot of time getting opioids, using them, or recovering from their effects.  Craving opioids.  Having problems at work, at  school, at home, or in a relationship because of opioid use.  Giving up or cutting down on important life activities because of opioid use.  Using opioids when it is dangerous, such as when driving a car.  Continuing to use an opioid even though it has led to a physical problem, such as: ? Severe constipation. ? Poor nutrition. ? Infertility. ? Tuberculosis. ? Aspiration pneumonia. ? An infection, such as hepatitis or HIV (human immunodeficiency virus).  Continuing to use an opioid even though it is causing a mental problem, such as: ? Depression or anxiety. ? Hallucinations. ? Sleep problems. ? Loss of interest in sex.  Needing more and more of an opioid to get the same effect that you want from the opioid (building up a tolerance).  Having symptoms of withdrawal when you stop using an opioid. Some symptoms of withdrawal are: ? Depression or anxiety. ? Irritability. ? Nausea or vomiting. ? Muscle aches or spasms. ? Watery eyes. ? Trouble sleeping. ? Yawning.  How is this diagnosed? This condition is diagnosed with an assessment. During the assessment, your health care provider will ask about your opioid use and how it affects your life. Your health care provider may perform a physical exam or do lab tests to see if you have physical problems resulting from opioid use. Your health care provider may also screen for drug use and refer you to a mental health professional for evaluation. How is this treated? Treatment for this condition is usually provided by mental health professionals with  training in substance use disorders. The first step in treatment is detoxification, which involves taking medicines to lessen withdrawal symptoms. Additional treatment may involve:  Counseling. This treatment is also called talk therapy. It is provided by substance use treatment counselors. A counselor can address the reasons you use opioids and suggest ways to keep you from using opioids again.  The goals of talk therapy are to: ? Find healthy activities and ways to cope with stress. ? Identify and avoid what triggers your opioid use. ? Help you learn how to handle cravings.  Support groups. Support groups are run by people who have quit using opioids. They provide emotional support, advice, and guidance.  A medicine that blocks opioid receptors in your brain. This medicine can reduce opioid cravings that lead to relapse. This medicine also blocks the good feeling that you get from using opioids.  Opioid maintenance treatment. This involves taking certain kinds of opioid medicines. These medicines satisfy cravings but are safer than commonly misused opioids. This is often the best option for people who continue to relapse with other treatments.  Follow these instructions at home:  Take over-the-counter and prescription medicines only as told by your health care provider.  Check with your health care provider before starting any new medicines.  Keep all follow-up visits as told by your health care provider. This is important. Where to find more information:  [employer] Drug Abuse: http://www.price-smith.com/www.drugabuse.gov  Substance Abuse and Mental Health Services Administration: SkateOasis.com.ptwww.samhsa.gov Contact a health care provider if:  You are not able to take your medicines as told.  Your symptoms get worse. Get help right away if:  You may have taken too much of an opioid (overdosed).  You have serious thoughts about hurting yourself or others. If you ever feel like you may hurt yourself or others, or have thoughts about taking your own life, get help right away. You can go to your nearest emergency department or call:  Your local emergency services (911 in the [place]  A suicide crisis helpline, such as the National Suicide Prevention Lifeline at (325)704-03041-914-036-0122. This is open 24 hours a day.  This information is not intended to replace advice given to you by your health care provider.  Make sure you discuss any questions you have with your health care provider. Document Released: 07/10/2007 Document Revised: 06/24/2016 Document Reviewed: 06/24/2016 Elsevier Interactive Patient Education  [employer]2018 Elsevier Inc.

## 2017-05-26 NOTE — Progress Notes (Signed)
Amanda Vazquez is a 26 y.o. female who presents to [facility]: Primary Care Sports Medicine today for follow-up of abdominal pain and discuss opiate addiction.  Patient recently had laparoscopic abdominal surgery for chronic pelvic and abdominal pain. She's feeling much better and has significantly reduced pain. She continues to use vaginal Valium for muscle spasm prior to her pelvic physical therapy.  However she notes that she thinks that she is addicted to opiates. She's been on chronic opiates now for some time in relation to her abdominal pain. She does not want to take opiates any longer. Her mother also struggles with addiction and has used Suboxone in the past. She is to use one of her mother's Suboxone which has significantly helped. She notes that she thinks that she can get into an addiction specialist soon after doing some: Around. She would like a referral. She does not want to take opiates in the future if possible.    Past Medical History:  Diagnosis Date  . Common migraine with intractable migraine 12/16/2016  . Endometriosis   . Frequency of urination   . Migraine   . Pelvic pain in female   . Polycystic ovary disease    Past Surgical History:  Procedure Laterality Date  . ABDOMINAL HYSTERECTOMY    . DX LAPAROSCOPY W/ LASER ABLATION OF ENDOMETRIOSIS  06-12-2014   [facility]  . LAPAROSCOPIC OVARIAN CYSTECTOMY Left 01/08/2015   Procedure: LAPAROSCOPIC OVARIAN CYSTECTOMY;  Surgeon: Richardean Chimera, MD;  Location: [facility];  Service: Gynecology;  Laterality: Left;  . LAPAROSCOPY N/A 01/08/2015   Procedure: LAPAROSCOPY DIAGNOSTIC;  Surgeon: Richardean Chimera, MD;  Location: [facility];  Service: Gynecology;  Laterality: N/A;  . TONSILLECTOMY  age 22   Social History  Substance Use Topics  . Smoking status: Never Smoker  . Smokeless tobacco:  Never Used  . Alcohol use Yes     Comment: occasional   family history includes Alcohol abuse in her mother; Cancer in her maternal grandfather, maternal grandmother, and paternal grandfather; Diabetes in her maternal grandfather and sister; Hypertension in her maternal grandfather and paternal grandmother.  ROS as above:  Medications: Current Outpatient Prescriptions  Medication Sig Dispense Refill  . estradiol (CLIMARA - DOSED IN MG/24 HR) 0.05 mg/24hr patch Place onto the skin.    . diazepam (VALIUM) 5 MG tablet Place 1 tablet in vagina the night prior to PT 15 tablet 0   No current facility-administered medications for this visit.    Allergies  Allergen Reactions  . Buprenorphine Nausea Only    Health Maintenance Health Maintenance  Topic Date Due  . INFLUENZA VACCINE  05/17/2018 (Originally 04/26/2017)  . TETANUS/TDAP  05/26/2018 (Originally 06/21/2009)  . PAP SMEAR  09/13/2018  . HIV Screening  Completed     Exam:  BP (!) 94/59   Pulse 72   Wt 138 lb (62.6 kg)   LMP 12/11/2014 (Approximate)   SpO2 97%   BMI 23.69 kg/m  Gen: Well NAD HEENT: EOMI,  MMM Lungs: Normal work of breathing. CTABL Heart: RRR no MRG Abd: NABS, Soft. Nondistended, Mildly tender  Exts: Brisk capillary refill, warm and well perfused.   Patient researched [facility] Controlled Substance Reporting System. No other prescribers aside from myself and OB/GYN recently.  No results found for this or any previous visit (from the past 72 hour(s)). No results found.    Assessment and Plan: 26 y.o. female with  Opiate addiction: I am very happy that Amanda Vazquez came to clinic today to discuss this issue. This shows fantastic inside. I agree wholeheartedly with referral to addiction medicine. I think she probably would benefit from Suboxone but ultimately I hope that she can do without this medicine at all.  Plan to recheck in about 2 weeks.  Pelvic pain and spasm. Amanda Vazquez continue pelvic  physical therapy. If it is reasonable to continue vaginal diazepam. I'm not concerned about addiction with this compared to opiates. Recheck in 2 weeks.   Orders Placed This Encounter  Procedures  . Ambulatory referral to Chemical Dependency    Referral Priority:   Routine    Referral Type:   Psychiatric    Referral Reason:   Specialty Services Required    Requested Specialty:   Addiction Medicine    Number of Visits Requested:   1   Meds ordered this encounter  Medications  . diazepam (VALIUM) 5 MG tablet    Sig: Place 1 tablet in vagina the night prior to PT    Dispense:  15 tablet    Refill:  0     Discussed warning signs or symptoms. Please see discharge instructions. Patient expresses understanding.  I spent 40 minutes with this patient, greater than 50% was face-to-face time counseling regarding nature of opiate addiction and plan.

## 2017-06-07 ENCOUNTER — Ambulatory Visit (INDEPENDENT_AMBULATORY_CARE_PROVIDER_SITE_OTHER): Payer: Self-pay | Admitting: Family Medicine

## 2017-06-07 VITALS — BP 97/67 | HR 71 | Wt 140.0 lb

## 2017-06-07 DIAGNOSIS — F331 Major depressive disorder, recurrent, moderate: Secondary | ICD-10-CM

## 2017-06-07 DIAGNOSIS — F411 Generalized anxiety disorder: Secondary | ICD-10-CM

## 2017-06-07 DIAGNOSIS — F1120 Opioid dependence, uncomplicated: Secondary | ICD-10-CM

## 2017-06-07 DIAGNOSIS — F321 Major depressive disorder, single episode, moderate: Secondary | ICD-10-CM

## 2017-06-07 MED ORDER — DULOXETINE HCL 30 MG PO CPEP: 30.0000 mg | ORAL_CAPSULE | Freq: Every day | ORAL | 0 refills | Status: DC

## 2017-06-07 NOTE — Progress Notes (Signed)
Amanda Vazquez is a 26 y.o. female who [facility] today for follow-up narcotic dependency as well as anxiety and depression symptoms.  Narcotics: Patient was seen August 31 for new diagnosis of opiate dependency. She was asked for referral to some speed use however the physician she requests it is not not working and very expensive. She would like a repeat referral if possible. She continues to lose very low-dose Suboxone but overall is doing quite well. She is interested in counseling or therapy if possible.  Anxiety/depression: Patient notes worsening anxiety/depression symptoms. She's had treatment with SSRIs and SSRIs in the past. She tolerated Cymbalta but was not effective to control her pelvic pain which is why it was initially prescribed. She is interested in starting this medication again for anxiety depression. She denies any active SI or HI.   Past Medical History:  Diagnosis Date  . Common migraine with intractable migraine 12/16/2016  . Endometriosis   . Frequency of urination   . Migraine   . Pelvic pain in female   . Polycystic ovary disease    Past Surgical History:  Procedure Laterality Date  . ABDOMINAL HYSTERECTOMY    . DX LAPAROSCOPY W/ LASER ABLATION OF ENDOMETRIOSIS  06-12-2014   [facility]  . LAPAROSCOPIC OVARIAN CYSTECTOMY Left 01/08/2015   Procedure: LAPAROSCOPIC OVARIAN CYSTECTOMY;  Surgeon: Richardean ChimeraJohn McComb, MD;  [facility];  Service: Gynecology;  Laterality: Left;  . LAPAROSCOPY N/A 01/08/2015   Procedure: LAPAROSCOPY DIAGNOSTIC;  Surgeon: Richardean ChimeraJohn McComb, MD;  [facility] [place];  Service: Gynecology;  Laterality: N/A;  . TONSILLECTOMY  age 26   Social History  Substance Use Topics  . Smoking status: Never Smoker  . Smokeless tobacco: Never Used  . Alcohol use Yes   Comment: occasional   family history includes Alcohol abuse in her mother; Cancer in her maternal grandfather, maternal grandmother, and paternal grandfather; Diabetes in her maternal grandfather and sister; Hypertension in her maternal grandfather and paternal grandmother.  ROS as above:  Medications: Current Outpatient Prescriptions  Medication Sig Dispense Refill  . diazepam (VALIUM) 5 MG tablet Place 1 tablet in vagina the night prior to PT 15 tablet 0  . estradiol (CLIMARA - DOSED IN MG/24 HR) 0.05 mg/24hr patch Place onto the skin.    . DULoxetine (CYMBALTA) 30 MG capsule Take 1 capsule (30 mg total) by mouth daily. 90 capsule 0   No current facility-administered medications for this visit.    Allergies  Allergen Reactions  . Buprenorphine Nausea Only    Health Maintenance Health Maintenance  Topic Date Due  . INFLUENZA VACCINE  05/17/2018 (Originally 04/26/2017)  . TETANUS/TDAP  05/26/2018 (Originally 06/21/2009)  . PAP SMEAR  09/13/2018  . HIV Screening  Completed     Exam:  BP 97/67   Pulse 71   Wt 140 lb (63.5 kg)   LMP 12/11/2014 (Approximate)   BMI 24.03 kg/m  Gen: Well NAD  Psych: Alert and oriented normal speech thought process and affect.  Depression screen [facility] 2/9 06/07/2017 12/22/2016 11/14/2016  Decreased Interest 2 3 2   Down, Depressed, Hopeless 2 3 3   PHQ - 2 Score 4 6 5   Altered sleeping 2 3 3   Tired, decreased energy 3 3 3   Change in appetite 3 2 3   Feeling bad or failure about yourself  1 2 2   Trouble concentrating 2 3 1   Moving slowly or  fidgety/restless 3 0 0  Suicidal thoughts 0 0 0  PHQ-9 Score GAD 7 : Generalized Anxiety Score 06/07/2017 12/22/2016 11/14/2016  Nervous, Anxious, on Edge Control/stop worrying Worry too much - different things Trouble relaxing Restless Easily annoyed or irritable Afraid - awful might happen Total GAD 7 Score Anxiety Difficulty -  Extremely difficult Extremely difficult       No results found for this or any previous visit (from the past 72 hour(s)). No results found.    Assessment and Plan: 26 y.o. female with anxiety and depression: Not well controlled. Will restart Cymbalta and recheck in 2 weeks. Consider counseling/therapy as well.  Opiate dependency: Referral placed for substance abuse treatment is original referral was not in network.   Orders Placed This Encounter  Procedures  . Ambulatory referral to Chemical Dependency    Referral Priority:   Routine    Referral Type:   Psychiatric    Referral Reason:   Specialty Services Required    Requested Specialty:   Addiction Medicine    Number of Visits Requested:   1   Meds ordered this encounter  Medications  . DULoxetine (CYMBALTA) 30 MG capsule    Sig: Take 1 capsule (30 mg total) by mouth daily.    Dispense:  90 capsule    Refill:  0     Discussed warning signs or symptoms. Please see discharge instructions. Patient expresses understanding.  I spent 25 minutes with this patient, greater than 50% was face-to-face time counseling regarding treatment options and plan.

## 2017-06-07 NOTE — Patient Instructions (Signed)
Thank you for coming in today. Take Cymbalta daily.  You should hear about addiction medicine.   Recheck in 2 weeks or sooner if needed.   Consider Narcotic Anonymous.

## 2017-06-21 ENCOUNTER — Ambulatory Visit (INDEPENDENT_AMBULATORY_CARE_PROVIDER_SITE_OTHER): Payer: 59 | Admitting: Family Medicine

## 2017-06-21 ENCOUNTER — Encounter: Payer: Self-pay | Admitting: Family Medicine

## 2017-06-21 VITALS — BP 99/65 | HR 67

## 2017-06-21 DIAGNOSIS — F331 Major depressive disorder, recurrent, moderate: Secondary | ICD-10-CM | POA: Diagnosis not present

## 2017-06-21 DIAGNOSIS — F419 Anxiety disorder, unspecified: Secondary | ICD-10-CM | POA: Diagnosis not present

## 2017-06-21 DIAGNOSIS — F1120 Opioid dependence, uncomplicated: Secondary | ICD-10-CM

## 2017-06-21 MED ORDER — CLONIDINE HCL 0.2 MG PO TABS: 0.2000 mg | ORAL_TABLET | Freq: Every day | ORAL | 2 refills | Status: DC

## 2017-06-21 MED ORDER — DULOXETINE HCL 60 MG PO CPEP: 60.0000 mg | ORAL_CAPSULE | Freq: Every day | ORAL | 3 refills | Status: DC

## 2017-06-21 NOTE — Patient Instructions (Addendum)
Thank you for coming in today. You should hear about Addiction Medicine soon.  Let me know if you do not hear anything within 1 week.  You can also search on your own and suggest clinics.  I am always happy to send a referral like that.   Restart clonidine 0.2mg  at bedtime.   After a few days increase cymbalta to  daily.   Recheck with me in 1 month.  Return sooner if needed.    If you would like to continue therapy you can use the [facility] and see someone besides Rolly Salter if you like.

## 2017-06-21 NOTE — Progress Notes (Signed)
Amanda Vazquez is a 26 y.o. female who presents to [facility]: Primary Care [facility] Medicine today for anxiety depression and opioid dependency.  Opioid dependency: Patient is missing several times for opioid dependency. We are attempting to get her in with addiction medicine however she has not heard from the most recent referral.  She notes in the past she has done well with clonidine and would like a refill of the clonidine prescription that she took several months ago. She did best with 0.2 mg at bedtime to help control her symptoms. She would like to continue that if possible.  Anxiety and depression: Patient is currently taking Cymbalta 30 mg. She notes this time of year is especially difficult for her because of some family deaths that occurred in September in the past. She is a little worse than usual. Additionally she notes the stress of her difficulty with opioids is also causing her mood to be a bit worse. Overall she thinks is doing well and denies any SI or HI.    Past Medical History:  Diagnosis Date  . Common migraine with intractable migraine 12/16/2016  . Endometriosis   . Frequency of urination   . Migraine   . Pelvic pain in female   . Polycystic ovary disease    Past Surgical History:  Procedure Laterality Date  . ABDOMINAL HYSTERECTOMY    . DX LAPAROSCOPY W/ LASER ABLATION OF ENDOMETRIOSIS  06-12-2014   [facility]  . LAPAROSCOPIC OVARIAN CYSTECTOMY Left 01/08/2015   Procedure: LAPAROSCOPIC OVARIAN CYSTECTOMY;  Surgeon: Richardean Chimera, MD;  Location: [facility];  Service: Gynecology;  Laterality: Left;  . LAPAROSCOPY N/A 01/08/2015   Procedure: LAPAROSCOPY DIAGNOSTIC;  Surgeon: Richardean Chimera, MD;  Location[facility];  Service: Gynecology;  Laterality: N/A;  . TONSILLECTOMY  age 83   Social History  Substance Use Topics  . Smoking  status: Never Smoker  . Smokeless tobacco: Never Used  . Alcohol use Yes     Comment: occasional   family history includes Alcohol abuse in her mother; Cancer in her maternal grandfather, maternal grandmother, and paternal grandfather; Diabetes in her maternal grandfather and sister; Hypertension in her maternal grandfather and paternal grandmother.  ROS as above:  Medications: Current Outpatient Prescriptions  Medication Sig Dispense Refill  . diazepam (VALIUM) 5 MG tablet Place 1 tablet in vagina the night prior to PT 15 tablet 0  . DULoxetine (CYMBALTA) 60 MG capsule Take 1 capsule (60 mg total) by mouth daily. 30 capsule 3  . estradiol (CLIMARA - DOSED IN MG/24 HR) 0.05 mg/24hr patch Place onto the skin.    . cloNIDine (CATAPRES) 0.2 MG tablet Take 1 tablet (0.2 mg total) by mouth at bedtime. 30 tablet 2   No current facility-administered medications for this visit.    Allergies  Allergen Reactions  . Buprenorphine Nausea Only    Health Maintenance Health Maintenance  Topic Date Due  . INFLUENZA VACCINE  05/17/2018 (Originally 04/26/2017)  . TETANUS/TDAP  05/26/2018 (Originally 06/21/2009)  . PAP SMEAR  09/13/2018  . HIV Screening  Completed     Exam:  BP 99/65   Pulse 67   LMP 12/11/2014 (Approximate)  Gen: Well NAD HEENT: EOMI,  MMM Lungs: Normal work of breathing. CTABL Heart: RRR no MRG Abd: NABS, Soft. Nondistended, Nontender Exts: Brisk capillary refill, warm and well perfused.  Psych alert and oriented normal speech thought process and affect no SI or  HI expressed. Depression screen [facility] 2/9 06/21/2017 06/07/2017 11/14/2016  Decreased Interest Down, Depressed, Hopeless PHQ - 2 Score Altered sleeping Tired, decreased energy Change in appetite Feeling bad or failure about yourself  Trouble concentrating Moving slowly or fidgety/restless 3 3 0  Suicidal thoughts 0 0 0  PHQ-9 Score Some  encounter information is confidential and restricted. Go to Review Flowsheets activity to see all data.   GAD 7 : Generalized Anxiety Score 06/21/2017 06/07/2017 11/14/2016  Nervous, Anxious, on Edge Control/stop worrying Worry too much - different things Trouble relaxing Restless Easily annoyed or irritable Afraid - awful might happen Total GAD 7 Score Anxiety Difficulty - - Extremely difficult  Some encounter information is confidential and restricted. Go to Review Flowsheets activity to see all data.      No results found for this or any previous visit (from the past 72 hour(s)). No results found.    Assessment and Plan: 26 y.o. female with  Opioid dependency doing reasonably well. We'll check back on referral. We'll prescribe clonidine for use at bedtime and recheck in 1 month.  Anxiety and depression: Worsening recently.  Increase Cymbalta to 60 mg. Additionally patient will restart counseling/therapy. Recheck in one month.   No orders of the defined types were placed in this encounter.  Meds ordered this encounter  Medications  . cloNIDine (CATAPRES) 0.2 MG tablet    Sig: Take 1 tablet (0.2 mg total) by mouth at bedtime.    Dispense:  30 tablet    Refill:  2  . DULoxetine (CYMBALTA) 60 MG capsule    Sig: Take 1 capsule (60 mg total) by mouth daily.    Dispense:  30 capsule    Refill:  3     Discussed warning signs or symptoms. Please see discharge instructions. Patient expresses understanding.  I spent 25 minutes with this patient, greater than 50% was face-to-face time counseling regarding ddx and treatment plan

## 2017-07-05 ENCOUNTER — Ambulatory Visit (INDEPENDENT_AMBULATORY_CARE_PROVIDER_SITE_OTHER): Payer: 59 | Admitting: Family Medicine

## 2017-07-05 VITALS — BP 99/62 | HR 59 | Temp 98.0°F | Wt 142.0 lb

## 2017-07-05 DIAGNOSIS — R109 Unspecified abdominal pain: Secondary | ICD-10-CM | POA: Diagnosis not present

## 2017-07-05 LAB — POCT URINALYSIS DIPSTICK
Bilirubin, UA: NEGATIVE
Blood, UA: NEGATIVE
Glucose, UA: NEGATIVE
Ketones, UA: NEGATIVE
LEUKOCYTES UA: NEGATIVE
NITRITE UA: NEGATIVE
PH UA: 5.5 (ref 5.0–8.0)
PROTEIN UA: NEGATIVE
Spec Grav, UA: 1.02 (ref 1.010–1.025)
Urobilinogen, UA: 0.2 E.U./dL

## 2017-07-05 MED ORDER — TAMSULOSIN HCL 0.4 MG PO CAPS: 0.4000 mg | ORAL_CAPSULE | Freq: Every day | ORAL | 0 refills | Status: DC

## 2017-07-05 MED ORDER — CEPHALEXIN 500 MG PO CAPS: 500.0000 mg | ORAL_CAPSULE | Freq: Two times a day (BID) | ORAL | 0 refills | Status: DC

## 2017-07-05 NOTE — Patient Instructions (Addendum)
Thank you for coming in today. Use a heating pad.,  Work on [employer].  Take keflex and flomax.  Recheck with me in 2 weeks as scheduled.  Return sooner if needed.    Flank Pain, Adult Flank pain is pain that is located on the side of the body between the upper abdomen and the back. This area is called the flank. The pain may occur over a short period of time (acute), or it may be long-term or recurring (chronic). It may be mild or severe. Flank pain can be caused by many things, including:  Muscle soreness or injury.  Kidney stones or kidney disease.  Stress.  A disease of the spine (vertebral disk disease).  A lung infection (pneumonia).  Fluid around the lungs (pulmonary edema).  A skin rash caused by the chickenpox virus (shingles).  Tumors that affect the back of the abdomen.  Gallbladder disease.  Follow these instructions at home:  Drink enough fluid to keep your urine clear or pale yellow.  Rest as told by your health care provider.  Take over-the-counter and prescription medicines only as told by your health care provider.  Keep a journal to track what has caused your flank pain and what has made it feel better.  Keep all follow-up visits as told by your health care provider. This is important. Contact a health care provider if:  Your pain is not controlled with medicine.  You have new symptoms.  Your pain gets worse.  You have a fever.  Your symptoms last longer than 2-3 days.  You have trouble urinating or you are urinating very frequently. Get help right away if:  You have trouble breathing or you are short of breath.  Your abdomen hurts or it is swollen or red.  You have nausea or vomiting.  You feel faint or you pass out.  You have blood in your urine. Summary  Flank pain is pain that is located on the side of the body between the upper abdomen and the back.  The pain may occur over a short period of time (acute), or it may be  long-term or recurring (chronic). It may be mild or severe.  Flank pain can be caused by many things.  Contact your health care provider if your symptoms get worse or they last longer than 2-3 days. This information is not intended to replace advice given to you by your health care provider. Make sure you discuss any questions you have with your health care provider. Document Released: 11/03/2005 Document Revised: 11/25/2016 Document Reviewed: 11/25/2016 Elsevier Interactive Patient Education  2018 [employer].

## 2017-07-05 NOTE — Progress Notes (Signed)
Amanda Vazquez is a 26 y.o. female who presents to [facility]: Primary Care Sports Medicine today for left low back/flank pain. This has been ongoing for 1 week now. She notes the pain radiates from the left low back/flank to the left pelvis. She notes that the symptoms worsen with urination and are somewhat consistant with prior episodes of urinary tract infection or kidney stone. She denies any vaginal discharge vomiting or diarrhea. She's tried some over-the-counter medications for pain which helped a bit.   Past Medical History:  Diagnosis Date  . Common migraine with intractable migraine 12/16/2016  . Endometriosis   . Frequency of urination   . Migraine   . Pelvic pain in female   . Polycystic ovary disease    Past Surgical History:  Procedure Laterality Date  . ABDOMINAL HYSTERECTOMY    . DX LAPAROSCOPY W/ LASER ABLATION OF ENDOMETRIOSIS  06-12-2014   [facility]  . LAPAROSCOPIC OVARIAN CYSTECTOMY Left 01/08/2015   Procedure: LAPAROSCOPIC OVARIAN CYSTECTOMY;  Surgeon: Richardean Chimera, MD;  Location: [facility];  Service: Gynecology;  Laterality: Left;  . LAPAROSCOPY N/A 01/08/2015   Procedure: LAPAROSCOPY DIAGNOSTIC;  Surgeon: Richardean Chimera, MD;  Location: [facility];  Service: Gynecology;  Laterality: N/A;  . TONSILLECTOMY  age 61   Social History  Substance Use Topics  . Smoking status: Never Smoker  . Smokeless tobacco: Never Used  . Alcohol use Yes     Comment: occasional   family history includes Alcohol abuse in her mother; Cancer in her maternal grandfather, maternal grandmother, and paternal grandfather; Diabetes in her maternal grandfather and sister; Hypertension in her maternal grandfather and paternal grandmother.  ROS as above:  Medications: Current Outpatient Prescriptions  Medication Sig Dispense Refill  . cephALEXin (KEFLEX)  500 MG capsule Take 1 capsule (500 mg total) by mouth 2 (two) times daily. 14 capsule 0  . cloNIDine (CATAPRES) 0.2 MG tablet Take 1 tablet (0.2 mg total) by mouth at bedtime. 30 tablet 2  . diazepam (VALIUM) 5 MG tablet Place 1 tablet in vagina the night prior to PT 15 tablet 0  . DULoxetine (CYMBALTA) 60 MG capsule Take 1 capsule (60 mg total) by mouth daily. 30 capsule 3  . estradiol (CLIMARA - DOSED IN MG/24 HR) 0.05 mg/24hr patch Place onto the skin.    . tamsulosin (FLOMAX) 0.4 MG CAPS capsule Take 1 capsule (0.4 mg total) by mouth daily after breakfast. 14 capsule 0   No current facility-administered medications for this visit.    Allergies  Allergen Reactions  . Buprenorphine Nausea Only    Health Maintenance Health Maintenance  Topic Date Due  . INFLUENZA VACCINE  05/17/2018 (Originally 04/26/2017)  . TETANUS/TDAP  05/26/2018 (Originally 06/21/2009)  . PAP SMEAR  09/13/2018  . HIV Screening  Completed     Exam:  BP 99/62   Pulse (!) 59   Temp 98 F (36.7 C) (Oral)   Wt 142 lb (64.4 kg)   LMP 12/11/2014 (Approximate)   SpO2 100%   BMI 24.37 kg/m  Gen: Well NAD HEENT: EOMI,  MMM Lungs: Normal work of breathing. CTABL Heart: RRR no MRG Abd: NABS, Soft. Nondistended, Nontender CVA angle tenderness to palpation on the left nontender on the right Exts: Brisk capillary refill, warm and well perfused. . MSK: Nontender to spinal midline. Tender palpation left lumbar paraspinal muscles. Decreased lumbar motion due to pain.   Results for orders placed  or performed in visit on 07/05/17 (from the past 72 hour(s))  POCT Urinalysis Dipstick     Status: None   Collection Time: 07/05/17 10:10 AM  Result Value Ref Range   Color, UA yellow    Clarity, UA clear    Glucose, UA neg    Bilirubin, UA neg    Ketones, UA neg    Spec Grav, UA 1.020 1.010 - 1.025   Blood, UA neg    pH, UA 5.5 5.0 - 8.0   Protein, UA neg    Urobilinogen, UA 0.2 0.2 or 1.0 E.U./dL   Nitrite, UA neg      Leukocytes, UA Negative Negative   No results found.    Assessment and Plan: 26 y.o. female with left flank pain either urinary-related kidney stone/UTI or MSK related. Urinalysis was largely unremarkable. Urine culture pending which should be helpful. We'll start empiric treatment with Flomax and Keflex as this is worked well in the past for similar symptoms.  Plan for heating pad and NSAIDs for treatment for potential MSK etiology such as lumbosacral strain. Plan to recheck as previously scheduled in 2 weeks. Return sooner if needed.   Orders Placed This Encounter  Procedures  . Urine Culture  . POCT Urinalysis Dipstick   Meds ordered this encounter  Medications  . cephALEXin (KEFLEX) 500 MG capsule    Sig: Take 1 capsule (500 mg total) by mouth 2 (two) times daily.    Dispense:  14 capsule    Refill:  0  . tamsulosin (FLOMAX) 0.4 MG CAPS capsule    Sig: Take 1 capsule (0.4 mg total) by mouth daily after breakfast.    Dispense:  14 capsule    Refill:  0     Discussed warning signs or symptoms. Please see discharge instructions. Patient expresses understanding.

## 2017-07-06 LAB — URINE CULTURE
MICRO NUMBER: 81129747
SPECIMEN QUALITY: ADEQUATE

## 2017-07-19 ENCOUNTER — Encounter: Payer: Self-pay | Admitting: Family Medicine

## 2017-07-19 ENCOUNTER — Ambulatory Visit (INDEPENDENT_AMBULATORY_CARE_PROVIDER_SITE_OTHER): Payer: 59 | Admitting: Family Medicine

## 2017-07-19 VITALS — BP 100/65 | HR 60 | Wt 141.0 lb

## 2017-07-19 DIAGNOSIS — F331 Major depressive disorder, recurrent, moderate: Secondary | ICD-10-CM

## 2017-07-19 DIAGNOSIS — F411 Generalized anxiety disorder: Secondary | ICD-10-CM

## 2017-07-19 DIAGNOSIS — K589 Irritable bowel syndrome without diarrhea: Secondary | ICD-10-CM | POA: Diagnosis not present

## 2017-07-19 MED ORDER — DIAZEPAM 5 MG PO TABS: ORAL_TABLET | ORAL | 0 refills | Status: DC

## 2017-07-19 MED ORDER — DULOXETINE HCL 60 MG PO CPEP: 60.0000 mg | ORAL_CAPSULE | Freq: Every day | ORAL | 3 refills | Status: DC

## 2017-07-19 MED ORDER — DICYCLOMINE HCL 10 MG PO CAPS: 10.0000 mg | ORAL_CAPSULE | Freq: Three times a day (TID) | ORAL | 3 refills | Status: DC

## 2017-07-19 NOTE — Patient Instructions (Signed)
Thank you for coming in today.  Use bentyl for gut spasm.   [facility].   Recheck in 2 months or sooner if needed.

## 2017-07-19 NOTE — Progress Notes (Signed)
Amanda Vazquez is a 26 y.o. female who presents to [facility]: Primary Care Sports Medicine today for right-sided abdominal pain.  Patient returns to clinic today for abdominal pain.  She has a history of chronic intermittent abdominal pain.  She was seen a few weeks ago for flank pain thought to be due to urinary tract infection.  Urine culture however was unremarkable.  She denies any urinary frequency urgency or dysuria.  Her current symptoms are not consistent with UTI.  She denies any vomiting but does note some occasional diarrhea.  She denies any significant constipation.   Anxiety and depression: Patient additionally has been seen for anxiety and depression in the past.  She currently takes Cymbalta 60 mg daily.  She tolerates medication well with no issues.  She notes continued anxiety depression symptoms listed below.  No SI or HI expressed.   Past Medical History:  Diagnosis Date  . Common migraine with intractable migraine 12/16/2016  . Endometriosis   . Frequency of urination   . Migraine   . Pelvic pain in female   . Polycystic ovary disease    Past Surgical History:  Procedure Laterality Date  . ABDOMINAL HYSTERECTOMY    . DX LAPAROSCOPY W/ LASER ABLATION OF ENDOMETRIOSIS  06-12-2014   [facility]  . LAPAROSCOPIC OVARIAN CYSTECTOMY Left 01/08/2015   Procedure: LAPAROSCOPIC OVARIAN CYSTECTOMY;  Surgeon: Richardean Chimera, MD;  Location: [facility] [place];  Service: Gynecology;  Laterality: Left;  . LAPAROSCOPY N/A 01/08/2015   Procedure: LAPAROSCOPY DIAGNOSTIC;  Surgeon: Richardean Chimera, MD;  Location: [facility];  Service: Gynecology;  Laterality: N/A;  . TONSILLECTOMY  age 89   Social History  Substance Use Topics  . Smoking status: Never Smoker  . Smokeless tobacco: Never Used  . Alcohol use Yes     Comment: occasional   family history includes  Alcohol abuse in her mother; Cancer in her maternal grandfather, maternal grandmother, and paternal grandfather; Diabetes in her maternal grandfather and sister; Hypertension in her maternal grandfather and paternal grandmother.  ROS as above:  Medications: Current Outpatient Prescriptions  Medication Sig Dispense Refill  . cephALEXin (KEFLEX) 500 MG capsule Take 1 capsule (500 mg total) by mouth 2 (two) times daily. 14 capsule 0  . cloNIDine (CATAPRES) 0.2 MG tablet Take 1 tablet (0.2 mg total) by mouth at bedtime. 30 tablet 2  . diazepam (VALIUM) 5 MG tablet Place 1 tablet in vagina the night prior to PT 15 tablet 0  . DULoxetine (CYMBALTA) 60 MG capsule Take 1 capsule (60 mg total) by mouth daily. 90 capsule 3  . estradiol (CLIMARA - DOSED IN MG/24 HR) 0.05 mg/24hr patch Place onto the skin.    . tamsulosin (FLOMAX) 0.4 MG CAPS capsule Take 1 capsule (0.4 mg total) by mouth daily after breakfast. 14 capsule 0  . dicyclomine (BENTYL) 10 MG capsule Take 1 capsule (10 mg total) by mouth 4 (four) times daily -  before meals and at bedtime. 120 capsule 3   No current facility-administered medications for this visit.    Allergies  Allergen Reactions  . Buprenorphine Nausea Only    Health Maintenance Health Maintenance  Topic Date Due  . INFLUENZA VACCINE  05/17/2018 (Originally 04/26/2017)  . TETANUS/TDAP  05/26/2018 (Originally 06/21/2009)  . PAP SMEAR  09/13/2018  . HIV Screening  Completed     Exam:  BP 100/65   Pulse 60   Wt 141 lb (  64 kg)   LMP 12/11/2014 (Approximate)   BMI 24.20 kg/m  Gen: Well NAD HEENT: EOMI,  MMM Lungs: Normal work of breathing. CTABL Heart: RRR no MRG Abd: NABS, Soft. Nondistended, minimally tender right lower quadrant with no rebound or guarding.  No masses palpated. Exts: Brisk capillary refill, warm and well perfused.   Depression screen [facility] 07/19/2017 06/21/2017 06/07/2017 11/14/2016  Decreased Interest 3 2 2 2   Down, Depressed, Hopeless 3 2  2 3   PHQ - 2 Score 6 4 4 5   Altered sleeping 3 3 2 3   Tired, decreased energy 1 3 3 3   Change in appetite 3 3 3 3   Feeling bad or failure about yourself  1 2 1 2   Trouble concentrating 3 3 2 1   Moving slowly or fidgety/restless 1 3 3  0  Suicidal thoughts 0 0 0 0  PHQ-9 Score 18 21 18 17   Difficult doing work/chores Very difficult - - -  Some encounter information is confidential and restricted. Go to Review Flowsheets activity to see all data.   GAD 7 : Generalized Anxiety Score 07/19/2017 06/21/2017 06/07/2017 11/14/2016  Nervous, Anxious, on Edge 3 3 3 3   Control/stop worrying 3 3 3 3   Worry too much - different things 3 3 3 3   Trouble relaxing 3 3 3 3   Restless 3 3 3 3   Easily annoyed or irritable 3 3 3 3   Afraid - awful might happen 3 3 3 1   Total GAD 7 Score 21 21 21 19   Anxiety Difficulty Very difficult - - Extremely difficult  Some encounter information is confidential and restricted. Go to Review Flowsheets activity to see all data.    \   No results found for this or any previous visit (from the past 72 hour(s)). No results found.    Assessment and Plan: 26 y.o. female with  Flank pain unclear etiology.  At this point I think it is probably more related to functional bowel disease.spasm.  Plan to try Bentyl and recheck in a month or 2.  Return sooner if needed.  Refill Valium for use with pelvic physical therapy as this is quite helpful.  Anxiety depression: Stable but not well controlled.  Continue Cymbalta refer to behavioral health for therapy.  Recheck in 2 months.   Orders Placed This Encounter  Procedures  . Ambulatory referral to Behavioral Health    Referral Priority:   Routine    Referral Type:   Psychiatric    Referral Reason:   Specialty Services Required    Requested Specialty:   Behavioral Health    Number of Visits Requested:   1   Meds ordered this encounter  Medications  . dicyclomine (BENTYL) 10 MG capsule    Sig: Take 1 capsule (10 mg  total) by mouth 4 (four) times daily -  before meals and at bedtime.    Dispense:  120 capsule    Refill:  3  . DULoxetine (CYMBALTA) 60 MG capsule    Sig: Take 1 capsule (60 mg total) by mouth daily.    Dispense:  90 capsule    Refill:  3  . diazepam (VALIUM) 5 MG tablet    Sig: Place 1 tablet in vagina the night prior to PT    Dispense:  15 tablet    Refill:  0     Discussed warning signs or symptoms. Please see discharge instructions. Patient expresses understanding.

## 2017-08-11 ENCOUNTER — Encounter: Payer: Self-pay | Admitting: Family Medicine

## 2017-08-11 ENCOUNTER — Ambulatory Visit (INDEPENDENT_AMBULATORY_CARE_PROVIDER_SITE_OTHER): Payer: 59 | Admitting: Family Medicine

## 2017-08-11 VITALS — BP 91/63 | HR 78 | Temp 97.8°F | Wt 139.0 lb

## 2017-08-11 DIAGNOSIS — K589 Irritable bowel syndrome without diarrhea: Secondary | ICD-10-CM

## 2017-08-11 MED ORDER — ELUXADOLINE 100 MG PO TABS: 100.0000 mg | ORAL_TABLET | Freq: Two times a day (BID) | ORAL | 3 refills | Status: DC

## 2017-08-11 NOTE — Patient Instructions (Signed)
Thank you for coming in today. Start Viberzi.,  Recheck as scheduled.  If your belly pain worsens, or you have high fever, bad vomiting, blood in your stool or black tarry stool go to the Emergency Room.   Eluxadoline oral tablets What is this medicine? ELUXADOLINE (ee LUX ah dol ine) is an intestinal disorder drug. It is used to treat irritable bowel syndrome with diarrhea. This medicine may be used for other purposes; ask your health care provider or pharmacist if you have questions. COMMON BRAND NAME(S): Viberzi What should I tell my health care provider before I take this medicine? They need to know if you have any of these conditions: -drink more than 3 alcohol-containing drinks per day -gallbladder disease -have no gallbladder -history of constipation -history of drug or alcohol abuse problems -history of pancreatitis or pancreatic disease -liver disease -stomach or intestine problems -an unusual or allergic reaction to eluxadoline, other medicines, foods, dyes, or preservatives -pregnant or trying to get pregnant -breast-feeding How should I use this medicine? Take this medicine by mouth with a glass of water. Follow the directions on the prescription label. Take this medicine with food. Take your medicine at regular intervals. Do not take it more often than directed. Do not stop taking except on your doctor's advice. Talk to your pediatrician regarding the use of this medicine in children. Special care may be needed. Overdosage: If you think you have taken too much of this medicine contact a poison control center or emergency room at once. NOTE: This medicine is only for you. Do not share this medicine with others. What if I miss a dose? If you miss a dose, take it as soon as you can. If it is almost time for your next dose, take only that dose. Do not take double or extra doses. What may interact with this medicine? This medicine may interact with the following  medications: -alosetron -anticholinergics -bupropion -certain antibiotics like ciprofloxacin, clarithromycin, rifampin -cyclosporine -eltrombopag -ergot alkaloids like dihydroergotamine, ergonovine, ergotamine, methylergonovine -fluconazole -gemfibrozil -loperamide -opiate agonist -paroxetine -pimozide -probenecid -quinidine -rosuvastatin -sirolimus -tacrolimus This list may not describe all possible interactions. Give your health care provider a list of all the medicines, herbs, non-prescription drugs, or dietary supplements you use. Also tell them if you smoke, drink alcohol, or use illegal drugs. Some items may interact with your medicine. What should I watch for while using this medicine? Tell your doctor or healthcare professional if your symptoms do not start to get better or if they get worse. This medicine may cause constipation. If you do not have a bowel movement, call your doctor or healthcare professional. You may get drowsy or dizzy. Do not drive, use machinery, or do anything that needs mental alertness until you know how this medicine affects you. Do not stand or sit up quickly, especially if you are an older patient. This reduces the risk of dizzy or fainting spells. Alcohol may interfere with the effect of this medicine. Avoid alcoholic drinks. What side effects may I notice from receiving this medicine? Side effects that you should report to your doctor or health care professional as soon as possible: -allergic reactions like skin rash, itching or hives, swelling of the face, lips, or tongue -breathing problems -constipation -right upper belly pain Side effects that usually do not require medical attention (report to your doctor or health care professional if they continue or are bothersome): -dizziness -nausea, vomiting -tiredness This list may not describe all possible side effects. Call your doctor  for medical advice about side effects. You may report side  effects to FDA at 1-800-FDA-1088. Where should I keep my medicine? Keep out of the reach of children. Store at room temperature between 20 and 25 degrees C (68 and 77 degrees F). Throw away any unused medicine after the expiration date. NOTE: This sheet is a summary. It may not cover all possible information. If you have questions about this medicine, talk to your doctor, pharmacist, or health care provider.  2018 Elsevier/Gold Standard (2015-12-10 11:03:55)

## 2017-08-11 NOTE — Progress Notes (Signed)
Amanda Vazquez is a 26 y.o. female who [facility] today for abdominal pain.  Patient has bilateral lower pelvis abdominal cramping and pain.  This is been ongoing now for about 8 days.  This is worse than her baseline chronic abdominal pain.  She denies any vomiting or diarrhea or severe constipation.  She is able to have bowel movements and pass gas.  She denies any urinary symptoms.  She was seen at urgent care recently where she had a urine culture and was treated empirically with Augmentin.  Urine culture is growing lactobacillus species 25,000 colony-forming units with no sensitivity obtained.  She was also prescribed oxycodone which has helped her pain.  She is tried taking her prescription for Bentyl which has not helped.  She is used ibuprofen and Tylenol which helped a little.   Past Medical History:  Diagnosis Date  . Common migraine with intractable migraine 12/16/2016  . Endometriosis   . Frequency of urination   . Migraine   . Pelvic pain in female   . Polycystic ovary disease    Past Surgical History:  Procedure Laterality Date  . ABDOMINAL HYSTERECTOMY    . DX LAPAROSCOPY W/ LASER ABLATION OF ENDOMETRIOSIS  06-12-2014   [facility]  . LAPAROSCOPIC OVARIAN CYSTECTOMY Left 01/08/2015   [facility]McComb[facility] John[facility] [place]  . LAPAROSCOPY DIAGNOSTIC N/A 01/08/2015   Performed by [facility]eraMcComb, John, [facility] [place]  . TONSILLECTOMY  age 26   Social History   Tobacco Use  . Smoking status: Never Smoker  . Smokeless tobacco: Never Used  Substance Use Topics  . Alcohol use: Yes    Comment: occasional   family history includes Alcohol abuse in her mother; Cancer in her maternal grandfather, maternal grandmother, and paternal grandfather; Diabetes in her maternal grandfather and sister; Hypertension  in her maternal grandfather and paternal grandmother.  ROS as above:  Medications: Current Outpatient Medications  Medication Sig Dispense Refill  . cephALEXin (KEFLEX) 500 MG capsule Take 1 capsule (500 mg total) by mouth 2 (two) times daily. 14 capsule 0  . cloNIDine (CATAPRES) 0.2 MG tablet Take 1 tablet (0.2 mg total) by mouth at bedtime. 30 tablet 2  . diazepam (VALIUM) 5 MG tablet Place 1 tablet in vagina the night prior to PT 15 tablet 0  . dicyclomine (BENTYL) 10 MG capsule Take 1 capsule (10 mg total) by mouth 4 (four) times daily -  before meals and at bedtime. 120 capsule 3  . DULoxetine (CYMBALTA) 60 MG capsule Take 1 capsule (60 mg total) by mouth daily. 90 capsule 3  . estradiol (CLIMARA - DOSED IN MG/24 HR) 0.05 mg/24hr patch Place onto the skin.    . tamsulosin (FLOMAX) 0.4 MG CAPS capsule Take 1 capsule (0.4 mg total) by mouth daily after breakfast. 14 capsule 0  . Eluxadoline (VIBERZI) 100 MG TABS Take 1 tablet (100 mg total) 2 (two) times daily by mouth. 60 tablet 3   No current facility-administered medications for this visit.    Allergies  Allergen Reactions  . Buprenorphine Nausea Only    Health Maintenance Health Maintenance  Topic Date Due  . INFLUENZA VACCINE  05/17/2018 (Originally 04/26/2017)  . TETANUS/TDAP  05/26/2018 (Originally 06/21/2009)  . PAP SMEAR  09/13/2018  . HIV Screening  Completed     Exam:  BP 91/63   Pulse 78  Temp 97.8 F (36.6 C) (Oral)   Wt 139 lb (63 kg)   LMP 12/11/2014 (Approximate)   BMI 23.86 kg/m  Gen: Well NAD uncomfortable appearing HEENT: EOMI,  MMM Lungs: Normal work of breathing. CTABL Heart: RRR no MRG Abd: NABS, Soft. Nondistended, mildly tender to palpation bilateral lower pelvis with no rebound or guarding. Exts: Brisk capillary refill, warm and well perfused.    No results found for this or any previous visit (from the past 72 hour(s)). No results found.    Assessment and Plan: 26 y.o. female with  exacerbation of chronic abdominal pain.  Etiology is unclear.  Devlin has had extensive workup previously for this.  I am doubtful for serious life-threatening etiologies such as appendicitis small bowel obstruction etc.  I suspect this is an exacerbation of her chronic pain likely related to IBS however we do not have a definitive diagnosis.  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  Plan to use Viberzi which is opiate modulator specifically for abdominal cramping with IBS.  I think this probably will be helpful.  If she continues to experience chronic abdominal pain I think the next step would be referral to pain management.   No orders of the defined types were placed in this encounter.  Meds ordered this encounter  Medications  . DISCONTD: Eluxadoline (VIBERZI) 100 MG TABS    Sig: Take 1 tablet (100 mg total) 2 (two) times daily by mouth.    Dispense:  60 tablet    Refill:  3  . Eluxadoline (VIBERZI) 100 MG TABS    Sig: Take 1 tablet (100 mg total) 2 (two) times daily by mouth.    Dispense:  60 tablet    Refill:  3     Discussed warning signs or symptoms. Please see discharge instructions. Patient expresses understanding.

## 2017-08-22 ENCOUNTER — Ambulatory Visit (INDEPENDENT_AMBULATORY_CARE_PROVIDER_SITE_OTHER): Payer: 59 | Admitting: Licensed Clinical Social Worker

## 2017-08-22 DIAGNOSIS — F411 Generalized anxiety disorder: Secondary | ICD-10-CM | POA: Diagnosis not present

## 2017-08-22 DIAGNOSIS — F331 Major depressive disorder, recurrent, moderate: Secondary | ICD-10-CM

## 2017-08-22 DIAGNOSIS — F410 Panic disorder [episodic paroxysmal anxiety] without agoraphobia: Secondary | ICD-10-CM
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
## 2017-09-01 ENCOUNTER — Other Ambulatory Visit: Payer: Self-pay | Admitting: Family Medicine

## 2017-09-12 ENCOUNTER — Ambulatory Visit (HOSPITAL_COMMUNITY): Payer: Self-pay | Admitting: Psychiatry

## 2017-09-13 ENCOUNTER — Ambulatory Visit: Payer: 59 | Admitting: Family Medicine

## 2017-09-13 DIAGNOSIS — Z0189 Encounter for other specified special examinations: Secondary | ICD-10-CM

## 2017-09-15 ENCOUNTER — Ambulatory Visit: Payer: 59 | Admitting: Family Medicine

## 2017-09-15 ENCOUNTER — Ambulatory Visit (INDEPENDENT_AMBULATORY_CARE_PROVIDER_SITE_OTHER): Payer: 59 | Admitting: Family Medicine

## 2017-09-15 DIAGNOSIS — R102 Pelvic and perineal pain: Secondary | ICD-10-CM | POA: Diagnosis not present

## 2017-09-15 DIAGNOSIS — F411 Generalized anxiety disorder: Secondary | ICD-10-CM | POA: Diagnosis not present

## 2017-09-15 DIAGNOSIS — F419 Anxiety disorder, unspecified: Secondary | ICD-10-CM | POA: Diagnosis not present

## 2017-09-15 DIAGNOSIS — K589 Irritable bowel syndrome without diarrhea: Secondary | ICD-10-CM | POA: Diagnosis not present

## 2017-09-15 DIAGNOSIS — G8929 Other chronic pain: Secondary | ICD-10-CM

## 2017-09-15 MED ORDER — DIAZEPAM 5 MG PO TABS: ORAL_TABLET | ORAL | 0 refills | Status: DC

## 2017-09-15 MED ORDER — BUSPIRONE HCL 5 MG PO TABS: 5.0000 mg | ORAL_TABLET | Freq: Three times a day (TID) | ORAL | 0 refills | Status: DC | PRN

## 2017-09-15 MED ORDER — SERTRALINE HCL 25 MG PO TABS: ORAL_TABLET | ORAL | 1 refills | Status: DC

## 2017-09-15 NOTE — Patient Instructions (Signed)
Thank you for coming in today. For anxiety continue therapy.  Start zoloft 1 pill daily.  Increase to 2 pills daily after 1 week.  Recheck in about 2 weeks.  Use buspar up to 3x daily as needed for severe anxiety.  Let me know if you get worse.   Sertraline tablets What is this medicine? SERTRALINE (SER tra leen) is used to treat depression. It may also be used to treat obsessive compulsive disorder, panic disorder, post-trauma stress, premenstrual dysphoric disorder (PMDD) or social anxiety. This medicine may be used for other purposes; ask your health care provider or pharmacist if you have questions. COMMON BRAND NAME(S): Zoloft What should I tell my health care provider before I take this medicine? They need to know if you have any of these conditions: -bleeding disorders -bipolar disorder or a family history of bipolar disorder -glaucoma -heart disease -high blood pressure -history of irregular heartbeat -history of low levels of calcium, magnesium, or potassium in the blood -if you often drink alcohol -liver disease -receiving electroconvulsive therapy -seizures -suicidal thoughts, plans, or attempt; a previous suicide attempt by you or a family member -take medicines that treat or prevent blood clots -thyroid disease -an unusual or allergic reaction to sertraline, other medicines, foods, dyes, or preservatives -pregnant or trying to get pregnant -breast-feeding How should I use this medicine? Take this medicine by mouth with a glass of water. Follow the directions on the prescription label. You can take it with or without food. Take your medicine at regular intervals. Do not take your medicine more often than directed. Do not stop taking this medicine suddenly except upon the advice of your doctor. Stopping this medicine too quickly may cause serious side effects or your condition may worsen. A special MedGuide will be given to you by the pharmacist with each prescription and  refill. Be sure to read this information carefully each time. Talk to your pediatrician regarding the use of this medicine in children. While this drug may be prescribed for children as young as 7 years for selected conditions, precautions do apply. Overdosage: If you think you have taken too much of this medicine contact a poison control center or emergency room at once. NOTE: This medicine is only for you. Do not share this medicine with others. What if I miss a dose? If you miss a dose, take it as soon as you can. If it is almost time for your next dose, take only that dose. Do not take double or extra doses. What may interact with this medicine? Do not take this medicine with any of the following medications: -cisapride -dofetilide -dronedarone -linezolid -MAOIs like Carbex, Eldepryl, Marplan, Nardil, and Parnate -methylene blue (injected into a vein) -pimozide -thioridazine This medicine may also interact with the following medications: -alcohol -amphetamines -aspirin and aspirin-like medicines -certain medicines for depression, anxiety, or psychotic disturbances -certain medicines for fungal infections like ketoconazole, fluconazole, posaconazole, and itraconazole -certain medicines for irregular heart beat like flecainide, quinidine, propafenone -certain medicines for migraine headaches like almotriptan, eletriptan, frovatriptan, naratriptan, rizatriptan, sumatriptan, zolmitriptan -certain medicines for sleep -certain medicines for seizures like carbamazepine, valproic acid, phenytoin -certain medicines that treat or prevent blood clots like warfarin, enoxaparin, dalteparin -cimetidine -digoxin -diuretics -fentanyl -isoniazid -lithium -NSAIDs, medicines for pain and inflammation, like ibuprofen or naproxen -other medicines that prolong the QT interval (cause an abnormal heart rhythm) -rasagiline -safinamide -supplements like St. John's wort, kava kava,  valerian -tolbutamide -tramadol -tryptophan This list may not describe all possible interactions.  Give your health care provider a list of all the medicines, herbs, non-prescription drugs, or dietary supplements you use. Also tell them if you smoke, drink alcohol, or use illegal drugs. Some items may interact with your medicine. What should I watch for while using this medicine? Tell your doctor if your symptoms do not get better or if they get worse. Visit your doctor or health care professional for regular checks on your progress. Because it may take several weeks to see the full effects of this medicine, it is important to continue your treatment as prescribed by your doctor. Patients and their families should watch out for new or worsening thoughts of suicide or depression. Also watch out for sudden changes in feelings such as feeling anxious, agitated, panicky, irritable, hostile, aggressive, impulsive, severely restless, overly excited and hyperactive, or not being able to sleep. If this happens, especially at the beginning of treatment or after a change in dose, call your health care professional. Dennis Bast may get drowsy or dizzy. Do not drive, use machinery, or do anything that needs mental alertness until you know how this medicine affects you. Do not stand or sit up quickly, especially if you are an older patient. This reduces the risk of dizzy or fainting spells. Alcohol may interfere with the effect of this medicine. Avoid alcoholic drinks. Your mouth may get dry. Chewing sugarless gum or sucking hard candy, and drinking plenty of water may help. Contact your doctor if the problem does not go away or is severe. What side effects may I notice from receiving this medicine? Side effects that you should report to your doctor or health care professional as soon as possible: -allergic reactions like skin rash, itching or hives, swelling of the face, lips, or tongue -anxious -black, tarry  stools -changes in vision -confusion -elevated mood, decreased need for sleep, racing thoughts, impulsive behavior -eye pain -fast, irregular heartbeat -feeling faint or lightheaded, falls -feeling agitated, angry, or irritable -hallucination, loss of contact with reality -loss of balance or coordination -loss of memory -painful or prolonged erections -restlessness, pacing, inability to keep still -seizures -stiff muscles -suicidal thoughts or other mood changes -trouble sleeping -unusual bleeding or bruising -unusually weak or tired -vomiting Side effects that usually do not require medical attention (report to your doctor or health care professional if they continue or are bothersome): -change in appetite or weight -change in sex drive or performance -diarrhea -increased sweating -indigestion, nausea -tremors This list may not describe all possible side effects. Call your doctor for medical advice about side effects. You may report side effects to FDA at 1-800-FDA-1088. Where should I keep my medicine? Keep out of the reach of children. Store at room temperature between 15 and 30 degrees C (59 and 86 degrees F). Throw away any unused medicine after the expiration date. NOTE: This sheet is a summary. It may not cover all possible information. If you have questions about this medicine, talk to your doctor, pharmacist, or health care provider.  2018 Elsevier/Gold Standard (2016-09-16 14:17:49)

## 2017-09-15 NOTE — Progress Notes (Signed)
Amanda Vazquez is a 26 y.o. female who [facility] today for follow-up abdominal pain.  Amanda Vazquez has chronic abdominal pain.  At the last visit a month ago where she started Viberzi on the theory that her pain is due to IBS.  She notes significant improvement in her pain and feels very well.  She does note however that her anxiety has worsened significantly.  She has a history of both depression and anxiety that typically has been pretty well managed.  However her mom is chronically ill and currently has been hospitalized for the last 2 weeks.  She is very sick and Amanda Vazquez is worried that she may die.  This is caused her anxiety to skyrocket.  She has trouble sleeping and is worried about her mother's health.  She has restarted counseling which she does find helpful.   Past Medical History:  Diagnosis Date  . Common migraine with intractable migraine 12/16/2016  . Endometriosis   . Frequency of urination   . Migraine   . Pelvic pain in female   . Polycystic ovary disease    Past Surgical History:  Procedure Laterality Date  . ABDOMINAL HYSTERECTOMY    . DX LAPAROSCOPY W/ LASER ABLATION OF ENDOMETRIOSIS  06-12-2014   [facility]  . LAPAROSCOPIC OVARIAN CYSTECTOMY Left 01/08/2015   Procedure: LAPAROSCOPIC OVARIAN CYSTECTOMY;  Surgeon: Richardean ChimeraJohn McComb, MD;  [facility] [place];  Service: Gynecology;  Laterality: Left;  . LAPAROSCOPY N/A 01/08/2015   Procedure: LAPAROSCOPY DIAGNOSTIC;  Surgeon: Richardean ChimeraJohn McComb, MD;  [facility] [place];  Service: Gynecology;  Laterality: N/A;  . TONSILLECTOMY  age 26   Social History   Tobacco Use  . Smoking status: Never Smoker  . Smokeless tobacco: Never Used  Substance Use Topics  . Alcohol use: Yes    Comment: occasional   family history includes Alcohol abuse in her mother; Cancer  in her maternal grandfather, maternal grandmother, and paternal grandfather; Diabetes in her maternal grandfather and sister; Hypertension in her maternal grandfather and paternal grandmother.  ROS as above:  Medications: Current Outpatient Medications  Medication Sig Dispense Refill  . busPIRone (BUSPAR) 5 MG tablet Take 1 tablet (5 mg total) by mouth 3 (three) times daily as needed (Anxiety). 90 tablet 0  . cloNIDine (CATAPRES) 0.2 MG tablet TAKE 1 TABLET(0.2 MG) BY MOUTH AT BEDTIME 30 tablet 0  . diazepam (VALIUM) 5 MG tablet Place 1 tablet in vagina the night prior to PT 15 tablet 0  . Eluxadoline (VIBERZI) 100 MG TABS Take 1 tablet (100 mg total) 2 (two) times daily by mouth. 60 tablet 3  . estradiol (CLIMARA - DOSED IN MG/24 HR) 0.05 mg/24hr patch Place onto the skin.    Marland Kitchen. sertraline (ZOLOFT) 25 MG tablet Take 1 pill po daily for 1 week then increase to 2 pills po daily 30 tablet 1   No current facility-administered medications for this visit.    Allergies  Allergen Reactions  . Buprenorphine Nausea Only    Health Maintenance Health Maintenance  Topic Date Due  . INFLUENZA VACCINE  05/17/2018 (Originally 04/26/2017)  . TETANUS/TDAP  05/26/2018 (Originally 06/21/2009)  . PAP SMEAR  09/13/2018  . HIV Screening  Completed     Exam:  LMP 12/11/2014 (Approximate)  Gen: Well NAD HEENT: EOMI,  MMM Lungs: Normal work of breathing. CTABL Heart: RRR no MRG Abd: NABS, Soft. Nondistended, Nontender Exts: Brisk capillary  refill, warm and well perfused.  Psych: Alert and oriented normal speech thought process and affect.  No SI or HI expressed. GAD 7 : Generalized Anxiety Score 09/15/2017 07/19/2017 06/21/2017 06/07/2017  Nervous, Anxious, on Edge 3 3 3 3   Control/stop worrying 3 3 3 3   Worry too much - different things 3 3 3 3   Trouble relaxing 3 3 3 3   Restless 3 3 3 3   Easily annoyed or irritable 3 3 3 3   Afraid - awful might happen 3 3 3 3   Total GAD 7 Score 21 21 21 21     Anxiety Difficulty Very difficult Very difficult - -  Some encounter information is confidential and restricted. Go to Review Flowsheets activity to see all data.       No results found for this or any previous visit (from the past 72 hour(s)). No results found.    Assessment and Plan: 26 y.o. female with  Abdominal pain much improved with Viberzi.  Continue current regimen and recheck.  Anxiety worsening.  Start Zoloft and BuSpar.  Recheck in 2 weeks.  Pelvic spasm: Valium for use for pelvic PT refilled.  Cautioned against using this medication for anxiety.   No orders of the defined types were placed in this encounter.  Meds ordered this encounter  Medications  . diazepam (VALIUM) 5 MG tablet    Sig: Place 1 tablet in vagina the night prior to PT    Dispense:  15 tablet    Refill:  0  . sertraline (ZOLOFT) 25 MG tablet    Sig: Take 1 pill po daily for 1 week then increase to 2 pills po daily    Dispense:  30 tablet    Refill:  1  . busPIRone (BUSPAR) 5 MG tablet    Sig: Take 1 tablet (5 mg total) by mouth 3 (three) times daily as needed (Anxiety).    Dispense:  90 tablet    Refill:  0     Discussed warning signs or symptoms. Please see discharge instructions. Patient expresses understanding.

## 2017-09-20 ENCOUNTER — Telehealth: Payer: Self-pay | Admitting: Family Medicine

## 2017-09-20 NOTE — Telephone Encounter (Signed)
Pt called today and states that you told her to call back if her anxiety has gotten worse and you would work her in? If so, where would you like for me to put her on Thursday?

## 2017-09-20 NOTE — Telephone Encounter (Signed)
Dr.Corey had a cancellation for tomorrow so I got her scheduled to check in at 1:10 09/21/17

## 2017-09-21 ENCOUNTER — Ambulatory Visit (INDEPENDENT_AMBULATORY_CARE_PROVIDER_SITE_OTHER): Payer: 59 | Admitting: Family Medicine

## 2017-09-21 ENCOUNTER — Encounter: Payer: Self-pay | Admitting: Family Medicine

## 2017-09-21 VITALS — BP 142/82 | HR 99 | Ht 64.0 in | Wt 138.0 lb

## 2017-09-21 DIAGNOSIS — F411 Generalized anxiety disorder: Secondary | ICD-10-CM | POA: Diagnosis not present

## 2017-09-21 MED ORDER — CLONAZEPAM 0.5 MG PO TABS: 0.5000 mg | ORAL_TABLET | Freq: Two times a day (BID) | ORAL | 0 refills | Status: DC | PRN

## 2017-09-21 NOTE — Progress Notes (Signed)
Amanda Vazquez is a 26 y.o. female who [facility] today for worsening anxiety.   Amanda Vazquez notes continued anxiety and depression symptoms. He mother is very ill in the hospital with respiratory failure. She remains on BiPap with altered mental status for longer than 3 weeks now. The treating team is thinking about a feeding tube. Amanda Vazquez is asked to make difficult medical decisions about her mother's treatment goals. She finds this very stressful. 1 week ago she was started on Zoloft 25 with a a plan to taper up to 100mg . She also was started on Buspar.  She is still taking 25 mg of Zoloft and does not feel any better.  She notes that because her mom has gotten sicker she has had worsening anxiety.  She has trouble sleeping and is constantly worrying.  She is now also having panic attacks.   Past Medical History:  Diagnosis Date  . Common migraine with intractable migraine 12/16/2016  . Endometriosis   . Frequency of urination   . Migraine   . Pelvic pain in female   . Polycystic ovary disease    Past Surgical History:  Procedure Laterality Date  . ABDOMINAL HYSTERECTOMY    . DX LAPAROSCOPY W/ LASER ABLATION OF ENDOMETRIOSIS  06-12-2014   [facility]  . LAPAROSCOPIC OVARIAN CYSTECTOMY Left 01/08/2015   Procedure: LAPAROSCOPIC OVARIAN CYSTECTOMY;  Surgeon: Richardean ChimeraJohn McComb, MD;  [facility];  Service: Gynecology;  Laterality: Left;  . LAPAROSCOPY N/A 01/08/2015   Procedure: LAPAROSCOPY DIAGNOSTIC;  Surgeon: Richardean ChimeraJohn McComb, MD;  [facility] [place];  Service: Gynecology;  Laterality: N/A;  . TONSILLECTOMY  age 26   Social History   Tobacco Use  . Smoking status: Never Smoker  . Smokeless tobacco: Never Used  Substance Use Topics  . Alcohol use: Yes    Comment: occasional   family history includes Alcohol  abuse in her mother; Cancer in her maternal grandfather, maternal grandmother, and paternal grandfather; Diabetes in her maternal grandfather and sister; Hypertension in her maternal grandfather and paternal grandmother.  ROS as above:  Medications: Current Outpatient Medications  Medication Sig Dispense Refill  . cloNIDine (CATAPRES) 0.2 MG tablet TAKE 1 TABLET(0.2 MG) BY MOUTH AT BEDTIME 30 tablet 0  . diazepam (VALIUM) 5 MG tablet Place 1 tablet in vagina the night prior to PT 15 tablet 0  . Eluxadoline (VIBERZI) 100 MG TABS Take 1 tablet (100 mg total) 2 (two) times daily by mouth. 60 tablet 3  . estradiol (CLIMARA - DOSED IN MG/24 HR) 0.05 mg/24hr patch Place onto the skin.    Marland Kitchen. sertraline (ZOLOFT) 25 MG tablet Take 1 pill po daily for 1 week then increase to 2 pills po daily 30 tablet 1  . clonazePAM (KLONOPIN) 0.5 MG tablet Take 1 tablet (0.5 mg total) by mouth 2 (two) times daily as needed for anxiety (severe anxiety). 30 tablet 0   No current facility-administered medications for this visit.    Allergies  Allergen Reactions  . Buprenorphine Nausea Only    Health Maintenance Health Maintenance  Topic Date Due  . INFLUENZA VACCINE  05/17/2018 (Originally 04/26/2017)  . TETANUS/TDAP  05/26/2018 (Originally 06/21/2009)  . PAP SMEAR  09/13/2018  . HIV Screening  Completed     Exam:  BP (!) 142/82   Pulse 99   Ht 5\' 4"  (1.626 m)   Wt 138 lb (62.6 kg)  LMP 12/11/2014 (Approximate)   BMI 23.69 kg/m  Gen: Well NAD HEENT: EOMI,  MMM Lungs: Normal work of breathing. CTABL Heart: RRR no MRG Abd: NABS, Soft. Nondistended, Nontender Exts: Brisk capillary refill, warm and well perfused.  Psych: Alert and oriented tearful affect.  No SI or HI expressed.   No results found for this or any previous visit (from the past 72 hour(s)). No results found.    Assessment and Plan: 26 y.o. female with worsening anxiety.  Plan to continue zoloft taper and add BID klonopin. Recheck in  1 week as scheduled.  Return sooner if needed.   Lengthy discussion about safety and addiction risk of klonopin.     No orders of the defined types were placed in this encounter.  Meds ordered this encounter  Medications  . clonazePAM (KLONOPIN) 0.5 MG tablet    Sig: Take 1 tablet (0.5 mg total) by mouth 2 (two) times daily as needed for anxiety (severe anxiety).    Dispense:  30 tablet    Refill:  0     Discussed warning signs or symptoms. Please see discharge instructions. Patient expresses understanding.  I spent 25 minutes with this patient, greater than 50% was face-to-face time counseling regarding ddx and treatment plan.

## 2017-09-21 NOTE — Patient Instructions (Addendum)
Thank you for coming in today. Continue the zoloft taper up.  Add Klonopin for 1 month.  Take it twice daily.  Make sure you dont get sleepy or have problems driving with this medicine.  Follow up on the 4th as scheduled.   As for your mom I do think making a Palliative care family meeting to discuss goals of care is a good idea.  It is ok to focus on comfort if she does not have a good chance of getting better.

## 2017-09-24 ENCOUNTER — Other Ambulatory Visit: Payer: Self-pay | Admitting: Family Medicine

## 2017-09-26 DIAGNOSIS — N809 Endometriosis, unspecified: Secondary | ICD-10-CM | POA: Diagnosis not present

## 2017-09-29 ENCOUNTER — Ambulatory Visit (INDEPENDENT_AMBULATORY_CARE_PROVIDER_SITE_OTHER): Payer: 59 | Admitting: Family Medicine

## 2017-09-29 ENCOUNTER — Encounter: Payer: Self-pay | Admitting: Family Medicine

## 2017-09-29 VITALS — BP 108/74 | HR 75 | Wt 140.0 lb

## 2017-09-29 DIAGNOSIS — F411 Generalized anxiety disorder: Secondary | ICD-10-CM

## 2017-09-29 DIAGNOSIS — F331 Major depressive disorder, recurrent, moderate: Secondary | ICD-10-CM

## 2017-09-29 DIAGNOSIS — K589 Irritable bowel syndrome without diarrhea: Secondary | ICD-10-CM

## 2017-09-29 MED ORDER — ALPRAZOLAM 0.25 MG PO TABS
0.2500 mg | ORAL_TABLET | Freq: Two times a day (BID) | ORAL | 0 refills | Status: DC | PRN
Start: 2017-09-29 — End: 2017-10-26

## 2017-09-29 MED ORDER — FLUOXETINE HCL 10 MG PO CAPS: 10.0000 mg | ORAL_CAPSULE | Freq: Every day | ORAL | 1 refills | Status: DC

## 2017-09-29 NOTE — Progress Notes (Signed)
Amanda Vazquez is a 27 y.o. female who presents to [facility]: Primary Care Sports Medicine today for follow-up anxiety.  Amanda Vazquez has not done well in the last week or so with her anxiety.  Her mother is worsening and still on the hospital.  She is considering moving to comfort measures.  She finds these medical decisions to be very challenging.  She gets lots of support with her husband but still has family dynamics that make this decision challenging.  Additionally she has had trouble with both Zoloft and the Klonopin that was prescribed. She has a history of IBS and notes that since she started taking Zoloft she has had significant abdominal cramping and discomfort.  She is not sure if it is the worsening anxiety or the Zoloft is causing this.  Additionally she has trouble tolerating Klonopin.  She notes it causes significant fatigue and makes her feel like "a zombie".  She has tried cutting the dose in half and into quarters and still has significant side effects.  When she does not take it she gets severe panic attacks.   Past Medical History:  Diagnosis Date  . Common migraine with intractable migraine 12/16/2016  . Endometriosis   . Frequency of urination   . Migraine   . Pelvic pain in female   . Polycystic ovary disease    Past Surgical History:  Procedure Laterality Date  . ABDOMINAL HYSTERECTOMY    . DX LAPAROSCOPY W/ LASER ABLATION OF ENDOMETRIOSIS  06-12-2014   [facility]  . LAPAROSCOPIC OVARIAN CYSTECTOMY Left 01/08/2015   Procedure: LAPAROSCOPIC OVARIAN CYSTECTOMY;  Surgeon: Richardean Chimera, MD;  Location: [facility] [place];  Service: Gynecology;  Laterality: Left;  . LAPAROSCOPY N/A 01/08/2015   Procedure: LAPAROSCOPY DIAGNOSTIC;  Surgeon: Richardean Chimera, MD;  Location: [facility];  Service: Gynecology;  Laterality: N/A;  . TONSILLECTOMY  age 89    Social History   Tobacco Use  . Smoking status: Never Smoker  . Smokeless tobacco: Never Used  Substance Use Topics  . Alcohol use: Yes    Comment: occasional   family history includes Alcohol abuse in her mother; Cancer in her maternal grandfather, maternal grandmother, and paternal grandfather; Diabetes in her maternal grandfather and sister; Hypertension in her maternal grandfather and paternal grandmother.  ROS as above:  Medications: Current Outpatient Medications  Medication Sig Dispense Refill  . cloNIDine (CATAPRES) 0.2 MG tablet TAKE 1 TABLET(0.2 MG) BY MOUTH AT BEDTIME 30 tablet 0  . diazepam (VALIUM) 5 MG tablet Place 1 tablet in vagina the night prior to PT 15 tablet 0  . Eluxadoline (VIBERZI) 100 MG TABS Take 1 tablet (100 mg total) 2 (two) times daily by mouth. 60 tablet 3  . estradiol (CLIMARA - DOSED IN MG/24 HR) 0.05 mg/24hr patch Place onto the skin.    Marland Kitchen ALPRAZolam (XANAX) 0.25 MG tablet Take 1 tablet (0.25 mg total) by mouth 2 (two) times daily as needed for anxiety. 28 tablet 0  . FLUoxetine (PROZAC) 10 MG capsule Take 1 capsule (10 mg total) by mouth daily. 30 capsule 1   No current facility-administered medications for this visit.    Allergies  Allergen Reactions  . Buprenorphine Nausea Only    Health Maintenance Health Maintenance  Topic Date Due  . INFLUENZA VACCINE  05/17/2018 (Originally 04/26/2017)  . TETANUS/TDAP  05/26/2018 (Originally 06/21/2009)  . PAP SMEAR  09/13/2018  . HIV Screening  Completed  Exam:  BP 108/74   Pulse 75   Wt 140 lb (63.5 kg)   LMP 12/11/2014 (Approximate)   BMI 24.03 kg/m  Gen: Well NAD HEENT: EOMI,  MMM Lungs: Normal work of breathing. CTABL Heart: RRR no MRG Abd: NABS, Soft. Nondistended, Nontender Exts: Brisk capillary refill, warm and well perfused.  Psych: Alert and oriented tearful affect.  No SI or HI expressed.  Speech and thought process are normal.  No results found for this or any previous  visit (from the past 72 hour(s)). No results found.    Assessment and Plan: 27 y.o. female with  Anxiety/Depression: Certainly not ideally controlled.  I think is reasonable to try switching things around here.  We will try switching to Prozac as this may be better tolerated.  Additionally will stop Klonopin and use limited amounts of Xanax as this may also be better tolerated.  Recheck in 1-2 weeks and return sooner if needed.  IBS: Typically pretty well controlled.  Continue Viberzi.  Try Prozac.  Recheck in 2 weeks.   No orders of the defined types were placed in this encounter.  Meds ordered this encounter  Medications  . ALPRAZolam (XANAX) 0.25 MG tablet    Sig: Take 1 tablet (0.25 mg total) by mouth 2 (two) times daily as needed for anxiety.    Dispense:  28 tablet    Refill:  0  . FLUoxetine (PROZAC) 10 MG capsule    Sig: Take 1 capsule (10 mg total) by mouth daily.    Dispense:  30 capsule    Refill:  1     Discussed warning signs or symptoms. Please see discharge instructions. Patient expresses understanding.

## 2017-09-29 NOTE — Patient Instructions (Addendum)
Thank you for coming in today. STOP zoloft STOP klonopin   Start Prozac (fluoxatine) 10mg  daily.  If you have stomach issues with this medicine cut it in half for 1 week.   Start limited xanax.   Recheck in 2 weeks.   Recheck sooner if not doing well.    If you just need to talk about you mom or how your mom is doing let me know through a mychart message and I will set aside some time to call you.

## 2017-10-02 ENCOUNTER — Ambulatory Visit (HOSPITAL_COMMUNITY): Payer: 59 | Admitting: Licensed Clinical Social Worker

## 2017-10-06 DIAGNOSIS — R102 Pelvic and perineal pain: Secondary | ICD-10-CM | POA: Diagnosis not present

## 2017-10-06 DIAGNOSIS — N809 Endometriosis, unspecified: Secondary | ICD-10-CM | POA: Diagnosis not present

## 2017-10-09 ENCOUNTER — Encounter: Payer: Self-pay | Admitting: Family Medicine

## 2017-10-09 ENCOUNTER — Encounter (HOSPITAL_COMMUNITY): Payer: Self-pay | Admitting: Licensed Clinical Social Worker

## 2017-10-09 ENCOUNTER — Ambulatory Visit (INDEPENDENT_AMBULATORY_CARE_PROVIDER_SITE_OTHER): Payer: 59

## 2017-10-09 ENCOUNTER — Ambulatory Visit (INDEPENDENT_AMBULATORY_CARE_PROVIDER_SITE_OTHER): Payer: 59 | Admitting: Family Medicine

## 2017-10-09 VITALS — BP 96/64 | HR 66 | Wt 142.0 lb

## 2017-10-09 DIAGNOSIS — R102 Pelvic and perineal pain: Secondary | ICD-10-CM | POA: Diagnosis not present

## 2017-10-09 DIAGNOSIS — K5900 Constipation, unspecified: Secondary | ICD-10-CM | POA: Diagnosis not present

## 2017-10-09 DIAGNOSIS — G8929 Other chronic pain: Secondary | ICD-10-CM

## 2017-10-09 DIAGNOSIS — K589 Irritable bowel syndrome without diarrhea: Secondary | ICD-10-CM | POA: Diagnosis not present

## 2017-10-09 DIAGNOSIS — F411 Generalized anxiety disorder: Secondary | ICD-10-CM

## 2017-10-09 DIAGNOSIS — F331 Major depressive disorder, recurrent, moderate: Secondary | ICD-10-CM

## 2017-10-09 NOTE — Progress Notes (Signed)
Amanda Vazquez is a 27 y.o. female who [facility] today for follow-up of chronic abdominal pain and anxiety.  Anxiety: Amanda Vazquez notes continued anxiety symptoms. Her mother remains severely ill hospital.  The medical staff is discussing transfer to a LTAC. She notes that her anxiety is still bad. She is tolerating prozac and xanax. She notes that the xanax does cause fatigue.   She however notes worsening abdominal pain. She has a history of chronic abdominal pain that has been difficult to control in the past. This was complicated by opiate dependency following management of her pain with oral opiates. She was controlled with Viberzi pretty well until recently. However the pain has been worsening over the last week or so. She notes pelvic pain without diarrhea, constipation or urinary frequency or diarrhea.  She had a bowel movement this morning and notes that she passed gas per rectum today.  She has a follow up appointment with OBGYN in a few weeks and pelvic PT next week. She was seen in urgent care recently where she was prescribed opiates. She notes that this has controlled her pain and allowed her to function better.    Past Medical History:  Diagnosis Date  . Common migraine with intractable migraine 12/16/2016  . Endometriosis   . Frequency of urination   . Migraine   . Pelvic pain in female   . Polycystic ovary disease    Past Surgical History:  Procedure Laterality Date  . ABDOMINAL HYSTERECTOMY    . DX LAPAROSCOPY W/ LASER ABLATION OF ENDOMETRIOSIS  06-12-2014   [facility]  . LAPAROSCOPIC OVARIAN CYSTECTOMY Left 01/08/2015   Procedure: LAPAROSCOPIC OVARIAN CYSTECTOMY;  Surgeon: Amanda ChimeraJohn McComb, MD;  [facility];  Service: Gynecology;  Laterality: Left;  . LAPAROSCOPY N/A 01/08/2015   Procedure: LAPAROSCOPY  DIAGNOSTIC;  Surgeon: Amanda ChimeraJohn McComb, MD;  [facility] [place];  Service: Gynecology;  Laterality: N/A;  . TONSILLECTOMY  age 89   Social History   Tobacco Use  . Smoking status: Never Smoker  . Smokeless tobacco: Never Used  Substance Use Topics  . Alcohol use: Yes    Comment: occasional   family history includes Alcohol abuse in her mother; Cancer in her maternal grandfather, maternal grandmother, and paternal grandfather; Diabetes in her maternal grandfather and sister; Hypertension in her maternal grandfather and paternal grandmother.  ROS as above:  Medications: Current Outpatient Medications  Medication Sig Dispense Refill  . ALPRAZolam (XANAX) 0.25 MG tablet Take 1 tablet (0.25 mg total) by mouth 2 (two) times daily as needed for anxiety. 28 tablet 0  . cloNIDine (CATAPRES) 0.2 MG tablet TAKE 1 TABLET(0.2 MG) BY MOUTH AT BEDTIME 30 tablet 0  . diazepam (VALIUM) 5 MG tablet Place 1 tablet in vagina the night prior to PT 15 tablet 0  . Eluxadoline (VIBERZI) 100 MG TABS Take 1 tablet (100 mg total) 2 (two) times daily by mouth. 60 tablet 3  . estradiol (CLIMARA - DOSED IN MG/24 HR) 0.05 mg/24hr patch Place onto the skin.    Marland Kitchen. FLUoxetine (PROZAC) 10 MG capsule Take 1 capsule (10 mg total) by mouth daily. 30 capsule 1  . promethazine (PHENERGAN) 25 MG tablet   3   No current facility-administered medications for this visit.    Allergies  Allergen Reactions  . Buprenorphine Nausea Only    Health Maintenance Health Maintenance  Topic Date Due  .  INFLUENZA VACCINE  05/17/2018 (Originally 04/26/2017)  . TETANUS/TDAP  05/26/2018 (Originally 06/21/2009)  . PAP SMEAR  09/13/2018  . HIV Screening  Completed     Exam:  BP 96/64   Pulse 66   Wt 142 lb (64.4 kg)   LMP 12/11/2014 (Approximate)   BMI 24.37 kg/m  Gen: Well NAD HEENT: EOMI,  MMM Lungs: Normal work of breathing. CTABL Heart: RRR no MRG Abd: NABS, Soft. Nondistended, Mild TTP lower central pelvis.    Exts: Brisk capillary refill, warm and well perfused.    No results found for this or any previous visit (from the past 72 hour(s)). Dg Abd 1 View  Result Date: 10/09/2017 CLINICAL DATA:  Chronic pelvic pain and constipation EXAM: ABDOMEN - 1 VIEW COMPARISON:  CT abdomen pelvis 03/28/2016 FINDINGS: Visible lung bases are clear. Nonobstructed gas pattern with large amount of stool throughout the colon and rectum. No abnormal calcification. IMPRESSION: Nonobstructed gas pattern with large amount of stool throughout the colon Electronically Signed   By: Jasmine Pang M.D.   On: 10/09/2017 16:12      Assessment and Plan: 27 y.o. female with  Anxiety: Stable continue current regimen recheck in a few weeks.  Abdominal pain: Not doing well.    Cause of  Abdominal Pain: This seems to me to be an exacerbation of her chronic underlying pain.  However we do need to evaluate for potential life-threatening etiology.  X-ray today does not show a pattern consistent with partial small bowel obstruction.  Additionally we have urine labs and CBC and metabolic panel pending now which should help evaluate for potential life-threatening cause of pain.   Management of Pain: As I think this is an exacerbation of her underlying pain thought to be related to IBS and pelvic muscle dysfunction think we can manage it is we have been.  I do not think that opiates are the right answer here.  She has had problems with dependency in the past I am concerned that opiates that we prescribed in urgent care are unlikely to help much.  I think continuing pelvic PT and follow-up with OB/GYN is reasonable. If this pain becomes chronic I think referral to pain management is reasonable.  Check in a few weeks.  Orders Placed This Encounter  Procedures  . Urine Culture  . DG Abd 1 View    Order Specific Question:   Reason for exam:    Answer:   Assess stones.    Order Specific Question:   Is the patient pregnant?    Answer:    No    Order Specific Question:   Preferred imaging location?    Answer:   [facility]  . CBC with Differential/Platelet  . COMPLETE METABOLIC PANEL WITH GFR  . Ambulatory referral to Pain Clinic    Referral Priority:   Routine    Referral Type:   Consultation    Referral Reason:   Specialty Services Required    Requested Specialty:   Pain Medicine    Number of Visits Requested:   1   No orders of the defined types were placed in this encounter.    Discussed warning signs or symptoms. Please see discharge instructions. Patient expresses understanding.  I spent 40 minutes with this patient, greater than 50% was face-to-face time counseling regarding ddx and treatment plan.

## 2017-10-09 NOTE — Patient Instructions (Addendum)
Thank you for coming in today. Get urine lab, blood lab and xray today.  Attend Pelvic PT and OBGYN follow up.  Recheck with me in 2-4 weeks.  Return sooner if needed.

## 2017-10-10 LAB — COMPLETE METABOLIC PANEL WITH GFR
AG RATIO: 1.8 (calc) (ref 1.0–2.5)
ALBUMIN MSPROF: 4.8 g/dL (ref 3.6–5.1)
ALKALINE PHOSPHATASE (APISO): 57 U/L (ref 33–115)
ALT: 14 U/L (ref 6–29)
AST: 15 U/L (ref 10–30)
BUN: 11 mg/dL (ref 7–25)
CO2: 35 mmol/L — AB (ref 20–32)
CREATININE: 0.7 mg/dL (ref 0.50–1.10)
Calcium: 10.3 mg/dL — ABNORMAL HIGH (ref 8.6–10.2)
Chloride: 101 mmol/L (ref 98–110)
GFR, EST NON AFRICAN AMERICAN: 119 mL/min/{1.73_m2} (ref >=60)
GFR, Est African American: 138 mL/min/{1.73_m2} (ref >=60)
GLOBULIN: 2.6 g/dL (ref 1.9–3.7)
Glucose, Bld: 81 mg/dL (ref 65–99)
Potassium: 5 mmol/L (ref 3.5–5.3)
Sodium: 140 mmol/L (ref 135–146)
Total Bilirubin: 0.3 mg/dL (ref 0.2–1.2)
Total Protein: 7.4 g/dL (ref 6.1–8.1)

## 2017-10-10 LAB — CBC WITH DIFFERENTIAL/PLATELET
BASOS ABS: 32 {cells}/uL (ref 0–200)
Basophils Relative: 0.5 %
EOS ABS: 70 {cells}/uL (ref 15–500)
Eosinophils Relative: 1.1 %
HCT: 38.6 % (ref 35.0–45.0)
Hemoglobin: 13 g/dL (ref 11.7–15.5)
Lymphs Abs: 2662 cells/uL (ref 850–3900)
MCH: 29.1 pg (ref 27.0–33.0)
MCHC: 33.7 g/dL (ref 32.0–36.0)
MCV: 86.4 fL (ref 80.0–100.0)
MONOS PCT: 6.9 %
MPV: 9.9 fL (ref 7.5–12.5)
NEUTROS PCT: 49.9 %
Neutro Abs: 3194 cells/uL (ref 1500–7800)
PLATELETS: 303 10*3/uL (ref 140–400)
RBC: 4.47 10*6/uL (ref 3.80–5.10)
RDW: 12.8 % (ref 11.0–15.0)
TOTAL LYMPHOCYTE: 41.6 %
WBC mixed population: 442 cells/uL (ref 200–950)
WBC: 6.4 10*3/uL (ref 3.8–10.8)

## 2017-10-10 LAB — URINE CULTURE
MICRO NUMBER: 90055086
SPECIMEN QUALITY:: ADEQUATE

## 2017-10-12 ENCOUNTER — Telehealth: Payer: Self-pay | Admitting: Family Medicine

## 2017-10-12 DIAGNOSIS — K580 Irritable bowel syndrome with diarrhea: Secondary | ICD-10-CM

## 2017-10-12 MED ORDER — RIFAXIMIN 550 MG PO TABS: 550.0000 mg | ORAL_TABLET | Freq: Three times a day (TID) | ORAL | 0 refills | Status: AC

## 2017-10-12 NOTE — Telephone Encounter (Signed)
I did some thinking about other options for symptoms.  We can use a medicine for IBS that we have not yet.  One of the causes of cramping and pain in the intestines could be because the gut bacteria has gotten out of control.  Xifaxan works differently to change the bacteria in the gut to cause less gut inflammation, pain and cramping.  It is a 2 week course that typically works for months. Continue viberzi. Coupon card is left in the office for pick up.

## 2017-10-12 NOTE — Telephone Encounter (Signed)
Spoke to patient gave her advise as noted below. Rhonda Cunningham,CMA. She stated that insured denied the medication she will come by the office and pick up the card. Rhonda Cunningham,CMA . Rhonda Cunningham,CMA

## 2017-10-13 ENCOUNTER — Ambulatory Visit: Payer: Self-pay | Admitting: Family Medicine

## 2017-10-18 ENCOUNTER — Other Ambulatory Visit: Payer: Self-pay | Admitting: Family Medicine

## 2017-10-19 ENCOUNTER — Other Ambulatory Visit: Payer: Self-pay | Admitting: Family Medicine

## 2017-10-19 ENCOUNTER — Ambulatory Visit (HOSPITAL_COMMUNITY): Payer: 59 | Admitting: Licensed Clinical Social Worker

## 2017-10-24 ENCOUNTER — Other Ambulatory Visit: Payer: Self-pay | Admitting: Family Medicine

## 2017-10-26 ENCOUNTER — Telehealth: Payer: Self-pay | Admitting: Family Medicine

## 2017-10-26 ENCOUNTER — Encounter: Payer: Self-pay | Admitting: Family Medicine

## 2017-10-26 ENCOUNTER — Ambulatory Visit (INDEPENDENT_AMBULATORY_CARE_PROVIDER_SITE_OTHER): Payer: 59 | Admitting: Family Medicine

## 2017-10-26 VITALS — BP 123/80 | HR 64 | Wt 150.0 lb

## 2017-10-26 DIAGNOSIS — R102 Pelvic and perineal pain: Secondary | ICD-10-CM

## 2017-10-26 DIAGNOSIS — F411 Generalized anxiety disorder: Secondary | ICD-10-CM | POA: Diagnosis not present

## 2017-10-26 DIAGNOSIS — G8929 Other chronic pain: Secondary | ICD-10-CM

## 2017-10-26 DIAGNOSIS — K582 Mixed irritable bowel syndrome: Secondary | ICD-10-CM

## 2017-10-26 MED ORDER — ALPRAZOLAM 0.25 MG PO TABS: 0.2500 mg | ORAL_TABLET | Freq: Two times a day (BID) | ORAL | 0 refills | Status: DC | PRN

## 2017-10-26 MED ORDER — BISACODYL 10 MG RE SUPP: 10.0000 mg | RECTAL | 0 refills | Status: DC | PRN

## 2017-10-26 NOTE — Patient Instructions (Addendum)
Thank you for coming in today. Continue miralax and Viberzi.  Add Ducolax suppository as needed.  Increase miralax to twice daily.  Recheck with me in 1 month or sooner if needed.  Continue prozac and xanax.   I recommend trying to have a family meeting and palliate consult with your mom's treatment team.

## 2017-10-26 NOTE — Progress Notes (Signed)
Amanda Vazquez is a 27 y.o. female who presents to [facility]: [facility] today for anxiety and abdominal pain.   Anxiety: Amanda Vazquez notes continued worsening anxiety.  Her mother remains critically ill in the hospital and has had setbacks recently with elevated troponins.  The family still trying to figure out if they will continue intensive care or comfort measures.  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  She continues Prozac and uses Xanax 1-2x daily.  She has had to reschedule counseling and has not yet been able to go.    Abdominal pain thought to be due to IBS.  This is stable to improved.  She notes pain is occasionally controlled on Viberzi.  She continues to be somewhat constipated and having difficulties having a good bowel movement.  She continues MiraLAX which she does find helpful.  She has used an over-the-counter suppository which helped as well.  She has a follow-up appointment scheduled with gastroenterology in the near future.  She is able to pass gas and eat.   Past Medical History:  Diagnosis Date  . Common migraine with intractable migraine 12/16/2016  . Endometriosis   . Frequency of urination   . Migraine   . Pelvic pain in female   . Polycystic ovary disease    Past Surgical History:  Procedure Laterality Date  . ABDOMINAL HYSTERECTOMY    . DX LAPAROSCOPY W/ LASER ABLATION OF ENDOMETRIOSIS  06-12-2014   [facility]  . LAPAROSCOPIC OVARIAN CYSTECTOMY Left 01/08/2015   Procedure: LAPAROSCOPIC OVARIAN CYSTECTOMY;  Surgeon: Richardean Chimera, MD;  Location[facility];  Service: Gynecology;  Laterality: Left;  . LAPAROSCOPY N/A 01/08/2015   Procedure: LAPAROSCOPY DIAGNOSTIC;  Surgeon: Richardean Chimera, MD;  Location[facility];  Service: Gynecology;  Laterality: N/A;  . TONSILLECTOMY  age 36    Social History   Tobacco Use  . Smoking status: Never Smoker  . Smokeless tobacco: Never Used  Substance Use Topics  . Alcohol use: Yes    Comment: occasional   family history includes Alcohol abuse in her mother; Cancer in her maternal grandfather, maternal grandmother, and paternal grandfather; Diabetes in her maternal grandfather and sister; Hypertension in her maternal grandfather and paternal grandmother.  ROS as above:  Medications: Current Outpatient Medications  Medication Sig Dispense Refill  . ALPRAZolam (XANAX) 0.25 MG tablet Take 1 tablet (0.25 mg total) by mouth 2 (two) times daily as needed for anxiety. 28 tablet 0  . cloNIDine (CATAPRES) 0.2 MG tablet TAKE 1 TABLET(0.2 MG) BY MOUTH AT BEDTIME 30 tablet 0  . diazepam (VALIUM) 5 MG tablet Place 1 tablet in vagina the night prior to PT 15 tablet 0  . Eluxadoline (VIBERZI) 100 MG TABS Take 1 tablet (100 mg total) 2 (two) times daily by mouth. 60 tablet 3  . estradiol (CLIMARA - DOSED IN MG/24 HR) 0.05 mg/24hr patch Place onto the skin.    Marland Kitchen FLUoxetine (PROZAC) 10 MG capsule Take 1 capsule (10 mg total) by mouth daily. 30 capsule 1  . promethazine (PHENERGAN) 25 MG tablet   3  . rifaximin (XIFAXAN) 550 MG TABS tablet Take 1 tablet (550 mg total) by mouth 3 (three) times daily for 14 days. 42 tablet 0   No current facility-administered medications for this visit.    Allergies  Allergen Reactions  . Buprenorphine Nausea Only    Health Maintenance Health  Maintenance  Topic Date Due  . INFLUENZA VACCINE  05/17/2018 (Originally 04/26/2017)  . TETANUS/TDAP  05/26/2018 (Originally 06/21/2009)  . PAP SMEAR  09/13/2018  . HIV Screening  Completed     Exam:  BP 123/80   Pulse 64   Wt 150 lb (68 kg)   LMP 12/11/2014 (Approximate)   BMI 25.75 kg/m  Gen: Well NAD HEENT: EOMI,  MMM Lungs: Normal work of breathing. CTABL Heart: RRR no MRG Abd: NABS, Soft. Nondistended, mildly tender to palpation with no rebound or  guarding. Exts: Brisk capillary refill, warm and well perfused.  Psych: Alert and oriented normal speech thought process and affect.  No SI or HI expressed.  Depression screen [facility] 2/9 10/26/2017 07/19/2017 06/21/2017 06/07/2017 11/14/2016  Decreased Interest 2 3 2 2 2   Down, Depressed, Hopeless 3 3 2 2 3   PHQ - 2 Score 5 6 4 4 5   Altered sleeping 2 3 3 2 3   Tired, decreased energy 2 1 3 3 3   Change in appetite 3 3 3 3 3   Feeling bad or failure about yourself  2 1 2 1 2   Trouble concentrating 3 3 3 2 1   Moving slowly or fidgety/restless 3 1 3 3  0  Suicidal thoughts 0 0 0 0 0  PHQ-9 Score 20 18 21 18 17   Difficult doing work/chores - Very difficult - - -  Some encounter information is confidential and restricted. Go to Review Flowsheets activity to see all data.   GAD 7 : Generalized Anxiety Score 10/26/2017 09/15/2017 07/19/2017 06/21/2017  Nervous, Anxious, on Edge 3 3 3 3   Control/stop worrying 3 3 3 3   Worry too much - different things 3 3 3 3   Trouble relaxing 3 3 3 3   Restless 3 3 3 3   Easily annoyed or irritable 3 3 3 3   Afraid - awful might happen 3 3 3 3   Total GAD 7 Score 21 21 21 21   Anxiety Difficulty Extremely difficult Very difficult Very difficult -  Some encounter information is confidential and restricted. Go to Review Flowsheets activity to see all data.       No results found for this or any previous visit (from the past 72 hour(s)). No results found.    Assessment and Plan: 27 y.o. female with  Anxiety: Not well controlled.  Continue counseling continue current regimen recheck in the near future.  I do not think her symptoms are can get much better until either her mom gets better or moved to comfort measures.  Abdominal pain/IBS/constipation: Chronic.  Continue current regimen and add Dulcolax suppositories and follow-up with gastroenterology.  Increase MiraLAX to twice daily. Recheck in about a month.    No orders of the defined types were placed in this  encounter.  No orders of the defined types were placed in this encounter.    Discussed warning signs or symptoms. Please see discharge instructions. Patient expresses understanding.

## 2017-10-26 NOTE — Telephone Encounter (Signed)
Dr. Corey please see note below. Amanda Vazquez,CMA  

## 2017-10-27 NOTE — Telephone Encounter (Signed)
Left message on patient vm to call us back in regards to the concerns she had during her visit. Rhonda Cunningham,CMA

## 2017-10-27 NOTE — Telephone Encounter (Signed)
Please inquire the question she has

## 2017-10-31 ENCOUNTER — Ambulatory Visit: Payer: Self-pay | Admitting: Family Medicine

## 2017-11-01 DIAGNOSIS — N941 Unspecified dyspareunia: Secondary | ICD-10-CM | POA: Diagnosis not present

## 2017-11-01 DIAGNOSIS — R102 Pelvic and perineal pain: Secondary | ICD-10-CM | POA: Diagnosis not present

## 2017-11-01 DIAGNOSIS — M7918 Myalgia, other site: Secondary | ICD-10-CM | POA: Diagnosis not present

## 2017-11-03 DIAGNOSIS — R102 Pelvic and perineal pain: Secondary | ICD-10-CM | POA: Diagnosis not present

## 2017-11-07 DIAGNOSIS — R102 Pelvic and perineal pain: Secondary | ICD-10-CM | POA: Diagnosis not present

## 2017-11-16 ENCOUNTER — Other Ambulatory Visit: Payer: Self-pay | Admitting: Osteopathic Medicine

## 2017-11-16 ENCOUNTER — Other Ambulatory Visit: Payer: Self-pay | Admitting: Family Medicine

## 2017-11-16 NOTE — Telephone Encounter (Signed)
[facility] pharmacy requesting medication refill.

## 2017-11-21 ENCOUNTER — Ambulatory Visit: Payer: Self-pay | Admitting: Family Medicine

## 2017-11-21 DIAGNOSIS — Z0189 Encounter for other specified special examinations: Secondary | ICD-10-CM

## 2017-11-23 ENCOUNTER — Other Ambulatory Visit: Payer: Self-pay | Admitting: Family Medicine

## 2017-11-29 ENCOUNTER — Telehealth: Payer: Self-pay | Admitting: Family Medicine

## 2017-11-29 DIAGNOSIS — N941 Unspecified dyspareunia: Secondary | ICD-10-CM | POA: Diagnosis not present

## 2017-11-29 DIAGNOSIS — R102 Pelvic and perineal pain: Secondary | ICD-10-CM | POA: Diagnosis not present

## 2017-11-29 MED ORDER — DIAZEPAM 5 MG PO TABS: ORAL_TABLET | ORAL | 0 refills | Status: DC

## 2017-11-29 NOTE — Telephone Encounter (Signed)
Pt starts pelvic PT again this week. Requesting refill. Will route.

## 2017-11-29 NOTE — Telephone Encounter (Signed)
Rx sent 

## 2017-11-29 NOTE — Telephone Encounter (Signed)
Pt advised.

## 2017-11-30 ENCOUNTER — Ambulatory Visit (INDEPENDENT_AMBULATORY_CARE_PROVIDER_SITE_OTHER): Payer: 59 | Admitting: Family Medicine

## 2017-11-30 ENCOUNTER — Encounter: Payer: Self-pay | Admitting: Family Medicine

## 2017-11-30 VITALS — BP 95/62 | HR 62 | Ht 64.0 in | Wt 156.0 lb

## 2017-11-30 DIAGNOSIS — G894 Chronic pain syndrome: Secondary | ICD-10-CM | POA: Diagnosis not present

## 2017-11-30 DIAGNOSIS — F411 Generalized anxiety disorder: Secondary | ICD-10-CM | POA: Diagnosis not present

## 2017-11-30 DIAGNOSIS — G8929 Other chronic pain: Secondary | ICD-10-CM

## 2017-11-30 DIAGNOSIS — F331 Major depressive disorder, recurrent, moderate: Secondary | ICD-10-CM

## 2017-11-30 DIAGNOSIS — R102 Pelvic and perineal pain: Secondary | ICD-10-CM

## 2017-11-30 MED ORDER — FLUOXETINE HCL 20 MG PO CAPS: 20.0000 mg | ORAL_CAPSULE | Freq: Every day | ORAL | 1 refills | Status: DC

## 2017-11-30 MED ORDER — ALPRAZOLAM 0.25 MG PO TABS: ORAL_TABLET | ORAL | 0 refills | Status: DC

## 2017-11-30 NOTE — Patient Instructions (Addendum)
Thank you for coming in today. Continue current treatment.  Increase prozac to 20mg  daily for cognition.  Continue xanax but try to wean.  Recheck monthly if all is well.   If you have a crisis let me know.   Vortioxetine oral tablet What is this medicine? Vortioxetine (vor tee [employer] e teen) is used to treat depression. This medicine may be used for other purposes; ask your health care provider or pharmacist if you have questions. COMMON BRAND NAME(S): BRINTELLIX, Trintellix What should I tell my health care provider before I take this medicine? They need to know if you have any of these conditions: -bipolar disorder or a family history of bipolar disorder -bleeding disorders -drink alcohol -glaucoma -liver disease -low levels of sodium in the blood -seizures -suicidal thoughts, plans, or attempt; a previous suicide attempt by you or a family member -take medicines that treat or prevent blood clots -an unusual or allergic reaction to vortioxetine, other medicines, foods, dyes, or preservatives -pregnant or trying to get pregnant -breast-feeding How should I use this medicine? Take this medicine by mouth with a glass of water. Follow the directions on the prescription label. You can take it with or without food. If it upsets your stomach, take it with food. Take your medicine at regular intervals. Do not take it more often than directed. Do not stop taking this medicine suddenly except upon the advice of your doctor. Stopping this medicine too quickly may cause serious side effects or your condition may worsen. A special MedGuide will be given to you by the pharmacist with each prescription and refill. Be sure to read this information carefully each time. Talk to your pediatrician regarding the use of this medicine in children. Special care may be needed. Overdosage: If you think you have taken too much of this medicine contact a poison control center or emergency room at once. NOTE: This  medicine is only for you. Do not share this medicine with others. What if I miss a dose? If you miss a dose, take it as soon as you can. If it is almost time for your next dose, take only that dose. Do not take double or extra doses. What may interact with this medicine? Do not take this medicine with any of the following medications: -linezolid -MAOIs like Carbex, Eldepryl, Marplan, Nardil, and Parnate -methylene blue (injected into a vein) This medicine may also interact with the following medications: -alcohol -aspirin and aspirin-like medicines -carbamazepine -certain medicines for depression, anxiety, or psychotic disturbances -certain medicines for migraine headache like almotriptan, eletriptan, frovatriptan, naratriptan, rizatriptan, sumatriptan, zolmitriptan -diuretics -fentanyl -furazolidone -isoniazid -medicines that treat or prevent blood clots like warfarin, enoxaparin, and dalteparin -NSAIDs, medicines for pain and inflammation, like ibuprofen or naproxen -phenytoin -procarbazine -quinidine -rasagiline -rifampin -supplements like St. John's wort, kava kava, valerian -tramadol -tryptophan This list may not describe all possible interactions. Give your health care provider a list of all the medicines, herbs, non-prescription drugs, or dietary supplements you use. Also tell them if you smoke, drink alcohol, or use illegal drugs. Some items may interact with your medicine. What should I watch for while using this medicine? Tell your doctor if your symptoms do not get better or if they get worse. Visit your doctor or health care professional for regular checks on your progress. Because it may take several weeks to see the full effects of this medicine, it is important to continue your treatment as prescribed by your doctor. Patients and their families should watch  out for new or worsening thoughts of suicide or depression. Also watch out for sudden changes in feelings such as  feeling anxious, agitated, panicky, irritable, hostile, aggressive, impulsive, severely restless, overly excited and hyperactive, or not being able to sleep. If this happens, especially at the beginning of treatment or after a change in dose, call your health care professional. Bonita Quin may get drowsy or dizzy. Do not drive, use machinery, or do anything that needs mental alertness until you know how this medicine affects you. Do not stand or sit up quickly, especially if you are an older patient. This reduces the risk of dizzy or fainting spells. Alcohol may interfere with the effect of this medicine. Avoid alcoholic drinks. Your mouth may get dry. Chewing sugarless gum or sucking hard candy, and drinking plenty of water may help. Contact your doctor if the problem does not go away or is severe. What side effects may I notice from receiving this medicine? Side effects that you should report to your doctor or health care professional as soon as possible: -allergic reactions like skin rash, itching or hives, swelling of the face, lips, or tongue -anxious -black, tarry stools -changes in vision -confusion -elevated mood, decreased need for sleep, racing thoughts, impulsive behavior -eye pain -fast, irregular heartbeat -feeling faint or lightheaded, falls -feeling agitated, angry, or irritable -hallucination, loss of contact with reality -loss of balance or coordination -loss of memory -painful or prolonged erections -restlessness, pacing, inability to keep still -seizures -stiff muscles -suicidal thoughts or other mood changes -trouble sleeping -unusual bleeding or bruising -unusually weak or tired -vomiting Side effects that usually do not require medical attention (report to your doctor or health care professional if they continue or are bothersome): -change in appetite or weight -change in sex drive or performance -constipation -dizziness -dry mouth -nausea This list may not describe  all possible side effects. Call your doctor for medical advice about side effects. You may report side effects to FDA at 1-800-FDA-1088. Where should I keep my medicine? Keep out of the reach of children. Store at room temperature between 15 and 30 degrees C (59 and 86 degrees F). Throw away any unused medicine after the expiration date. NOTE: This sheet is a summary. It may not cover all possible information. If you have questions about this medicine, talk to your doctor, pharmacist, or health care provider.  2018 Elsevier/Gold Standard (2016-02-11 16:45:13)

## 2017-12-01 NOTE — Progress Notes (Signed)
Amanda Vazquez is a 27 y.o. female who [facility] today for anxiety, pelvic pain, slowed mentation.  Anxiety: Amanda Vazquez notes a history of anxiety previously exacerbated by her mother's poor health.  Her mother has been hospitalized for over a month and is now at home on hospice. Amanda Vazquez notes that she continues to take prozac 10mg  daily which helps a little. She uses Xanax twice daily occasionally 3x daily which does help. She is getting phone counseling through her insurance.  She finds this to be mildly helpful.  Amanda Vazquez does note that she feels like her thinking is scattered and her mental speed is slowed down. This has been ongoing for a few months. She is not sure if it relates to xanax or not.    Pelvic Pain: Amanda Vazquez continues to have exacerbations of chronic pelvic pain. She will restart pelvic PT on 12/07/17. She received the refill of valium vaginal suppositories. She notes that she stopped the Viberzi as it was not very helpful.     Past Medical History:  Diagnosis Date  . Common migraine with intractable migraine 12/16/2016  . Endometriosis   . Frequency of urination   . Migraine   . Pelvic pain in female   . Polycystic ovary disease    Past Surgical History:  Procedure Laterality Date  . ABDOMINAL HYSTERECTOMY    . DX LAPAROSCOPY W/ LASER ABLATION OF ENDOMETRIOSIS  06-12-2014   [facility]  . LAPAROSCOPIC OVARIAN CYSTECTOMY Left 01/08/2015   Procedure: LAPAROSCOPIC OVARIAN CYSTECTOMY;  Surgeon: Richardean ChimeraJohn McComb, MD;  [facility] [place];  Service: Gynecology;  Laterality: Left;  . LAPAROSCOPY N/A 01/08/2015   Procedure: LAPAROSCOPY DIAGNOSTIC;  Surgeon: Richardean ChimeraJohn McComb, MD;  [facility] [place];  Service: Gynecology;  Laterality: N/A;  . TONSILLECTOMY  age 27   Social History   Tobacco Use  . Smoking status:  Never Smoker  . Smokeless tobacco: Never Used  Substance Use Topics  . Alcohol use: Yes    Comment: occasional   family history includes Alcohol abuse in her mother; Cancer in her maternal grandfather, maternal grandmother, and paternal grandfather; Diabetes in her maternal grandfather and sister; Hypertension in her maternal grandfather and paternal grandmother.  ROS as above:  Medications: Current Outpatient Medications  Medication Sig Dispense Refill  . ALPRAZolam (XANAX) 0.25 MG tablet TAKE 1 TABLET(0.25 MG) BY MOUTH TWICE DAILY AS NEEDED FOR ANXIETY. Take it 3x daily on severe days 60 tablet 0  . bisacodyl (DULCOLAX) 10 MG suppository Place 1 suppository (10 mg total) rectally as needed for moderate constipation. 12 suppository 0  . cloNIDine (CATAPRES) 0.2 MG tablet TAKE 1 TABLET(0.2 MG) BY MOUTH AT BEDTIME 90 tablet 1  . diazepam (VALIUM) 5 MG tablet Place 1 tablet in vagina the night prior to PT 15 tablet 0  . estradiol (CLIMARA - DOSED IN MG/24 HR) 0.05 mg/24hr patch Place onto the skin.    Marland Kitchen. FLUoxetine (PROZAC) 20 MG capsule Take 1 capsule (20 mg total) by mouth daily. 30 capsule 1  . promethazine (PHENERGAN) 25 MG tablet   3  . promethazine (PHENERGAN) 25 MG tablet TAKE 1 TABLET(25 MG) BY MOUTH EVERY 8 HOURS AS NEEDED FOR NAUSEA OR VOMITING 30 tablet 0   No current facility-administered medications for this visit.    Allergies  Allergen Reactions  . Buprenorphine Nausea Only    Health Maintenance Health Maintenance  Topic Date Due  .  INFLUENZA VACCINE  05/17/2018 (Originally 04/26/2017)  . TETANUS/TDAP  05/26/2018 (Originally 06/21/2009)  . PAP SMEAR  09/13/2018  . HIV Screening  Completed     Exam:  BP 95/62   Pulse 62   Ht 5\' 4"  (1.626 m)   Wt 156 lb (70.8 kg)   LMP 12/11/2014 (Approximate)   BMI 26.78 kg/m  Gen: Well NAD HEENT: EOMI,  MMM Lungs: Normal work of breathing. CTABL Heart: RRR no MRG Abd: NABS, Soft. Nondistended, Nontender Exts: Brisk  capillary refill, warm and well perfused.  Psych: Alert and oriented. Normal speech and affect. No SI or HI.   Depression screen [facility] 2/9 11/30/2017 10/26/2017 07/19/2017 06/21/2017 06/07/2017  Decreased Interest 3 2 3 2 2   Down, Depressed, Hopeless 3 3 3 2 2   PHQ - 2 Score 6 5 6 4 4   Altered sleeping 3 2 3 3 2   Tired, decreased energy 3 2 1 3 3   Change in appetite 3 3 3 3 3   Feeling bad or failure about yourself  2 2 1 2 1   Trouble concentrating 3 3 3 3 2   Moving slowly or fidgety/restless 3 3 1 3 3   Suicidal thoughts 0 0 0 0 0  PHQ-9 Score 23 20 18 21 18   Difficult doing work/chores Extremely dIfficult - Very difficult - -  Some encounter information is confidential and restricted. Go to Review Flowsheets activity to see all data.   GAD 7 : Generalized Anxiety Score 11/30/2017 10/26/2017 09/15/2017 07/19/2017  Nervous, Anxious, on Edge 3 3 3 3   Control/stop worrying 3 3 3 3   Worry too much - different things 3 3 3 3   Trouble relaxing 3 3 3 3   Restless 3 3 3 3   Easily annoyed or irritable 3 3 3 3   Afraid - awful might happen 3 3 3 3   Total GAD 7 Score 21 21 21 21   Anxiety Difficulty Extremely difficult Extremely difficult Very difficult Very difficult  Some encounter information is confidential and restricted. Go to Review Flowsheets activity to see all data.      Assessment and Plan: 27 y.o. female with  Anxiety: Continues to be not well controlled. Plan to increase prozac to 20mg  daily and continue xanax. Try to wean xanax if able.  Recheck monthly.   Slowed Mentation: Possibly due to depression vs medication side effects. Plan to increase prozac as above. Consider switching to trintellix in the future. Will try to wean off xanax.   Pelvic pain: Agree with pelvic PT.   Recheck in 1 month.    No orders of the defined types were placed in this encounter.  Meds ordered this encounter  Medications  . FLUoxetine (PROZAC) 20 MG capsule    Sig: Take 1 capsule (20 mg total) by  mouth daily.    Dispense:  30 capsule    Refill:  1  . ALPRAZolam (XANAX) 0.25 MG tablet    Sig: TAKE 1 TABLET(0.25 MG) BY MOUTH TWICE DAILY AS NEEDED FOR ANXIETY. Take it 3x daily on severe days    Dispense:  60 tablet    Refill:  0    Fill on March 10th     Discussed warning signs or symptoms. Please see discharge instructions. Patient expresses understanding.

## 2017-12-06 ENCOUNTER — Other Ambulatory Visit: Payer: Self-pay | Admitting: Family Medicine

## 2017-12-07 DIAGNOSIS — R102 Pelvic and perineal pain: Secondary | ICD-10-CM | POA: Diagnosis not present

## 2017-12-07 DIAGNOSIS — R198 Other specified symptoms and signs involving the digestive system and abdomen: Secondary | ICD-10-CM | POA: Diagnosis not present

## 2017-12-07 DIAGNOSIS — R29898 Other symptoms and signs involving the musculoskeletal system: Secondary | ICD-10-CM | POA: Diagnosis not present

## 2017-12-13 ENCOUNTER — Other Ambulatory Visit: Payer: Self-pay | Admitting: Family Medicine

## 2017-12-22 DIAGNOSIS — N809 Endometriosis, unspecified: Secondary | ICD-10-CM | POA: Diagnosis not present

## 2017-12-22 DIAGNOSIS — R102 Pelvic and perineal pain: Secondary | ICD-10-CM | POA: Diagnosis not present

## 2017-12-22 DIAGNOSIS — R198 Other specified symptoms and signs involving the digestive system and abdomen: Secondary | ICD-10-CM | POA: Diagnosis not present

## 2017-12-27 ENCOUNTER — Other Ambulatory Visit: Payer: Self-pay | Admitting: Family Medicine

## 2018-01-02 ENCOUNTER — Other Ambulatory Visit: Payer: Self-pay | Admitting: Family Medicine

## 2018-01-04 ENCOUNTER — Ambulatory Visit (INDEPENDENT_AMBULATORY_CARE_PROVIDER_SITE_OTHER): Payer: 59 | Admitting: Family Medicine

## 2018-01-04 ENCOUNTER — Encounter: Payer: Self-pay | Admitting: Family Medicine

## 2018-01-04 VITALS — BP 120/79 | HR 76 | Wt 156.8 lb

## 2018-01-04 DIAGNOSIS — R41840 Attention and concentration deficit: Secondary | ICD-10-CM | POA: Diagnosis not present

## 2018-01-04 DIAGNOSIS — F332 Major depressive disorder, recurrent severe without psychotic features: Secondary | ICD-10-CM

## 2018-01-04 DIAGNOSIS — F411 Generalized anxiety disorder: Secondary | ICD-10-CM

## 2018-01-04 MED ORDER — VORTIOXETINE HBR 10 MG PO TABS: 10.0000 mg | ORAL_TABLET | Freq: Every day | ORAL | 1 refills | Status: DC

## 2018-01-04 NOTE — Patient Instructions (Addendum)
Thank you for coming in today. We are switching to trintellix.  STOP prozac tomorrow.  Start Tintellix on Sunday.  Recheck with me in a few weeks.  Continue pelvic PT.   You should hear from [facility].  You can also call and schedule yourself.  Phone: [phone]

## 2018-01-05 NOTE — Progress Notes (Signed)
Amanda Vazquez is a 27 y.o. female who [facility] today for anxiety, pelvic pain, slowed mentation.  Anxiety/depression: Hospital doctorAmber notes a history of anxiety previously exacerbated by her mother's poor health.  Her mother has been hospitalized for over a month and is now at home on hospice. [name] notes that she continues to take prozac 20mg  daily which helps a little. She uses Xanax once daily  which does help. She is getting phone counseling through her insurance.  She finds this to be mildly helpful.  [name] does note that she feels like her thinking is scattered and her mental speed is slowed down. This has been ongoing for a few months.  She has decreased her Xanax and notes that this is not helped at all.  She notes positive family history for ADHD.  She notes she has a adult relatives who have benefited from Vyvanse or Adderall and wonders if this may help.  When thinking back she does note that she has had difficulty with concentration and completing tasks and procrastination in her adult life when she was not severely anxious or depressed.   Pelvic Pain: [name] has restarted pelvic physical therapy and notes that although her pelvic pain is still present it is reduced and she is improving.    Past Medical History:  Diagnosis Date  . Common migraine with intractable migraine 12/16/2016  . Endometriosis   . Frequency of urination   . Migraine   . Pelvic pain in female   . Polycystic ovary disease    Past Surgical History:  Procedure Laterality Date  . ABDOMINAL HYSTERECTOMY    . DX LAPAROSCOPY W/ LASER ABLATION OF ENDOMETRIOSIS  06-12-2014   [facility]  . LAPAROSCOPIC OVARIAN CYSTECTOMY Left 01/08/2015   Procedure: LAPAROSCOPIC OVARIAN CYSTECTOMY;  Surgeon: Richardean ChimeraJohn McComb, MD;  [facility] [place];  Service: Gynecology;   Laterality: Left;  . LAPAROSCOPY N/A 01/08/2015   Procedure: LAPAROSCOPY DIAGNOSTIC;  Surgeon: Richardean ChimeraJohn McComb, MD;  [facility] [place];  Service: Gynecology;  Laterality: N/A;  . TONSILLECTOMY  age 27   Social History   Tobacco Use  . Smoking status: Never Smoker  . Smokeless tobacco: Never Used  Substance Use Topics  . Alcohol use: Yes    Comment: occasional   family history includes Alcohol abuse in her mother; Cancer in her maternal grandfather, maternal grandmother, and paternal grandfather; Diabetes in her maternal grandfather and sister; Hypertension in her maternal grandfather and paternal grandmother.  ROS as above:  Medications: Current Outpatient Medications  Medication Sig Dispense Refill  . ALPRAZolam (XANAX) 0.25 MG tablet TAKE 1 TABLET BY MOUTH TWICE DAILY AS NEEDED FOR ANXIETY, TAKE THREE TIMES DAILY ON SEVERE DAYS 60 tablet 0  . bisacodyl (DULCOLAX) 10 MG suppository Place 1 suppository (10 mg total) rectally as needed for moderate constipation. 12 suppository 0  . cloNIDine (CATAPRES) 0.2 MG tablet TAKE 1 TABLET(0.2 MG) BY MOUTH AT BEDTIME 90 tablet 1  . diazepam (VALIUM) 5 MG tablet PLACE 1 TABLET IN VAGINA THE NIGHT BEFORE PT 15 tablet 0  . estradiol (ESTRACE) 1 MG tablet TK 1 T PO D  5  . FLUoxetine (PROZAC) 20 MG capsule Take 1 capsule (20 mg total) by mouth daily. 30 capsule 1  . promethazine (PHENERGAN) 25 MG tablet TAKE 1 TABLET(25 MG) BY MOUTH EVERY 8 HOURS AS NEEDED FOR NAUSEA OR VOMITING 30 tablet 0  .  vortioxetine HBr (TRINTELLIX) 10 MG TABS tablet Take 1 tablet (10 mg total) by mouth daily. 30 tablet 1   No current facility-administered medications for this visit.    Allergies  Allergen Reactions  . Buprenorphine Nausea Only    Health Maintenance Health Maintenance  Topic Date Due  . INFLUENZA VACCINE  05/17/2018 (Originally 04/26/2018)  . TETANUS/TDAP  05/26/2018 (Originally 06/21/2009)  . PAP SMEAR  09/13/2018  . HIV Screening   Completed     Exam:  BP 120/79   Pulse 76   Wt 156 lb 12 oz (71.1 kg)   LMP 12/11/2014 (Approximate)   BMI 26.91 kg/m  Gen: Well NAD HEENT: EOMI,  MMM Lungs: Normal work of breathing. CTABL Heart: RRR no MRG Abd: NABS, Soft. Nondistended, Nontender Exts: Brisk capillary refill, warm and well perfused.  Psych: Alert and oriented. Normal speech and affect. No SI or HI.   Depression screen [facility] 2/9 11/30/2017 10/26/2017 07/19/2017 06/21/2017 06/07/2017  Decreased Interest 3 2 3 2 2   Down, Depressed, Hopeless 3 3 3 2 2   PHQ - 2 Score 6 5 6 4 4   Altered sleeping 3 2 3 3 2   Tired, decreased energy 3 2 1 3 3   Change in appetite 3 3 3 3 3   Feeling bad or failure about yourself  2 2 1 2 1   Trouble concentrating 3 3 3 3 2   Moving slowly or fidgety/restless 3 3 1 3 3   Suicidal thoughts 0 0 0 0 0  PHQ-9 Score 23 20 18 21 18   Difficult doing work/chores Extremely dIfficult - Very difficult - -  Some encounter information is confidential and restricted. Go to Review Flowsheets activity to see all data.   GAD 7 : Generalized Anxiety Score 11/30/2017 10/26/2017 09/15/2017 07/19/2017  Nervous, Anxious, on Edge 3 3 3 3   Control/stop worrying 3 3 3 3   Worry too much - different things 3 3 3 3   Trouble relaxing 3 3 3 3   Restless 3 3 3 3   Easily annoyed or irritable 3 3 3 3   Afraid - awful might happen 3 3 3 3   Total GAD 7 Score 21 21 21 21   Anxiety Difficulty Extremely difficult Extremely difficult Very difficult Very difficult  Some encounter information is confidential and restricted. Go to Review Flowsheets activity to see all data.      Assessment and Plan: 27 y.o. female with  Anxiety/depression: Continues to be not well controlled.  Increase Prozac did not help.  Plan to switch to Trintellix as this may help with both slowed mentation depression symptoms as well as anxiety.  Recheck in about 2 weeks.  Slowed Mentation: Likely depression possibly ADHD.  Switching to Trintellix which has  indications for slowed mentation with depression.  Patient has now failed 2 SSRIs Zoloft and Prozac.  Additionally patient is interested in pursuing an ADHD evaluation.  We discussed options and will plan to refer to [facility] as her coexisting anxiety and depression symptoms certainly make a diagnosis of adult ADHD very difficult.   handled by  Pelvic pain: Improving.  Continue pelvic physical therapy.  Recheck in 2 weeks   Orders Placed This Encounter  Procedures  . Ambulatory referral to Psychiatry    Referral Priority:   Routine    Referral Type:   Psychiatric    Referral Reason:   Specialty Services Required    Requested Specialty:   Psychiatry    Number of Visits Requested:   1  Meds ordered this encounter  Medications  . vortioxetine HBr (TRINTELLIX) 10 MG TABS tablet    Sig: Take 1 tablet (10 mg total) by mouth daily.    Dispense:  30 tablet    Refill:  1    Pt failed prozac and zoloft     Discussed warning signs or symptoms. Please see discharge instructions. Patient expresses understanding.

## 2018-01-08 ENCOUNTER — Telehealth: Payer: Self-pay | Admitting: Family Medicine

## 2018-01-08 NOTE — Telephone Encounter (Signed)
I spoke with the folks at [facility].  They think they can do the evaluation we dicussed. I recommend you make an appointment.

## 2018-01-09 DIAGNOSIS — N809 Endometriosis, unspecified: Secondary | ICD-10-CM | POA: Diagnosis not present

## 2018-01-15 ENCOUNTER — Telehealth: Payer: Self-pay | Admitting: Family Medicine

## 2018-01-15 NOTE — Telephone Encounter (Signed)
Received fax from [employer] that Trintellix requires a PA. Information has been sent to the insurance company. Awaiting determination.   

## 2018-01-16 DIAGNOSIS — N809 Endometriosis, unspecified: Secondary | ICD-10-CM | POA: Diagnosis not present

## 2018-01-16 DIAGNOSIS — G8929 Other chronic pain: Secondary | ICD-10-CM | POA: Diagnosis not present

## 2018-01-17 DIAGNOSIS — M6289 Other specified disorders of muscle: Secondary | ICD-10-CM | POA: Diagnosis not present

## 2018-01-17 DIAGNOSIS — R102 Pelvic and perineal pain: Secondary | ICD-10-CM | POA: Diagnosis not present

## 2018-01-17 DIAGNOSIS — M7918 Myalgia, other site: Secondary | ICD-10-CM | POA: Diagnosis not present

## 2018-01-18 ENCOUNTER — Encounter: Payer: Self-pay | Admitting: Family Medicine

## 2018-01-18 ENCOUNTER — Ambulatory Visit (INDEPENDENT_AMBULATORY_CARE_PROVIDER_SITE_OTHER): Payer: 59 | Admitting: Family Medicine

## 2018-01-18 VITALS — BP 99/64 | HR 64 | Ht 64.02 in | Wt 152.0 lb

## 2018-01-18 DIAGNOSIS — F332 Major depressive disorder, recurrent severe without psychotic features: Secondary | ICD-10-CM

## 2018-01-18 DIAGNOSIS — R102 Pelvic and perineal pain: Secondary | ICD-10-CM | POA: Diagnosis not present

## 2018-01-18 DIAGNOSIS — G8929 Other chronic pain: Secondary | ICD-10-CM

## 2018-01-18 DIAGNOSIS — F411 Generalized anxiety disorder: Secondary | ICD-10-CM | POA: Diagnosis not present

## 2018-01-18 MED ORDER — ALPRAZOLAM 0.25 MG PO TABS: ORAL_TABLET | ORAL | 0 refills | Status: DC

## 2018-01-18 NOTE — Progress Notes (Signed)
Amanda Vazquez is a 27 y.o. female who presents to [facility]: Primary Care Sports Medicine today for follow up anxiety, depression and chronic pelvic pain.   Anxiety/depression: Hospital doctor notes continued quite severe anxiety/depression symptoms.  At the last visit she was weaned off of Prozac with intention to start on Trintellix.  This is not covered under her insurance currently and she is working on the authorization for it.  She notes off of the Prozac her depression and anxiety did not change much at all.  She notes continued severe symptoms but overall is about the same.  She continues to use Xanax occasionally for management of her anxiety symptoms.  Chronic pelvic pain: [name] continues to struggle with chronic pelvic pain.  She was recently seen at [facility] urgent care where she was prescribed a 12 tablet supply of oxycodone.  She was additionally referred to Dr. Toni Arthurs who specializes in pelvic pain.  She had her first appointment on the 23rd.  The OB/GYN doctor stopped the estrogen and has started progesterones.  Additionally he referred her to his own pelvic physical therapy group.    Past Medical History:  Diagnosis Date  . Common migraine with intractable migraine 12/16/2016  . Endometriosis   . Frequency of urination   . Migraine   . Pelvic pain in female   . Polycystic ovary disease    Past Surgical History:  Procedure Laterality Date  . ABDOMINAL HYSTERECTOMY    . DX LAPAROSCOPY W/ LASER ABLATION OF ENDOMETRIOSIS  06-12-2014   [facility]  . LAPAROSCOPIC OVARIAN CYSTECTOMY Left 01/08/2015   Procedure: LAPAROSCOPIC OVARIAN CYSTECTOMY;  Surgeon: Richardean Chimera, MD;  Location: [facility];  Service: Gynecology;  Laterality: Left;  . LAPAROSCOPY N/A 01/08/2015   Procedure: LAPAROSCOPY DIAGNOSTIC;  Surgeon: Richardean Chimera, MD;  Location: [facility];   Service: Gynecology;  Laterality: N/A;  . TONSILLECTOMY  age 61   Social History   Tobacco Use  . Smoking status: Never Smoker  . Smokeless tobacco: Never Used  Substance Use Topics  . Alcohol use: Yes    Comment: occasional   family history includes Alcohol abuse in her mother; Cancer in her maternal grandfather, maternal grandmother, and paternal grandfather; Diabetes in her maternal grandfather and sister; Hypertension in her maternal grandfather and paternal grandmother.  ROS as above:  Medications: Current Outpatient Medications  Medication Sig Dispense Refill  . [START ON 01/26/2018] ALPRAZolam (XANAX) 0.25 MG tablet TAKE 1 TABLET BY MOUTH TWICE DAILY AS NEEDED FOR ANXIETY, TAKE THREE TIMES DAILY ON SEVERE DAYS 60 tablet 0  . bisacodyl (DULCOLAX) 10 MG suppository Place rectally.    . cloNIDine (CATAPRES) 0.2 MG tablet TAKE 1 TABLET(0.2 MG) BY MOUTH AT BEDTIME 90 tablet 1  . diazepam (VALIUM) 5 MG tablet PLACE 1 TABLET IN VAGINA THE NIGHT BEFORE PT 15 tablet 0  . norethindrone (AYGESTIN) 5 MG tablet Take by mouth.    . promethazine (PHENERGAN) 25 MG tablet TAKE 1 TABLET(25 MG) BY MOUTH EVERY 8 HOURS AS NEEDED FOR NAUSEA OR VOMITING 30 tablet 0  . vortioxetine HBr (TRINTELLIX) 10 MG TABS tablet Take 1 tablet (10 mg total) by mouth daily. (Patient not taking: Reported on 01/18/2018) 30 tablet 1   No current facility-administered medications for this visit.    Allergies  Allergen Reactions  . Buprenorphine Nausea Only    Health Maintenance Health Maintenance  Topic Date Due  . INFLUENZA VACCINE  05/17/2018 (  Originally 04/26/2018)  . TETANUS/TDAP  05/26/2018 (Originally 06/21/2009)  . PAP SMEAR  09/13/2018  . HIV Screening  Completed     Exam:  BP 99/64   Pulse 64   Ht 5' 4.02" (1.626 m)   Wt 152 lb (68.9 kg)   LMP 12/11/2014 (Approximate)   SpO2 100%   BMI 26.08 kg/m  Gen: Well NAD HEENT: EOMI,  MMM Lungs: Normal work of breathing. CTABL Heart: RRR no MRG Abd:  NABS, Soft. Nondistended, Nontender Exts: Brisk capillary refill, warm and well perfused.  Psych alert and oriented normal speech thought process and affect.  Depression screen [facility] 2/9 01/18/2018 11/30/2017 10/26/2017 07/19/2017 06/21/2017  Decreased Interest 3 3 2 3 2   Down, Depressed, Hopeless 3 3 3 3 2   PHQ - 2 Score 6 6 5 6 4   Altered sleeping 3 3 2 3 3   Tired, decreased energy 3 3 2 1 3   Change in appetite 3 3 3 3 3   Feeling bad or failure about yourself  3 2 2 1 2   Trouble concentrating 3 3 3 3 3   Moving slowly or fidgety/restless 3 3 3 1 3   Suicidal thoughts 0 0 0 0 0  PHQ-9 Score 24 23 20 18 21   Difficult doing work/chores Extremely dIfficult Extremely dIfficult - Very difficult -  Some encounter information is confidential and restricted. Go to Review Flowsheets activity to see all data.   GAD 7 : Generalized Anxiety Score 01/18/2018 11/30/2017 10/26/2017 09/15/2017  Nervous, Anxious, on Edge 3 3 3 3   Control/stop worrying 3 3 3 3   Worry too much - different things 3 3 3 3   Trouble relaxing 3 3 3 3   Restless 3 3 3 3   Easily annoyed or irritable 3 3 3 3   Afraid - awful might happen 3 3 3 3   Total GAD 7 Score 21 21 21 21   Anxiety Difficulty Extremely difficult Extremely difficult Extremely difficult Very difficult  Some encounter information is confidential and restricted. Go to Review Flowsheets activity to see all data.         Assessment and Plan: 27 y.o. female with severe anxiety and depression symptoms.  Still working on [employer] as I think this will be helpful.  Additionally pursuing possible ADHD diagnosis for symptoms of impaired concentration.  Recheck in 1 month.  Pelvic pain: Seeking second opinion which is reasonable.  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 Awaiting notes from Dr Toni ArthursSchortz.  Recheck in 1 month.   No orders of the defined types were placed in this encounter.  Meds ordered this encounter    Medications  . ALPRAZolam (XANAX) 0.25 MG tablet    Sig: TAKE 1 TABLET BY MOUTH TWICE DAILY AS NEEDED FOR ANXIETY, TAKE THREE TIMES DAILY ON SEVERE DAYS    Dispense:  60 tablet    Refill:  0    Fill on or after May 3rd     Discussed warning signs or symptoms. Please see discharge instructions. Patient expresses understanding.  CC: Hurshel KeysSchortz, Jed R, MD  [address]  [address]  [place], [place] [address]  Phone: [phone]  Fax: [phone]

## 2018-01-18 NOTE — Patient Instructions (Signed)
Thank you for coming in today. I will talk with Dr Toni ArthursSchortz I will also reach out to the Trintellix rep.  Recheck with me in 1 month.  Return sooner if needed.

## 2018-01-19 NOTE — Progress Notes (Signed)
Faxed note.

## 2018-01-21 ENCOUNTER — Other Ambulatory Visit: Payer: Self-pay | Admitting: Family Medicine

## 2018-01-22 NOTE — Telephone Encounter (Signed)
Received fax from [facility] that Trintellix was approved from 01/15/2018 through 01/16/2019. Pharmacy notified and forms sent to scan.   Reference ID: [id number].

## 2018-01-25 ENCOUNTER — Telehealth: Payer: Self-pay | Admitting: Family Medicine

## 2018-01-25 DIAGNOSIS — F332 Major depressive disorder, recurrent severe without psychotic features: Secondary | ICD-10-CM

## 2018-01-25 MED ORDER — VORTIOXETINE HBR 10 MG PO TABS: 10.0000 mg | ORAL_TABLET | Freq: Every day | ORAL | 1 refills | Status: DC

## 2018-01-25 NOTE — Telephone Encounter (Signed)
I spoke with the trintellix rep.  I updated the prescription to reflect that you had tried an failed 3 generics (cymbalta, zoloft and cymbalta). He also made sure to remind you that you need to activate the card before you try to use it.  I cannot remember if you activated it or not.  Please try to get it from [facility] today or tomorrow.

## 2018-01-25 NOTE — Telephone Encounter (Signed)
Anette reports she called [facility] and the cost is still $240 a month.

## 2018-01-25 NOTE — Telephone Encounter (Signed)
Left VM with recommendation  

## 2018-02-04 ENCOUNTER — Other Ambulatory Visit: Payer: Self-pay | Admitting: Family Medicine

## 2018-02-17 ENCOUNTER — Other Ambulatory Visit: Payer: Self-pay | Admitting: Family Medicine

## 2018-02-20 ENCOUNTER — Ambulatory Visit: Payer: Self-pay | Admitting: Family Medicine

## 2018-02-25 DIAGNOSIS — R102 Pelvic and perineal pain: Secondary | ICD-10-CM | POA: Diagnosis not present

## 2018-02-25 DIAGNOSIS — R1032 Left lower quadrant pain: Secondary | ICD-10-CM | POA: Diagnosis not present

## 2018-02-28 ENCOUNTER — Ambulatory Visit (INDEPENDENT_AMBULATORY_CARE_PROVIDER_SITE_OTHER): Payer: 59 | Admitting: Family Medicine

## 2018-02-28 ENCOUNTER — Encounter: Payer: Self-pay | Admitting: Family Medicine

## 2018-02-28 VITALS — BP 105/72 | HR 69 | Wt 147.0 lb

## 2018-02-28 DIAGNOSIS — R102 Pelvic and perineal pain: Secondary | ICD-10-CM

## 2018-02-28 DIAGNOSIS — F411 Generalized anxiety disorder: Secondary | ICD-10-CM | POA: Diagnosis not present

## 2018-02-28 DIAGNOSIS — G8929 Other chronic pain: Secondary | ICD-10-CM

## 2018-02-28 DIAGNOSIS — F332 Major depressive disorder, recurrent severe without psychotic features: Secondary | ICD-10-CM

## 2018-02-28 MED ORDER — ALPRAZOLAM 0.25 MG PO TABS: ORAL_TABLET | ORAL | 0 refills | Status: DC

## 2018-02-28 MED ORDER — PAROXETINE HCL 10 MG PO TABS: 10.0000 mg | ORAL_TABLET | Freq: Every day | ORAL | 1 refills | Status: DC

## 2018-02-28 NOTE — Patient Instructions (Addendum)
Thank you for coming in today. We will try Paxil.  Recheck in 1 month Return sooner if needed.   Keep me updated with your mom If you feel bad on Paxil 10 decrease the dose to 1/2 for 1 week.   I will sent a letter to Dr Toni ArthursSchortz

## 2018-02-28 NOTE — Progress Notes (Signed)
Note duplication error 

## 2018-02-28 NOTE — Progress Notes (Signed)
Amanda Vazquez is a 27 y.o. female who [facility] today for anxiety depression and chronic pelvic pain.  Anxiety and depression.  Amanda Vazquez remains significantly symptomatic with both anxiety and depression symptoms.  As noted previously her mother remains very ill and is currently in the hospital.  Amanda Vazquez is the primary medical decision maker and  struggling with trying to make a decision as to provide comfort measures or continue with current medical care.  She continues to use Xanax intermittently for this typically 60 tablets/month.  She was prescribed Trintellix but unfortunately was not able to get it is it is quite expensive despite the manufacturer coupon.  She denies SI or HI currently.  Additionally she notes continued chronic pelvic pain.  She was last seen by Dr. Toni ArthursSchortz for pelvic pain in late April.  She has not yet scheduled a follow-up appointment.  She continues to experience intermittent episodes of cramping or stabbing pain along with baseline chronic pain.  She denies any vomiting or diarrhea.  She denies any urinary symptoms today.   ROS as above:  Exam:  BP 105/72   Pulse 69   Wt 147 lb (66.7 kg)   LMP 12/11/2014 (Approximate)   BMI 25.22 kg/m  Gen: Well NAD HEENT: EOMI,  MMM Lungs: Normal work of breathing. CTABL Heart: RRR no MRG Abd: NABS, Soft. Nondistended, mildly tender to palpation left lower quadrant without rebound or guarding Exts: Brisk capillary refill, warm and well perfused.  Psych flat affect normal speech thought process.  No SI or HI expressed.  Depression screen [facility] 2/9 02/28/2018 01/18/2018 11/30/2017 10/26/2017 07/19/2017  Decreased Interest 2 3 3 2 3   Down, Depressed, Hopeless 3 3 3 3 3   PHQ - 2 Score 5 6 6 5 6   Altered sleeping 3 3 3 2 3   Tired, decreased energy 3 3 3 2 1   Change in appetite 3 3 3 3 3   Feeling bad  or failure about yourself  2 3 2 2 1   Trouble concentrating 3 3 3 3 3   Moving slowly or fidgety/restless 3 3 3 3 1   Suicidal thoughts 0 0 0 0 0  PHQ-9 Score 22 24 23 20 18   Difficult doing work/chores Extremely dIfficult Extremely dIfficult Extremely dIfficult - Very difficult  Some encounter information is confidential and restricted. Go to Review Flowsheets activity to see all data.  Some recent data might be hidden   GAD 7 : Generalized Anxiety Score 02/28/2018 01/18/2018 11/30/2017 10/26/2017  Nervous, Anxious, on Edge 3 3 3 3   Control/stop worrying 3 3 3 3   Worry too much - different things 3 3 3 3   Trouble relaxing 3 3 3 3   Restless 3 3 3 3   Easily annoyed or irritable 3 3 3 3   Afraid - awful might happen 3 3 3 3   Total GAD 7 Score 21 21 21 21   Anxiety Difficulty Extremely difficult Extremely difficult Extremely difficult Extremely difficult  Some encounter information is confidential and restricted. Go to Review Flowsheets activity to see all data.        Assessment and Plan: 27 y.o. female with  Anxiety and depression: Not doing well.  Plan to continue intermittent Xanax.  Additionally will try prescribing Paxil.  Additionally will fill out manufactures assistance paperwork for Trintellix.  Recheck in 1 month.  Additionally with a lengthy discussion about goals of care and decision making regarding her mother's health.  Chronic pelvic pain: We will ReachOut to Dr. Toni Arthurs to discuss next steps.  Continue pelvic physical therapy.  Check 1 month.  I spent 25 minutes with this patient, greater than 50% was face-to-face time counseling regarding ddx and plan.   No orders of the defined types were placed in this encounter.  Meds ordered this encounter  Medications  . PARoxetine (PAXIL) 10 MG tablet    Sig: Take 1 tablet (10 mg total) by mouth daily.    Dispense:  30 tablet    Refill:  1  . ALPRAZolam (XANAX) 0.25 MG tablet    Sig: TAKE 1 TABLET BY MOUTH TWICE DAILY AS  NEEDED FOR ANXIETY, TAKE 1 TABLET BY MOUTH THREE TIMES DAILY ON SEVERE DAYS    Dispense:  60 tablet    Refill:  0     Historical information moved to improve visibility of documentation.  Past Medical History:  Diagnosis Date  . Common migraine with intractable migraine 12/16/2016  . Endometriosis   . Frequency of urination   . Migraine   . Pelvic pain in female   . Polycystic ovary disease    Past Surgical History:  Procedure Laterality Date  . ABDOMINAL HYSTERECTOMY    . DX LAPAROSCOPY W/ LASER ABLATION OF ENDOMETRIOSIS  06-12-2014   [facility]  . LAPAROSCOPIC OVARIAN CYSTECTOMY Left 01/08/2015   Procedure: LAPAROSCOPIC OVARIAN CYSTECTOMY;  Surgeon: Richardean Chimera, MD;  Location: [facility];  Service: Gynecology;  Laterality: Left;  . LAPAROSCOPY N/A 01/08/2015   Procedure: LAPAROSCOPY DIAGNOSTIC;  Surgeon: Richardean Chimera, MD;  Location[facility];  Service: Gynecology;  Laterality: N/A;  . TONSILLECTOMY  age 81   Social History   Tobacco Use  . Smoking status: Never Smoker  . Smokeless tobacco: Never Used  Substance Use Topics  . Alcohol use: Yes    Comment: occasional   family history includes Alcohol abuse in her mother; Cancer in her maternal grandfather, maternal grandmother, and paternal grandfather; Diabetes in her maternal grandfather and sister; Hypertension in her maternal grandfather and paternal grandmother.  Medications: Current Outpatient Medications  Medication Sig Dispense Refill  . ALPRAZolam (XANAX) 0.25 MG tablet TAKE 1 TABLET BY MOUTH TWICE DAILY AS NEEDED FOR ANXIETY, TAKE 1 TABLET BY MOUTH THREE TIMES DAILY ON SEVERE DAYS 60 tablet 0  . bisacodyl (DULCOLAX) 10 MG suppository Place rectally.    . cloNIDine (CATAPRES) 0.2 MG tablet TAKE 1 TABLET(0.2 MG) BY MOUTH AT BEDTIME 90 tablet 1  . diazepam (VALIUM) 5 MG tablet PLACE 1 TABLET VAGINALLY THE NIGHT BEFORE PROCEDURE 5 tablet 0  . promethazine (PHENERGAN) 25 MG  tablet TAKE 1 TABLET(25 MG) BY MOUTH EVERY 8 HOURS AS NEEDED FOR NAUSEA OR VOMITING 30 tablet 0  . norethindrone (AYGESTIN) 5 MG tablet Take by mouth.    Marland Kitchen PARoxetine (PAXIL) 10 MG tablet Take 1 tablet (10 mg total) by mouth daily. 30 tablet 1  . vortioxetine HBr (TRINTELLIX) 10 MG TABS tablet Take 1 tablet (10 mg total) by mouth daily. (Patient not taking: Reported on 02/28/2018) 30 tablet 1   No current facility-administered medications for this visit.    Allergies  Allergen Reactions  . Buprenorphine Nausea Only    Health Maintenance Health Maintenance  Topic Date Due  . INFLUENZA VACCINE  05/17/2018 (Originally 04/26/2018)  . TETANUS/TDAP  05/26/2018 (Originally 06/21/2009)  . PAP SMEAR  09/13/2018  . HIV Screening  Completed    Discussed warning signs or symptoms. Please  see discharge instructions. Patient expresses understanding.

## 2018-03-02 ENCOUNTER — Telehealth: Payer: Self-pay

## 2018-03-02 NOTE — Telephone Encounter (Signed)
Dr. Waldron LabsShorts called back on 03/01/2018 @2 :40 pm  returning your call on mutual patient. Please return his call at [phone]. Rhonda Cunningham,CMA

## 2018-03-02 NOTE — Telephone Encounter (Signed)
I called Dr Toni ArthursSchortz and we discussed Lindalee's care.  Plan to restart pelvic PT (Dr Toni ArthursSchortz will re-order).  Follow up with Dr Toni ArthursSchortz in 6 weeks after pelvic PT.  I will continue to manage IBS and depression.  Continue vagina suppositories prior to PT (will change to include baclofen likely).

## 2018-03-05 NOTE — Telephone Encounter (Signed)
Will refill with 15 tabs at the next refill.

## 2018-03-05 NOTE — Telephone Encounter (Signed)
Spoke to patient gave her advise as noted below. Patient stated that she usually get 15 tablets for the Valium to last her for a month of PT. Please advise that patient is requesting 15 tablets instead of 5.  She stated that she will follow up with Dr. Toni ArthursSchortz. Alando Colleran,CMA

## 2018-03-06 NOTE — Telephone Encounter (Signed)
Left message on patient vm advising that 15 will be prescribed at the next refill and to call the office if she had additional questions. Rhonda Cunningham,CMA

## 2018-03-15 DIAGNOSIS — G8929 Other chronic pain: Secondary | ICD-10-CM | POA: Diagnosis not present

## 2018-03-15 DIAGNOSIS — R102 Pelvic and perineal pain: Secondary | ICD-10-CM | POA: Diagnosis not present

## 2018-03-24 DIAGNOSIS — R102 Pelvic and perineal pain: Secondary | ICD-10-CM | POA: Diagnosis not present

## 2018-03-24 DIAGNOSIS — G8929 Other chronic pain: Secondary | ICD-10-CM | POA: Diagnosis not present

## 2018-04-03 ENCOUNTER — Ambulatory Visit: Payer: Self-pay | Admitting: Family Medicine

## 2018-04-03 ENCOUNTER — Encounter: Payer: Self-pay | Admitting: Family Medicine

## 2018-04-03 ENCOUNTER — Ambulatory Visit (INDEPENDENT_AMBULATORY_CARE_PROVIDER_SITE_OTHER): Payer: 59 | Admitting: Family Medicine

## 2018-04-03 VITALS — BP 106/72 | HR 77 | Ht 62.0 in | Wt 142.0 lb

## 2018-04-03 DIAGNOSIS — R102 Pelvic and perineal pain: Secondary | ICD-10-CM

## 2018-04-03 DIAGNOSIS — F332 Major depressive disorder, recurrent severe without psychotic features: Secondary | ICD-10-CM

## 2018-04-03 DIAGNOSIS — F411 Generalized anxiety disorder: Secondary | ICD-10-CM | POA: Diagnosis not present

## 2018-04-03 DIAGNOSIS — G8929 Other chronic pain: Secondary | ICD-10-CM | POA: Diagnosis not present

## 2018-04-03 MED ORDER — BACLOFEN 10 MG PO TABS: 10.0000 mg | ORAL_TABLET | Freq: Three times a day (TID) | ORAL | 1 refills | Status: DC | PRN

## 2018-04-03 MED ORDER — PAROXETINE HCL 20 MG PO TABS: 20.0000 mg | ORAL_TABLET | Freq: Every day | ORAL | 1 refills | Status: DC

## 2018-04-03 MED ORDER — ALPRAZOLAM 0.25 MG PO TABS
0.2500 mg | ORAL_TABLET | Freq: Every evening | ORAL | 1 refills | Status: DC | PRN
Start: 2018-04-03 — End: 2018-06-05

## 2018-04-03 NOTE — Progress Notes (Signed)
Amanda Vazquez is a 27 y.o. female who presents to [facility]: Primary [facility] Sports Medicine today for follow-up mood and abdominal pain.  Amanda Vazquez has been seen multiple times previously for severe anxiety and depression symptoms.  She notes significant anhedonia and severe anxiety.  She notes that her mother is significantly ill and currently in the hospital at [facility].  She at the last visit was started on Paxil and notes some improvement in her depression symptoms.  She tolerates it well.  Additionally she uses Xanax at bedtime to help with anxiety symptoms which seems to help quite a bit.  As for abdominal pain she continues to have significant episodes of abdominal pain.  She is co-managed by Dr. Toni Arthurs.  She is attempting to schedule follow-up appointment as she is been having episodes of pain flareups.  She is been doing pelvic physical therapy which has been helpful.  She is been to urgent care several times and received oxycodone.  She notes this does help during her episodes of flareups.  She has a pertinent medical history for opiate dependency in the past.  She is interested in the potential prescription for oxycodone for use intermittently.   ROS as above:  Exam:  BP 106/72   Pulse 77   Ht 5\' 2"  (1.575 m)   Wt 142 lb (64.4 kg)   LMP 12/11/2014 (Approximate)   BMI 25.97 kg/m  Gen: Well NAD HEENT: EOMI,  MMM Lungs: Normal work of breathing. CTABL Heart: RRR no MRG Abd: NABS, Soft. Nondistended, mildly tender to palpation Exts: Brisk capillary refill, warm and well perfused.  Psych alert and oriented normal speech and thought process.  No SI or HI expressed.  Depression screen [facility] 2/9 04/03/2018 02/28/2018 01/18/2018 11/30/2017 10/26/2017  Decreased Interest 2 2 3 3 2   Down, Depressed, Hopeless 3 3 3 3 3   PHQ - 2 Score 5 5 6 6 5   Altered sleeping 3 3 3 3 2   Tired, decreased  energy 3 3 3 3 2   Change in appetite 3 3 3 3 3   Feeling bad or failure about yourself  2 2 3 2 2   Trouble concentrating 2 3 3 3 3   Moving slowly or fidgety/restless 3 3 3 3 3   Suicidal thoughts 0 0 0 0 0  PHQ-9 Score 21 22 24 23 20   Difficult doing work/chores Extremely dIfficult Extremely dIfficult Extremely dIfficult Extremely dIfficult -  Some encounter information is confidential and restricted. Go to Review Flowsheets activity to see all data.  Some recent data might be hidden   GAD 7 : Generalized Anxiety Score 04/03/2018 02/28/2018 01/18/2018 11/30/2017  Nervous, Anxious, on Edge 3 3 3 3   Control/stop worrying 3 3 3 3   Worry too much - different things 3 3 3 3   Trouble relaxing 3 3 3 3   Restless 3 3 3 3   Easily annoyed or irritable 3 3 3 3   Afraid - awful might happen 3 3 3 3   Total GAD 7 Score 21 21 21 21   Anxiety Difficulty Extremely difficult Extremely difficult Extremely difficult Extremely difficult  Some encounter information is confidential and restricted. Go to Review Flowsheets activity to see all data.      Lab and Radiology Results Patient researched [facility] Controlled Substance Reporting System.     Assessment and Plan: 27 y.o. female with  Major depressive disorder.  Slight improvement with 10 mg of Paxil daily.  Plan increase Paxil  to 20 mg daily and recheck in 1 month.  Generalized anxiety disorder: Again slight improvement with Paxil.  Increase to 20 mg.  Patient is only really using Xanax at night.  Switch to 30 tablets/month.  Pelvic pain: Chronic issue.  The majority of the care is currently being provided by Dr. Toni Arthurs.  I believe the baseline care should be pelvic physical therapy and bowel management.  Amanda Vazquez is looking for a good way to control pain during episodes of flareups.  Unfortunately I do not think opiates are a great idea right now she is had problems with dependency in the past.  I will discuss the case again with Dr. Toni Arthurs.  Ultimately  she requires frequent opiates she may benefit from pain management. Trial of baclofen for episodes of pain and spasm.  Recheck 1 month.   No orders of the defined types were placed in this encounter.  Meds ordered this encounter  Medications  . PARoxetine (PAXIL) 20 MG tablet    Sig: Take 1 tablet (20 mg total) by mouth daily.    Dispense:  30 tablet    Refill:  1    Replaces 10mg  dose  . ALPRAZolam (XANAX) 0.25 MG tablet    Sig: Take 1 tablet (0.25 mg total) by mouth at bedtime as needed for anxiety.    Dispense:  30 tablet    Refill:  1  . baclofen (LIORESAL) 10 MG tablet    Sig: Take 1 tablet (10 mg total) by mouth 3 (three) times daily as needed (abd spasm).    Dispense:  30 each    Refill:  1     Historical information moved to improve visibility of documentation.  Past Medical History:  Diagnosis Date  . Common migraine with intractable migraine 12/16/2016  . Endometriosis   . Frequency of urination   . Migraine   . Pelvic pain in female   . Polycystic ovary disease    Past Surgical History:  Procedure Laterality Date  . ABDOMINAL HYSTERECTOMY    . DX LAPAROSCOPY W/ LASER ABLATION OF ENDOMETRIOSIS  06-12-2014   [facility]  . LAPAROSCOPIC OVARIAN CYSTECTOMY Left 01/08/2015   Procedure: LAPAROSCOPIC OVARIAN CYSTECTOMY;  Surgeon: Richardean Chimera, MD;  Location[facility];  Service: Gynecology;  Laterality: Left;  . LAPAROSCOPY N/A 01/08/2015   Procedure: LAPAROSCOPY DIAGNOSTIC;  Surgeon: Richardean Chimera, MD;  Location: [facility];  Service: Gynecology;  Laterality: N/A;  . TONSILLECTOMY  age 39   Social History   Tobacco Use  . Smoking status: Never Smoker  . Smokeless tobacco: Never Used  Substance Use Topics  . Alcohol use: Yes    Comment: occasional   family history includes Alcohol abuse in her mother; Cancer in her maternal grandfather, maternal grandmother, and paternal grandfather; Diabetes in her maternal grandfather  and sister; Hypertension in her maternal grandfather and paternal grandmother.  Medications: Current Outpatient Medications  Medication Sig Dispense Refill  . ALPRAZolam (XANAX) 0.25 MG tablet Take 1 tablet (0.25 mg total) by mouth at bedtime as needed for anxiety. 30 tablet 1  . bisacodyl (DULCOLAX) 10 MG suppository Place rectally.    . cloNIDine (CATAPRES) 0.2 MG tablet TAKE 1 TABLET(0.2 MG) BY MOUTH AT BEDTIME 90 tablet 1  . diazepam (VALIUM) 5 MG tablet PLACE 1 TABLET VAGINALLY THE NIGHT BEFORE PROCEDURE 5 tablet 0  . PARoxetine (PAXIL) 20 MG tablet Take 1 tablet (20 mg total) by mouth daily. 30 tablet 1  .  promethazine (PHENERGAN) 25 MG tablet TAKE 1 TABLET(25 MG) BY MOUTH EVERY 8 HOURS AS NEEDED FOR NAUSEA OR VOMITING 30 tablet 0  . baclofen (LIORESAL) 10 MG tablet Take 1 tablet (10 mg total) by mouth 3 (three) times daily as needed (abd spasm). 30 each 1  . norethindrone (AYGESTIN) 5 MG tablet Take by mouth.     No current facility-administered medications for this visit.    Allergies  Allergen Reactions  . Buprenorphine Nausea Only     Discussed warning signs or symptoms. Please see discharge instructions. Patient expresses understanding.  Hurshel KeysSchortz, Jed R, MD Referring Physician Gynecology 04/04/2018 End  04/04/18  Phone: [phone]; Fax: [phone]

## 2018-04-03 NOTE — Patient Instructions (Addendum)
Thank you for coming in today. I will coordinate care with Dr Kathie RhodesS.  Recheck with me in 1 month Increase paxil to 20mg  daily.  Call Dr Kathie RhodesS office about ultrasound. Return sooner if needed.   Try baclofen for abdominal spasm/pain events.

## 2018-04-09 ENCOUNTER — Encounter: Payer: Self-pay | Admitting: Family Medicine

## 2018-04-16 DIAGNOSIS — R0789 Other chest pain: Secondary | ICD-10-CM | POA: Diagnosis not present

## 2018-05-08 ENCOUNTER — Ambulatory Visit (INDEPENDENT_AMBULATORY_CARE_PROVIDER_SITE_OTHER): Payer: 59 | Admitting: Family Medicine

## 2018-05-08 ENCOUNTER — Encounter: Payer: Self-pay | Admitting: Family Medicine

## 2018-05-08 VITALS — BP 115/58 | HR 69 | Wt 140.0 lb

## 2018-05-08 DIAGNOSIS — F411 Generalized anxiety disorder: Secondary | ICD-10-CM | POA: Diagnosis not present

## 2018-05-08 DIAGNOSIS — N644 Mastodynia: Secondary | ICD-10-CM

## 2018-05-08 DIAGNOSIS — F332 Major depressive disorder, recurrent severe without psychotic features: Secondary | ICD-10-CM

## 2018-05-08 DIAGNOSIS — R102 Pelvic and perineal pain: Secondary | ICD-10-CM | POA: Diagnosis not present

## 2018-05-08 DIAGNOSIS — G8929 Other chronic pain: Secondary | ICD-10-CM

## 2018-05-08 DIAGNOSIS — G894 Chronic pain syndrome: Secondary | ICD-10-CM

## 2018-05-08 MED ORDER — BUPROPION HCL ER (XL) 150 MG PO TB24: 150.0000 mg | ORAL_TABLET | ORAL | 1 refills | Status: DC

## 2018-05-08 NOTE — Patient Instructions (Addendum)
Thank you for coming in today.  Start Wellbutrin.  Send me an update in 2 weeks.  We will likely increase paxil dose from 20 to 30 or 40.    For [employer] Anhedonia  Melancholy 3 Mics  Things to think about for depression that are not medicine or therapy.  TMS treatment for depression.   [email or web address]  Recheck with me in 1 month or sooner if needed.    Baclofen can be 20mg  three times daily.     Living With Depression Everyone experiences occasional disappointment, sadness, and loss in their lives. When you are feeling down, blue, or sad for at least 2 weeks in a row, it may mean that you have depression. Depression can affect your thoughts and feelings, relationships, daily activities, and physical health. It is caused by changes in the way your brain functions. If you receive a diagnosis of depression, your health care provider will tell you which type of depression you have and what treatment options are available to you. If you are living with depression, there are ways to help you recover from it and also ways to prevent it from coming back. How to cope with lifestyle changes Coping with stress Stress is your body's reaction to life changes and events, both good and bad. Stressful situations may include:  Getting married.  The death of a spouse.  Losing a job.  Retiring.  Having a baby.  Stress can last just a few hours or it can be ongoing. Stress can play a major role in depression, so it is important to learn both how to cope with stress and how to think about it differently. Talk with your health care provider or a counselor if you would like to learn more about stress reduction. He or she may suggest some stress reduction techniques, such as:  Music therapy. This can include creating music or listening to music. Choose music that you enjoy and that inspires you.  Mindfulness-based meditation. This kind of meditation can be done  while sitting or walking. It involves being aware of your normal breaths, rather than trying to control your breathing.  Centering prayer. This is a kind of meditation that involves focusing on a spiritual word or phrase. Choose a word, phrase, or sacred image that is meaningful to you and that brings you peace.  Deep breathing. To do this, expand your stomach and inhale slowly through your nose. Hold your breath for 3-5 seconds, then exhale slowly, allowing your stomach muscles to relax.  Muscle relaxation. This involves intentionally tensing muscles then relaxing them.  Choose a stress reduction technique that fits your lifestyle and personality. Stress reduction techniques take time and practice to develop. Set aside 5-15 minutes a day to do them. Therapists can offer training in these techniques. The training may be covered by some insurance plans. Other things you can do to manage stress include:  Keeping a stress diary. This can help you learn what triggers your stress and ways to control your response.  Understanding what your limits are and saying no to requests or events that lead to a schedule that is too full.  Thinking about how you respond to certain situations. You may not be able to control everything, but you can control how you react.  Adding humor to your life by watching funny films or TV shows.  Making time for activities that help you relax and not feeling guilty about spending your time this way.  Medicines Your health care provider  may suggest certain medicines if he or she feels that they will help improve your condition. Avoid using alcohol and other substances that may prevent your medicines from working properly (may interact). It is also important to:  Talk with your pharmacist or health care provider about all the medicines that you take, their possible side effects, and what medicines are safe to take together.  Make it your goal to take part in all treatment  decisions (shared decision-making). This includes giving input on the side effects of medicines. It is best if shared decision-making with your health care provider is part of your total treatment plan.  If your health care provider prescribes a medicine, you may not notice the full benefits of it for 4-8 weeks. Most people who are treated for depression need to be on medicine for at least 6-12 months after they feel better. If you are taking medicines as part of your treatment, do not stop taking medicines without first talking to your health care provider. You may need to have the medicine slowly decreased (tapered) over time to decrease the risk of harmful side effects. Relationships Your health care provider may suggest family therapy along with individual therapy and drug therapy. While there may not be family problems that are causing you to feel depressed, it is still important to make sure your family learns as much as they can about your mental health. Having your family's support can help make your treatment successful. How to recognize changes in your condition Everyone has a different response to treatment for depression. Recovery from major depression happens when you have not had signs of major depression for two months. This may mean that you will start to:  Have more interest in doing activities.  Feel less hopeless than you did 2 months ago.  Have more energy.  Overeat less often, or have better or improving appetite.  Have better concentration.  Your health care provider will work with you to decide the next steps in your recovery. It is also important to recognize when your condition is getting worse. Watch for these signs:  Having fatigue or low energy.  Eating too much or too little.  Sleeping too much or too little.  Feeling restless, agitated, or hopeless.  Having trouble concentrating or making decisions.  Having unexplained physical complaints.  Feeling  irritable, angry, or aggressive.  Get help as soon as you or your family members notice these symptoms coming back. How to get support and help from others How to talk with friends and family members about your condition Talking to friends and family members about your condition can provide you with one way to get support and guidance. Reach out to trusted friends or family members, explain your symptoms to them, and let them know that you are working with a health care provider to treat your depression. Financial resources Not all insurance plans cover mental health care, so it is important to check with your insurance carrier. If paying for co-pays or counseling services is a problem, search for a local or county mental health care center. They may be able to offer public mental health care services at low or no cost when you are not able to see a private health care provider. If you are taking medicine for depression, you may be able to get the generic form, which may be less expensive. Some makers of prescription medicines also offer help to patients who cannot afford the medicines they need. Follow these instructions  at home:  Get the right amount and quality of sleep.  Cut down on using caffeine, tobacco, alcohol, and other potentially harmful substances.  Try to exercise, such as walking or lifting small weights.  Take over-the-counter and prescription medicines only as told by your health care provider.  Eat a healthy diet that includes plenty of vegetables, fruits, whole grains, low-fat dairy products, and lean protein. Do not eat a lot of foods that are high in solid fats, added sugars, or salt.  Keep all follow-up visits as told by your health care provider. This is important. Contact a health care provider if:  You stop taking your antidepressant medicines, and you have any of these symptoms: ? Nausea. ? Headache. ? Feeling lightheaded. ? Chills and body aches. ? Not being  able to sleep (insomnia).  You or your friends and family think your depression is getting worse. Get help right away if:  You have thoughts of hurting yourself or others. If you ever feel like you may hurt yourself or others, or have thoughts about taking your own life, get help right away. You can go to your nearest emergency department or call:  Your local emergency services (911 in the [place]  A suicide crisis helpline, such as the National Suicide Prevention Lifeline at 484-338-1495. This is open 24-hours a day.  Summary  If you are living with depression, there are ways to help you recover from it and also ways to prevent it from coming back.  Work with your health care team to create a management plan that includes counseling, stress management techniques, and healthy lifestyle habits. This information is not intended to replace advice given to you by your health care provider. Make sure you discuss any questions you have with your health care provider. Document Released: 08/15/2016 Document Revised: 08/15/2016 Document Reviewed: 08/15/2016 Elsevier Interactive Patient Education  [employer].

## 2018-05-08 NOTE — Progress Notes (Signed)
Amanda Vazquez is a 27 y.o. female who presents to [facility]: Primary Care Sports Medicine today for follow-up mood and abdominal/pelvic pain, and breast pain.  Mood: Amanda Vazquez has been battling with severe anxiety and depression symptoms.  She currently takes Paxil 20 mg daily and intermittent alprazolam.  She notes that her anxiety symptoms and depression symptoms were improving but worsened especially depression symptoms 2 weeks ago and are slightly improving now.  She notes significant anhedonia and difficulty getting out of bed in the morning.  She denies any significant changes in her life which does remain stressful.  No SI or HI expressed today.  Breast pain: In the interim patient developed left breast pain.  She was seen in urgent care and notes that her pain has been improving.  There was a thought that the pain was more costochondritis or muscular skeletal chest pain and breast pain.  She denies any nipple discharge or masses or swelling.  She notes symptoms are improving.  She tried ibuprofen which does help.  No trouble breathing or sharp chain or pain in her chest with exertion.  Abdominal pelvic pain: Slightly improved today compared to last visit.  He notes that baclofen 10 mg 3 times daily as needed is helpful but not sufficiently.  She notes that she is having normal bowel movements.     ROS as above:  Exam:  BP (!) 115/58   Pulse 69   Wt 140 lb (63.5 kg)   LMP 12/11/2014 (Approximate)   BMI 25.61 kg/m  Gen: Well NAD HEENT: EOMI,  MMM Lungs: Normal work of breathing. CTABL Heart: RRR no MRG Chest wall nontender To exam deferred Abd: NABS, Soft. Nondistended, Nontender Exts: Brisk capillary refill, warm and well perfused.  Psych alert and oriented normal speech thought process.  No SI or HI expressed.  Depression screen [facility] 2/9 05/08/2018 04/03/2018 02/28/2018 01/18/2018 11/30/2017    Decreased Interest 3 2 2 3 3   Down, Depressed, Hopeless 3 3 3 3 3   PHQ - 2 Score 6 5 5 6 6   Altered sleeping 3 3 3 3 3   Tired, decreased energy 3 3 3 3 3   Change in appetite 3 3 3 3 3   Feeling bad or failure about yourself  3 2 2 3 2   Trouble concentrating 3 2 3 3 3   Moving slowly or fidgety/restless 3 3 3 3 3   Suicidal thoughts 0 0 0 0 0  PHQ-9 Score 24 21 22 24 23   Difficult doing work/chores Extremely dIfficult Extremely dIfficult Extremely dIfficult Extremely dIfficult Extremely dIfficult  Some recent data might be hidden   GAD 7 : Generalized Anxiety Score 05/08/2018 04/03/2018 02/28/2018 01/18/2018  Nervous, Anxious, on Edge 3 3 3 3   Control/stop worrying 3 3 3 3   Worry too much - different things 3 3 3 3   Trouble relaxing 3 3 3 3   Restless 3 3 3 3   Easily annoyed or irritable 3 3 3 3   Afraid - awful might happen 3 3 3 3   Total GAD 7 Score 21 21 21 21   Anxiety Difficulty Extremely difficult Extremely difficult Extremely difficult Extremely difficult  Some encounter information is confidential and restricted. Go to Review Flowsheets activity to see all data.      Lab and Radiology Results No results found for this or any previous visit (from the past 72 hour(s)). No results found.    Assessment and Plan: 27 y.o. female with  Depression  not well controlled.  Plan to continue current dose of Paxil.  Add Wellbutrin 50 mg XL daily and readjust sooner than her follow-up in 1 month.  Likely will increase Paxil in 2 weeks when patient contact me with an update.  Breast pain/chest wall pain: Improving.  Watchful waiting recheck sooner if needed.  Abdominal/pelvic pain: Stable to improving.  Continue current regimen consider titrating up baclofen to 20 mg 3 times daily as needed.  If 20 mg is more effective lAmber will let me know and will write a new prescription.   No orders of the defined types were placed in this encounter.  Meds ordered this encounter  Medications  .  buPROPion (WELLBUTRIN XL) 150 MG 24 hr tablet    Sig: Take 1 tablet (150 mg total) by mouth every morning.    Dispense:  30 tablet    Refill:  1     Historical information moved to improve visibility of documentation.  Past Medical History:  Diagnosis Date  . Common migraine with intractable migraine 12/16/2016  . Endometriosis   . Frequency of urination   . Migraine   . Pelvic pain in female   . Polycystic ovary disease    Past Surgical History:  Procedure Laterality Date  . ABDOMINAL HYSTERECTOMY    . DX LAPAROSCOPY W/ LASER ABLATION OF ENDOMETRIOSIS  06-12-2014   [facility]  . LAPAROSCOPIC OVARIAN CYSTECTOMY Left 01/08/2015   Procedure: LAPAROSCOPIC OVARIAN CYSTECTOMY;  Surgeon: Richardean ChimeraJohn McComb, MD;  [facility] [place];  Service: Gynecology;  Laterality: Left;  . LAPAROSCOPY N/A 01/08/2015   Procedure: LAPAROSCOPY DIAGNOSTIC;  Surgeon: Richardean ChimeraJohn McComb, MD;  [facility] [place];  Service: Gynecology;  Laterality: N/A;  . TONSILLECTOMY  age 89   Social History   Tobacco Use  . Smoking status: Never Smoker  . Smokeless tobacco: Never Used  Substance Use Topics  . Alcohol use: Yes    Comment: occasional   family history includes Alcohol abuse in her mother; Cancer in her maternal grandfather, maternal grandmother, and paternal grandfather; Diabetes in her maternal grandfather and sister; Hypertension in her maternal grandfather and paternal grandmother.  Medications: Current Outpatient Medications  Medication Sig Dispense Refill  . ALPRAZolam (XANAX) 0.25 MG tablet Take 1 tablet (0.25 mg total) by mouth at bedtime as needed for anxiety. 30 tablet 1  . baclofen (LIORESAL) 10 MG tablet Take 1 tablet (10 mg total) by mouth 3 (three) times daily as needed (abd spasm). 30 each 1  . bisacodyl (DULCOLAX) 10 MG suppository Place rectally.    . cloNIDine (CATAPRES) 0.2 MG tablet TAKE 1 TABLET(0.2 MG) BY MOUTH AT BEDTIME 90 tablet 1  . diazepam  (VALIUM) 5 MG tablet PLACE 1 TABLET VAGINALLY THE NIGHT BEFORE PROCEDURE 5 tablet 0  . ibuprofen (ADVIL,MOTRIN) 600 MG tablet Take by mouth.    Marland Kitchen. PARoxetine (PAXIL) 20 MG tablet Take 1 tablet (20 mg total) by mouth daily. 30 tablet 1  . promethazine (PHENERGAN) 25 MG tablet TAKE 1 TABLET(25 MG) BY MOUTH EVERY 8 HOURS AS NEEDED FOR NAUSEA OR VOMITING 30 tablet 0  . buPROPion (WELLBUTRIN XL) 150 MG 24 hr tablet Take 1 tablet (150 mg total) by mouth every morning. 30 tablet 1  . cyclobenzaprine (FLEXERIL) 5 MG tablet   0  . norethindrone (AYGESTIN) 5 MG tablet Take by mouth.     No current facility-administered medications for this visit.    Allergies  Allergen Reactions  . Buprenorphine Nausea  Only     Discussed warning signs or symptoms. Please see discharge instructions. Patient expresses understanding.

## 2018-05-15 ENCOUNTER — Other Ambulatory Visit: Payer: Self-pay | Admitting: Family Medicine

## 2018-06-05 ENCOUNTER — Ambulatory Visit (INDEPENDENT_AMBULATORY_CARE_PROVIDER_SITE_OTHER): Payer: 59 | Admitting: Family Medicine

## 2018-06-05 ENCOUNTER — Encounter: Payer: Self-pay | Admitting: Family Medicine

## 2018-06-05 ENCOUNTER — Telehealth: Payer: Self-pay

## 2018-06-05 VITALS — BP 133/87 | HR 74 | Ht 62.0 in | Wt 135.0 lb

## 2018-06-05 DIAGNOSIS — F411 Generalized anxiety disorder: Secondary | ICD-10-CM | POA: Diagnosis not present

## 2018-06-05 DIAGNOSIS — R102 Pelvic and perineal pain: Secondary | ICD-10-CM

## 2018-06-05 DIAGNOSIS — F332 Major depressive disorder, recurrent severe without psychotic features: Secondary | ICD-10-CM | POA: Diagnosis not present

## 2018-06-05 DIAGNOSIS — G8929 Other chronic pain: Secondary | ICD-10-CM

## 2018-06-05 DIAGNOSIS — R110 Nausea: Secondary | ICD-10-CM | POA: Diagnosis not present

## 2018-06-05 MED ORDER — PROMETHAZINE HCL 25 MG PO TABS: ORAL_TABLET | ORAL | 1 refills | Status: DC

## 2018-06-05 MED ORDER — CLONIDINE HCL 0.2 MG PO TABS: ORAL_TABLET | ORAL | 1 refills | Status: DC

## 2018-06-05 MED ORDER — ONDANSETRON HCL 4 MG PO TABS: 4.0000 mg | ORAL_TABLET | Freq: Three times a day (TID) | ORAL | 0 refills | Status: DC | PRN

## 2018-06-05 MED ORDER — BUPROPION HCL ER (XL) 150 MG PO TB24: 150.0000 mg | ORAL_TABLET | ORAL | 1 refills | Status: DC

## 2018-06-05 MED ORDER — DIAZEPAM 5 MG PO TABS: ORAL_TABLET | ORAL | 0 refills | Status: DC

## 2018-06-05 MED ORDER — PAROXETINE HCL 30 MG PO TABS: 30.0000 mg | ORAL_TABLET | Freq: Every day | ORAL | 1 refills | Status: DC

## 2018-06-05 MED ORDER — ALPRAZOLAM 0.25 MG PO TABS: 0.2500 mg | ORAL_TABLET | Freq: Every evening | ORAL | 1 refills | Status: DC | PRN

## 2018-06-05 NOTE — Telephone Encounter (Signed)
Patient stated that digestive health wants her to do a colonoscopy but she would need a referral form PCP. Please advise. Amanda Vazquez,CMA

## 2018-06-05 NOTE — Progress Notes (Signed)
Amanda Vazquez is a 27 y.o. female who presents to [facility]: Primary Care Sports Medicine today for follow-up anxiety, depression and abdominal pain.  [name] has a history of anxiety and depression.  She is been slowly incrementally increasing Paxil dose and was started on Wellbutrin at the last visit.  She notes her anhedonia symptoms have improved on Wellbutrin 150.  She tolerates it well.  She notes her anxiety is still quite poorly controlled on Paxil 20.  She denies any active SI or HI.  Abdominal and pelvic pain: [name] has a history of chronic intermittent pelvic pain.  This is managed with pelvic physical therapy.  She tolerates it well and uses vaginal suppositories of Valium prior to therapy which does help.  Patient notes that she additionally has some nausea that is typically pretty well controlled with Phenergan.  She notes the Phenergan does make her sleepy.  Additionally patient notes the clonidine does help at bedtime for falling asleep.  She like to continue this as well.  ROS as above:  Exam:  BP 133/87   Pulse 74   Ht 5\' 2"  (1.575 m)   Wt 135 lb (61.2 kg)   LMP 12/11/2014 (Approximate)   BMI 24.69 kg/m  Wt Readings from Last 5 Encounters:  06/05/18 135 lb (61.2 kg)  05/08/18 140 lb (63.5 kg)  04/03/18 142 lb (64.4 kg)  02/28/18 147 lb (66.7 kg)  01/18/18 152 lb (68.9 kg)    Gen: Well NAD HEENT: EOMI,  MMM Lungs: Normal work of breathing. CTABL Heart: RRR no MRG Abd: NABS, Soft. Nondistended, Nontender Exts: Brisk capillary refill, warm and well perfused.  Psych alert and oriented normal speech thought process and affect.  Depression screen [facility] 2/9 06/05/2018 05/08/2018 04/03/2018 02/28/2018 01/18/2018  Decreased Interest 2 3 2 2 3   Down, Depressed, Hopeless 3 3 3 3 3   PHQ - 2 Score 5 6 5 5 6   Altered sleeping 0 3 3 3 3   Tired, decreased energy 2 3 3 3 3   Change in  appetite 3 3 3 3 3   Feeling bad or failure about yourself  2 3 2 2 3   Trouble concentrating 3 3 2 3 3   Moving slowly or fidgety/restless 2 3 3 3 3   Suicidal thoughts 0 0 0 0 0  PHQ-9 Score 17 24 21 22 24   Difficult doing work/chores Very difficult Extremely dIfficult Extremely dIfficult Extremely dIfficult Extremely dIfficult  Some recent data might be hidden   GAD 7 : Generalized Anxiety Score 06/05/2018 05/08/2018 04/03/2018 02/28/2018  Nervous, Anxious, on Edge 3 3 3 3   Control/stop worrying 3 3 3 3   Worry too much - different things 3 3 3 3   Trouble relaxing 3 3 3 3   Restless 3 3 3 3   Easily annoyed or irritable 3 3 3 3   Afraid - awful might happen 3 3 3 3   Total GAD 7 Score 21 21 21 21   Anxiety Difficulty Extremely difficult Extremely difficult Extremely difficult Extremely difficult  Some encounter information is confidential and restricted. Go to Review Flowsheets activity to see all data.       Assessment and Plan: 27 y.o. female with  Depression: Improved but not well controlled.  Continue Wellbutrin 150 and increase Paxil to 30 mg.  Recheck in 1 month.  Anxiety not well controlled.  Increase Paxil as above continue Xanax and recheck in 1 month.  Pelvic pain: Intermittent continue with pelvic physical therapy.  Nausea: Continue Phenergan in the evening if needed.  Try Zofran during the day as this should be less sedating.  Insomnia continue clonidine.  Flu vaccine declined.  Patient thinking about it today. Patient researched [facility] Controlled Substance Reporting System.  No orders of the defined types were placed in this encounter.  Meds ordered this encounter  Medications  . cloNIDine (CATAPRES) 0.2 MG tablet    Sig: TAKE 1 TABLET(0.2 MG) BY MOUTH AT BEDTIME    Dispense:  90 tablet    Refill:  1  . ALPRAZolam (XANAX) 0.25 MG tablet    Sig: Take 1 tablet (0.25 mg total) by mouth at bedtime as needed for anxiety.    Dispense:  30 tablet    Refill:  1  .  diazepam (VALIUM) 5 MG tablet    Sig: PLACE 1 TABLET VAGINALLY THE NIGHT BEFORE PROCEDURE    Dispense:  5 tablet    Refill:  0  . PARoxetine (PAXIL) 30 MG tablet    Sig: Take 1 tablet (30 mg total) by mouth daily.    Dispense:  30 tablet    Refill:  1    Replaces 20mg  dose  . promethazine (PHENERGAN) 25 MG tablet    Sig: TAKE 1 TABLET(25 MG) BY MOUTH EVERY 8 HOURS AS NEEDED FOR NAUSEA OR VOMITING    Dispense:  30 tablet    Refill:  1  . buPROPion (WELLBUTRIN XL) 150 MG 24 hr tablet    Sig: Take 1 tablet (150 mg total) by mouth every morning.    Dispense:  30 tablet    Refill:  1  . ondansetron (ZOFRAN) 4 MG tablet    Sig: Take 1 tablet (4 mg total) by mouth every 8 (eight) hours as needed for nausea or vomiting.    Dispense:  20 tablet    Refill:  0     Historical information moved to improve visibility of documentation.  Past Medical History:  Diagnosis Date  . Common migraine with intractable migraine 12/16/2016  . Endometriosis   . Frequency of urination   . Migraine   . Pelvic pain in female   . Polycystic ovary disease    Past Surgical History:  Procedure Laterality Date  . ABDOMINAL HYSTERECTOMY    . DX LAPAROSCOPY W/ LASER ABLATION OF ENDOMETRIOSIS  06-12-2014   [facility]  . LAPAROSCOPIC OVARIAN CYSTECTOMY Left 01/08/2015   Procedure: LAPAROSCOPIC OVARIAN CYSTECTOMY;  Surgeon: Richardean Chimera, MD;  [facility]: [facility] [place];  Service: Gynecology;  Laterality: Left;  . LAPAROSCOPY N/A 01/08/2015   Procedure: LAPAROSCOPY DIAGNOSTIC;  Surgeon: Richardean Chimera, MD;  Location: [facility];  Service: Gynecology;  Laterality: N/A;  . TONSILLECTOMY  age 5   Social History   Tobacco Use  . Smoking status: Never Smoker  . Smokeless tobacco: Never Used  Substance Use Topics  . Alcohol use: Yes    Comment: occasional   family history includes Alcohol abuse in her mother; Cancer in her maternal grandfather, maternal grandmother, and  paternal grandfather; Diabetes in her maternal grandfather and sister; Hypertension in her maternal grandfather and paternal grandmother.  Medications: Current Outpatient Medications  Medication Sig Dispense Refill  . ALPRAZolam (XANAX) 0.25 MG tablet Take 1 tablet (0.25 mg total) by mouth at bedtime as needed for anxiety. 30 tablet 1  . baclofen (LIORESAL) 10 MG tablet Take 1 tablet (10 mg total) by mouth 3 (three) times daily as needed (abd spasm). 30 each 1  .  bisacodyl (DULCOLAX) 10 MG suppository Place rectally.    Marland Kitchen buPROPion (WELLBUTRIN XL) 150 MG 24 hr tablet Take 1 tablet (150 mg total) by mouth every morning. 30 tablet 1  . cloNIDine (CATAPRES) 0.2 MG tablet TAKE 1 TABLET(0.2 MG) BY MOUTH AT BEDTIME 90 tablet 1  . cyclobenzaprine (FLEXERIL) 5 MG tablet   0  . diazepam (VALIUM) 5 MG tablet PLACE 1 TABLET VAGINALLY THE NIGHT BEFORE PROCEDURE 5 tablet 0  . ibuprofen (ADVIL,MOTRIN) 600 MG tablet Take by mouth.    Marland Kitchen PARoxetine (PAXIL) 30 MG tablet Take 1 tablet (30 mg total) by mouth daily. 30 tablet 1  . promethazine (PHENERGAN) 25 MG tablet TAKE 1 TABLET(25 MG) BY MOUTH EVERY 8 HOURS AS NEEDED FOR NAUSEA OR VOMITING 30 tablet 1  . norethindrone (AYGESTIN) 5 MG tablet Take by mouth.    . ondansetron (ZOFRAN) 4 MG tablet Take 1 tablet (4 mg total) by mouth every 8 (eight) hours as needed for nausea or vomiting. 20 tablet 0   No current facility-administered medications for this visit.    Allergies  Allergen Reactions  . Buprenorphine Nausea Only     Discussed warning signs or symptoms. Please see discharge instructions. Patient expresses understanding.

## 2018-06-05 NOTE — Patient Instructions (Addendum)
Thank you for coming in today. Increase paxil to 30mg  daily.  Continue wellbutrin, Use phenergan as needed for nausea/vomiting.  Try zofran if phenergan makes you too sleepy.  Use xanax sparingly.  Use vaginal valium prior to pelvic PT.  Consider flu shot.  Recheck in 1 month.   Send me an update in 2 weeks.    Try 20mg  baclofen at the next abdominal pain flair up.

## 2018-06-07 NOTE — Telephone Encounter (Signed)
Referral to gastroenterology ordered.

## 2018-06-07 NOTE — Telephone Encounter (Signed)
Left message on patient vm advising that referral has been placed. Rhonda Cunningham,CMA  

## 2018-06-20 ENCOUNTER — Encounter: Payer: Self-pay | Admitting: Family Medicine

## 2018-06-21 MED ORDER — BUPROPION HCL ER (XL) 300 MG PO TB24: 300.0000 mg | ORAL_TABLET | ORAL | 1 refills | Status: DC

## 2018-07-05 ENCOUNTER — Ambulatory Visit: Payer: Self-pay | Admitting: Family Medicine

## 2018-07-18 ENCOUNTER — Encounter: Payer: Self-pay | Admitting: Family Medicine

## 2018-07-18 ENCOUNTER — Ambulatory Visit (INDEPENDENT_AMBULATORY_CARE_PROVIDER_SITE_OTHER): Payer: 59 | Admitting: Family Medicine

## 2018-07-18 VITALS — BP 93/50 | HR 48 | Ht 62.0 in | Wt 132.0 lb

## 2018-07-18 DIAGNOSIS — F4321 Adjustment disorder with depressed mood: Secondary | ICD-10-CM | POA: Diagnosis not present

## 2018-07-18 DIAGNOSIS — F411 Generalized anxiety disorder: Secondary | ICD-10-CM

## 2018-07-18 DIAGNOSIS — F332 Major depressive disorder, recurrent severe without psychotic features: Secondary | ICD-10-CM | POA: Diagnosis not present

## 2018-07-18 MED ORDER — PAROXETINE HCL 30 MG PO TABS: 30.0000 mg | ORAL_TABLET | Freq: Every day | ORAL | 1 refills | Status: DC

## 2018-07-18 MED ORDER — BUPROPION HCL ER (XL) 300 MG PO TB24: 300.0000 mg | ORAL_TABLET | ORAL | 1 refills | Status: DC

## 2018-07-18 MED ORDER — ALPRAZOLAM 0.25 MG PO TABS: 0.2500 mg | ORAL_TABLET | Freq: Two times a day (BID) | ORAL | 1 refills | Status: DC | PRN

## 2018-07-18 NOTE — Patient Instructions (Signed)
I am so sorry for your loss.   Increase xanax to twice daily as needed.  Continue current dose of Wellbutrin and Paxil.  You should hear from therapy however I recommend you call them and see if you can get in with the walk in therapy sooner.   Recheck with me in about 1 month.    Return or contact me sooner if needed.    Complicated Grieving Grief is a normal response to the death of someone close to you. Feelings of fear, anger, and guilt can affect almost everyone who loses a loved one. It is also common to have symptoms of depression while you are grieving. These include problems with sleep, loss of appetite, and lack of energy. They may last for weeks or months after a loss. Complicated grief is different from normal grief or depression. Normal grieving involves sadness and feelings of loss, but these feelings are not constant. Complicated grief is a constant and severe type of grief. It interferes with your ability to function normally. It may last for several months to a year or longer. Complicated grief may require treatment from a mental health care provider. What are the causes? It is not known why some people continue to struggle with grief and others do not. You may be at higher risk for complicated grief if:  The death of your loved one was sudden or unexpected.  The death of your loved one was due to a violent event.  Your loved one committed suicide.  Your loved one was a child or a young person.  You were very close to or dependent on the loved one.  You have a history of depression.  What are the signs or symptoms? Signs and symptoms of complicated grief may include:  Feeling disbelief or numbness.  Being unable to enjoy good memories of your loved one.  Needing to avoid anything that reminds you of your loved one.  Being unable to stop thinking about the death.  Feeling intense anger or guilt.  Feeling alone and hopeless.  Feeling that your life is  meaningless and empty.  Losing the desire to live.  How is this diagnosed? Your health care provider may diagnose complicated grief if:  You have constant symptoms of grief for 6-12 months or longer.  Your symptoms are interfering with your ability to live your life.  Your health care provider may want you to see a mental health care provider. Many symptoms of depression are similar to the symptoms of complicated grief. It is important to be evaluated for complicated grief along with other mental health conditions. How is this treated? Talk therapy with a mental health provider is the most common treatment for complicated grief. During therapy, you will learn healthy ways to cope with the loss of your loved one. In some cases, your mental health care provider may also recommend antidepressant medicines. Follow these instructions at home:  Take care of yourself. ? Eat regular meals and maintain a healthy diet. Eat plenty of fruits, vegetables, and whole grains. ? Try to get some exercise each day. ? Keep regular hours for sleep. Try to get at least 8 hours of sleep each night.  Do not use drugs or alcohol to ease your symptoms.  Take medicines only as directed by your health care provider.  Spend time with friends and loved ones.  Consider joining a grief (bereavement) support group to help you deal with your loss.  Keep all follow-up visits as directed  by your health care provider. This is important. Contact a health care provider if:  Your symptoms keep you from functioning normally.  Your symptoms do not get better with treatment. Get help right away if:  You have serious thoughts of hurting yourself or someone else.  You have suicidal feelings. This information is not intended to replace advice given to you by your health care provider. Make sure you discuss any questions you have with your health care provider. Document Released: 09/12/2005 Document Revised: 02/18/2016  Document Reviewed: 02/20/2014 Elsevier Interactive Patient Education  [employer].

## 2018-07-19 NOTE — Progress Notes (Signed)
Amanda Vazquez is a 27 y.o. female who presents to [facility]: Primary Care Sports Medicine today for acute grief and follow-up depression and anxiety.  As noted previously Hospital doctor has a history of anxiety and depression exacerbated by her mother's poor health.  Amanda Vazquez's mother who had been in the hospital for quite some time with respiratory failure had been improving and was discharged home for further recovery.  Unfortunately Amanda Vazquez discovered her mother dead at home on August 10, 2023.  This was unexpected.  Amanda Vazquez is obviously grieving and feels guilty as she had been at work.  She is having difficulty sleeping and is crying and not feeling good.  She notes that her husband has been very supportive and is Associate Professor manage the details including arranging the funeral which is scheduled for Saturday, November 1.  Additionally Amanda Vazquez will soon have to deal with the estate and family members who are already in conflict over her mother's assets.  She notes all of these issues have caused increased stress and anxiety.  She takes Paxil 30 mg daily and Wellbutrin 300 mg daily.  She notes these medicines were quite helpful before Monday to manage symptoms.  Additionally she was using limited Xanax 0.25 mg daily.  She is had worsening anxiety and would like to increase to twice daily if possible so that she can sleep a little more easily.   ROS as above:  Exam:  BP (!) 93/50   Pulse (!) 48   Ht 5\' 2"  (1.575 m)   Wt 132 lb (59.9 kg)   LMP 12/11/2014 (Approximate)   BMI 24.14 kg/m  Wt Readings from Last 5 Encounters:  07/18/18 132 lb (59.9 kg)  06/05/18 135 lb (61.2 kg)  05/08/18 140 lb (63.5 kg)  04/03/18 142 lb (64.4 kg)  02/28/18 147 lb (66.7 kg)    Gen: Well NAD Psych: Alert and oriented tearful affect.  No SI or HI.  Speech and thought process are normal.     Assessment and Plan: 27 y.o.  female with  Acute grief in the setting of pre-existing anxiety and depression.  Plan to continue current Wellbutrin and Paxil doses.  Okay to increase Xanax to twice daily new prescription written for all 3 occasions.  Additionally referred back to behavioral health for further counseling as she is had benefit in the past.  Recheck in 1 month and return sooner if needed.   Orders Placed This Encounter  Procedures  . Ambulatory referral to Behavioral Health    Referral Priority:   Urgent    Referral Type:   Psychiatric    Referral Reason:   Specialty Services Required    Requested Specialty:   Behavioral Health    Number of Visits Requested:   1   Meds ordered this encounter  Medications  . ALPRAZolam (XANAX) 0.25 MG tablet    Sig: Take 1 tablet (0.25 mg total) by mouth 2 (two) times daily as needed for anxiety.    Dispense:  60 tablet    Refill:  1  . buPROPion (WELLBUTRIN XL) 300 MG 24 hr tablet    Sig: Take 1 tablet (300 mg total) by mouth every morning.    Dispense:  90 tablet    Refill:  1  . PARoxetine (PAXIL) 30 MG tablet    Sig: Take 1 tablet (30 mg total) by mouth daily.    Dispense:  90 tablet    Refill:  1  Historical information moved to improve visibility of documentation.  Past Medical History:  Diagnosis Date  . Common migraine with intractable migraine 12/16/2016  . Endometriosis   . Frequency of urination   . Migraine   . Pelvic pain in female   . Polycystic ovary disease    Past Surgical History:  Procedure Laterality Date  . ABDOMINAL HYSTERECTOMY    . DX LAPAROSCOPY W/ LASER ABLATION OF ENDOMETRIOSIS  06-12-2014   [facility]  . LAPAROSCOPIC OVARIAN CYSTECTOMY Left 01/08/2015   Procedure: LAPAROSCOPIC OVARIAN CYSTECTOMY;  Surgeon: Richardean Chimera, MD;  Location: [facility] [place];  Service: Gynecology;  Laterality: Left;  . LAPAROSCOPY N/A 01/08/2015   Procedure: LAPAROSCOPY DIAGNOSTIC;  Surgeon: Richardean Chimera, MD;  Location: [facility];  Service: Gynecology;  Laterality: N/A;  . TONSILLECTOMY  age 17   Social History   Tobacco Use  . Smoking status: Never Smoker  . Smokeless tobacco: Never Used  Substance Use Topics  . Alcohol use: Yes    Comment: occasional   family history includes Alcohol abuse in her mother; Cancer in her maternal grandfather, maternal grandmother, and paternal grandfather; Diabetes in her maternal grandfather and sister; Hypertension in her maternal grandfather and paternal grandmother.  Medications: Current Outpatient Medications  Medication Sig Dispense Refill  . ALPRAZolam (XANAX) 0.25 MG tablet Take 1 tablet (0.25 mg total) by mouth 2 (two) times daily as needed for anxiety. 60 tablet 1  . baclofen (LIORESAL) 10 MG tablet Take 1 tablet (10 mg total) by mouth 3 (three) times daily as needed (abd spasm). 30 each 1  . bisacodyl (DULCOLAX) 10 MG suppository Place rectally.    Marland Kitchen buPROPion (WELLBUTRIN XL) 300 MG 24 hr tablet Take 1 tablet (300 mg total) by mouth every morning. 90 tablet 1  . cloNIDine (CATAPRES) 0.2 MG tablet TAKE 1 TABLET(0.2 MG) BY MOUTH AT BEDTIME 90 tablet 1  . cyclobenzaprine (FLEXERIL) 5 MG tablet   0  . diazepam (VALIUM) 5 MG tablet PLACE 1 TABLET VAGINALLY THE NIGHT BEFORE PROCEDURE 5 tablet 0  . ibuprofen (ADVIL,MOTRIN) 600 MG tablet Take by mouth.    . ondansetron (ZOFRAN) 4 MG tablet Take 1 tablet (4 mg total) by mouth every 8 (eight) hours as needed for nausea or vomiting. 20 tablet 0  . PARoxetine (PAXIL) 30 MG tablet Take 1 tablet (30 mg total) by mouth daily. 90 tablet 1  . promethazine (PHENERGAN) 25 MG tablet TAKE 1 TABLET(25 MG) BY MOUTH EVERY 8 HOURS AS NEEDED FOR NAUSEA OR VOMITING 30 tablet 1  . norethindrone (AYGESTIN) 5 MG tablet Take by mouth.     No current facility-administered medications for this visit.    Allergies  Allergen Reactions  . Buprenorphine Nausea Only     Discussed warning signs or symptoms. Please see discharge  instructions. Patient expresses understanding.

## 2018-08-13 ENCOUNTER — Ambulatory Visit (HOSPITAL_COMMUNITY): Payer: Self-pay | Admitting: Licensed Clinical Social Worker

## 2018-08-20 ENCOUNTER — Ambulatory Visit (INDEPENDENT_AMBULATORY_CARE_PROVIDER_SITE_OTHER): Payer: 59 | Admitting: Family Medicine

## 2018-08-20 ENCOUNTER — Encounter: Payer: Self-pay | Admitting: Family Medicine

## 2018-08-20 VITALS — BP 127/84 | HR 73 | Wt 127.0 lb

## 2018-08-20 DIAGNOSIS — F4321 Adjustment disorder with depressed mood: Secondary | ICD-10-CM | POA: Diagnosis not present

## 2018-08-20 DIAGNOSIS — F411 Generalized anxiety disorder: Secondary | ICD-10-CM

## 2018-08-20 DIAGNOSIS — F332 Major depressive disorder, recurrent severe without psychotic features: Secondary | ICD-10-CM

## 2018-08-20 MED ORDER — PROMETHAZINE HCL 25 MG PO TABS: ORAL_TABLET | ORAL | 1 refills | Status: DC

## 2018-08-20 NOTE — Progress Notes (Signed)
Amanda Vazquez is a 27 y.o. female who presents [facility] today for follow-up anxiety and depression.  Amanda Vazquez was seen previously for severe anxiety and depression symptoms following the death of her mother.  She notes that she was doing reasonably well and the medication listed below until recently.  She notes Thanksgiving and Christmas are going to be especially hard for her.  Additionally she notes that she is having to get all of her mother stuff out of her mother's house which actually belongs to her uncle which she finds to be distressing and challenging.  Patient notes some mild passive death wish but denies any active suicidal plans or intentions.   ROS as above:  Exam:  BP 127/84   Pulse 73   Wt 127 lb (57.6 kg)   LMP 12/11/2014 (Approximate)   BMI 23.23 kg/m  Wt Readings from Last 5 Encounters:  08/20/18 127 lb (57.6 kg)  07/18/18 132 lb (59.9 kg)  06/05/18 135 lb (61.2 kg)  05/08/18 140 lb (63.5 kg)  04/03/18 142 lb (64.4 kg)    Gen: Well NAD HEENT: EOMI,  MMM Lungs: Normal work of breathing. CTABL Heart: RRR no MRG Abd: NABS, Soft. Nondistended, Nontender Exts: Brisk capillary refill, warm and well perfused.  Psych alert and oriented normal speech thought process.  Affect is tearful.  No active SI or HI expressed.  She agrees to Community education officercontract for Armed forces training and education officerverbal safety.  Depression screen [facility] 08/20/2018 06/05/2018 05/08/2018 04/03/2018 02/28/2018  Decreased Interest 3 2 3 2 2   Down, Depressed, Hopeless 3 3 3 3 3   PHQ - 2 Score 6 5 6 5 5   Altered sleeping 3 0 3 3 3   Tired, decreased energy 3 2 3 3 3   Change in appetite 3 3 3 3 3   Feeling bad or failure about yourself  3 2 3 2 2   Trouble concentrating 3 3 3 2 3   Moving slowly or fidgety/restless 3 2 3 3 3   Suicidal thoughts 1 0 0 0 0  PHQ-9 Score 25 17 24 21 22   Difficult doing work/chores Extremely  dIfficult Very difficult Extremely dIfficult Extremely dIfficult Extremely dIfficult  Some recent data might be hidden   GAD 7 : Generalized Anxiety Score 08/20/2018 06/05/2018 05/08/2018 04/03/2018  Nervous, Anxious, on Edge 3 3 3 3   Control/stop worrying 3 3 3 3   Worry too much - different things 3 3 3 3   Trouble relaxing 3 3 3 3   Restless 3 3 3 3   Easily annoyed or irritable 3 3 3 3   Afraid - awful might happen 3 3 3 3   Total GAD 7 Score 21 21 21 21   Anxiety Difficulty Extremely difficult Extremely difficult Extremely difficult Extremely difficult  Some encounter information is confidential and restricted. Go to Review Flowsheets activity to see all data.      Lab and Radiology Results No results found for this or any previous visit (from the past 72 hour(s)). No results found.    Assessment and Plan: 27 y.o. female with mood: Severe anxiety depression.  Continue current regimen.  See counseling and recheck in 1 month.  Verbal contract for safety.  Suicidal precautions reviewed. Recheck in 1 month.   No orders of the defined types were placed in this encounter.  Meds ordered this encounter  Medications  . promethazine (PHENERGAN) 25 MG tablet    Sig: TAKE 1 TABLET(25 MG) BY MOUTH EVERY 8 HOURS  AS NEEDED FOR NAUSEA OR VOMITING    Dispense:  30 tablet    Refill:  1     Historical information moved to improve visibility of documentation.  Past Medical History:  Diagnosis Date  . Common migraine with intractable migraine 12/16/2016  . Endometriosis   . Frequency of urination   . Migraine   . Pelvic pain in female   . Polycystic ovary disease    Past Surgical History:  Procedure Laterality Date  . ABDOMINAL HYSTERECTOMY    . DX LAPAROSCOPY W/ LASER ABLATION OF ENDOMETRIOSIS  06-12-2014   [facility]  . LAPAROSCOPIC OVARIAN CYSTECTOMY Left 01/08/2015   Procedure: LAPAROSCOPIC OVARIAN CYSTECTOMY;  Surgeon: Richardean Chimera, MD;  [facility];  Service: Gynecology;  Laterality: Left;  . LAPAROSCOPY N/A 01/08/2015   Procedure: LAPAROSCOPY DIAGNOSTIC;  Surgeon: Richardean Chimera, MD;  Location: [facility];  Service: Gynecology;  Laterality: N/A;  . TONSILLECTOMY  age 64   Social History   Tobacco Use  . Smoking status: Never Smoker  . Smokeless tobacco: Never Used  Substance Use Topics  . Alcohol use: Yes    Comment: occasional   family history includes Alcohol abuse in her mother; Cancer in her maternal grandfather, maternal grandmother, and paternal grandfather; Diabetes in her maternal grandfather and sister; Hypertension in her maternal grandfather and paternal grandmother.  Medications: Current Outpatient Medications  Medication Sig Dispense Refill  . ALPRAZolam (XANAX) 0.25 MG tablet Take 1 tablet (0.25 mg total) by mouth 2 (two) times daily as needed for anxiety. 60 tablet 1  . baclofen (LIORESAL) 10 MG tablet Take 1 tablet (10 mg total) by mouth 3 (three) times daily as needed (abd spasm). 30 each 1  . bisacodyl (DULCOLAX) 10 MG suppository Place rectally.    Marland Kitchen buPROPion (WELLBUTRIN XL) 300 MG 24 hr tablet Take 1 tablet (300 mg total) by mouth every morning. 90 tablet 1  . cloNIDine (CATAPRES) 0.2 MG tablet TAKE 1 TABLET(0.2 MG) BY MOUTH AT BEDTIME 90 tablet 1  . cyclobenzaprine (FLEXERIL) 5 MG tablet   0  . diazepam (VALIUM) 5 MG tablet PLACE 1 TABLET VAGINALLY THE NIGHT BEFORE PROCEDURE 5 tablet 0  . ibuprofen (ADVIL,MOTRIN) 600 MG tablet Take by mouth.    . norethindrone (AYGESTIN) 5 MG tablet Take by mouth.    . ondansetron (ZOFRAN) 4 MG tablet Take 1 tablet (4 mg total) by mouth every 8 (eight) hours as needed for nausea or vomiting. 20 tablet 0  . PARoxetine (PAXIL) 30 MG tablet Take 1 tablet (30 mg total) by mouth daily. 90 tablet 1  . promethazine (PHENERGAN) 25 MG tablet TAKE 1 TABLET(25 MG) BY MOUTH EVERY 8 HOURS AS NEEDED FOR NAUSEA OR VOMITING 30 tablet 1   No current facility-administered  medications for this visit.    Allergies  Allergen Reactions  . Buprenorphine Nausea Only     Discussed warning signs or symptoms. Please see discharge instructions. Patient expresses understanding.

## 2018-08-20 NOTE — Patient Instructions (Addendum)
Thank you for coming in today. Continue current medicine.  Recheck in about 1 month.  Return sooner if needed.   Suicidal Feelings: How to Help Yourself Suicide is the taking of one's own life. If you feel as though life is getting too tough to handle and are thinking about suicide, get help right away. To get help:  Call your local emergency services (911 in the [place]  Call a suicide hotline to speak with a trained counselor who understands how you are feeling. The following is a list of suicide hotlines in the [place]. For a list of hotlines in [place], visit InkDistributor.it. ? 1-800-273-TALK 713 493 4649). ? 1-800-SUICIDE (847)149-9973). ? 819-371-0797. This is a hotline for Spanish speakers. ? 1-800-799-4TTY 417 742 3478). This is a hotline for TTY users. ? 1-866-4-U-TREVOR 732-342-1076). This is a hotline for lesbian, gay, bisexual, transgender, or questioning youth.  Contact a crisis center or a local suicide prevention center. To find a crisis center or suicide prevention center: ? Call your local hospital, clinic, community service organization, mental health center, social service provider, or health department. Ask for assistance in connecting to a crisis center. ? Visit https://www.patel-king.com/ for a list of crisis centers in the [place], or visit www.suicideprevention.ca/thinking-about-suicide/find-a-crisis-centre for a list of centers in [place].  Visit the following websites: ? National Suicide Prevention Lifeline: www.suicidepreventionlifeline.org ? Hopeline: www.hopeline.com ? [employer] for Suicide Prevention: https://www.ayers.com/ ? [employer] (for lesbian, gay, bisexual, transgender, or questioning youth): www.thetrevorproject.org  How can I help myself feel better?  Promise yourself that you will not do anything drastic when you have suicidal feelings.  Remember, there is hope. Many people have gotten through suicidal thoughts and feelings, and you will, too. You may have gotten through them before, and this proves that you can get through them again.  Let family, friends, teachers, or counselors know how you are feeling. Try not to isolate yourself from those who care about you. Remember, they will want to help you. Talk with someone every day, even if you do not feel sociable. Face-to-face conversation is best.  Call a mental health professional and see one regularly.  Visit your primary health care provider every year.  Eat a well-balanced diet, and space your meals so you eat regularly.  Get plenty of rest.  Avoid alcohol and drugs, and remove them from your home. They will only make you feel worse.  If you are thinking of taking a lot of medicine, give your medicine to someone who can give it to you one day at a time. If you are on antidepressants and are concerned you will overdose, let your health care provider know so he or she can give you safer medicines. Ask your mental health professional about the possible side effects of any medicines you are taking.  Remove weapons, poisons, knives, and anything else that could harm you from your home.  Try to stick to routines. Follow a schedule every day. Put self-care on your schedule.  Make a list of realistic goals, and cross them off when you achieve them. Accomplishments give a sense of worth.  Wait until you are feeling better before doing the things you find difficult or unpleasant.  Exercise if you are able. You will feel better if you exercise for even a half hour each day.  Go out in the sun or into nature. This will help you recover from depression faster. If you have a favorite place to walk, go there.  Do the things that  have always given you pleasure. Play your favorite music, read a good book, paint a picture, play your favorite instrument, or do anything else that takes  your mind off your depression if it is safe to do.  Keep your living space well lit.  When you are feeling well, write yourself a letter about tips and support that you can read when you are not feeling well.  Remember that life's difficulties can be sorted out with help. Conditions can be treated. You can work on thoughts and strategies that serve you well. This information is not intended to replace advice given to you by your health care provider. Make sure you discuss any questions you have with your health care provider. Document Released: 03/19/2003 Document Revised: 05/11/2016 Document Reviewed: 01/07/2014 Elsevier Interactive Patient Education  [employer]2018 [employer]

## 2018-09-12 ENCOUNTER — Ambulatory Visit: Payer: Self-pay | Admitting: Family Medicine

## 2018-09-13 ENCOUNTER — Ambulatory Visit: Payer: Self-pay | Admitting: Family Medicine

## 2018-09-17 ENCOUNTER — Other Ambulatory Visit: Payer: Self-pay | Admitting: Family Medicine

## 2018-10-22 ENCOUNTER — Other Ambulatory Visit: Payer: Self-pay | Admitting: Family Medicine

## 2018-11-05 ENCOUNTER — Other Ambulatory Visit: Payer: Self-pay

## 2018-11-05 NOTE — Telephone Encounter (Addendum)
Patient request refill for Alprazolam 0.25 mg. Patient also stated that Phenergan makes her sleepy when she takes it so she wants  to be switched from Phenergan to Zofran. Please advise. Conrad Zajkowski,CMA

## 2018-11-06 MED ORDER — ALPRAZOLAM 0.25 MG PO TABS: ORAL_TABLET | ORAL | 0 refills | Status: DC

## 2018-11-06 NOTE — Telephone Encounter (Signed)
Xanax refilled.  Patient will need follow-up appointment with me in the near future PDMP reviewed during this encounter.

## 2018-11-06 NOTE — Telephone Encounter (Signed)
Is there going to be an Rx sent for the zofran that was requested as well?

## 2018-11-07 MED ORDER — ONDANSETRON HCL 4 MG PO TABS: 4.0000 mg | ORAL_TABLET | Freq: Three times a day (TID) | ORAL | 12 refills | Status: DC | PRN

## 2018-11-07 NOTE — Telephone Encounter (Signed)
Zofran sent. 

## 2018-11-07 NOTE — Telephone Encounter (Signed)
Left VM with update.  

## 2018-12-06 ENCOUNTER — Other Ambulatory Visit: Payer: Self-pay | Admitting: Family Medicine

## 2018-12-10 NOTE — Telephone Encounter (Signed)
[facility] requesting refill .

## 2018-12-12 ENCOUNTER — Other Ambulatory Visit: Payer: Self-pay | Admitting: Family Medicine

## 2019-01-01 ENCOUNTER — Ambulatory Visit (INDEPENDENT_AMBULATORY_CARE_PROVIDER_SITE_OTHER): Payer: Self-pay

## 2019-01-01 ENCOUNTER — Other Ambulatory Visit: Payer: Self-pay

## 2019-01-01 ENCOUNTER — Encounter: Payer: Self-pay | Admitting: Family Medicine

## 2019-01-01 ENCOUNTER — Ambulatory Visit (INDEPENDENT_AMBULATORY_CARE_PROVIDER_SITE_OTHER): Payer: Self-pay | Admitting: Family Medicine

## 2019-01-01 VITALS — BP 140/85 | HR 89 | Wt 118.0 lb

## 2019-01-01 DIAGNOSIS — R109 Unspecified abdominal pain: Secondary | ICD-10-CM

## 2019-01-01 DIAGNOSIS — K582 Mixed irritable bowel syndrome: Secondary | ICD-10-CM

## 2019-01-01 LAB — POCT URINALYSIS DIPSTICK
Bilirubin, UA: NEGATIVE
Blood, UA: NEGATIVE
Glucose, UA: NEGATIVE
Ketones, UA: 15
Leukocytes, UA: NEGATIVE
Nitrite, UA: NEGATIVE
Protein, UA: NEGATIVE
Spec Grav, UA: 1.02 (ref 1.010–1.025)
Urobilinogen, UA: 0.2 E.U./dL
pH, UA: 7 (ref 5.0–8.0)

## 2019-01-01 MED ORDER — BACLOFEN 10 MG PO TABS: 10.0000 mg | ORAL_TABLET | Freq: Three times a day (TID) | ORAL | 1 refills | Status: DC | PRN

## 2019-01-01 MED ORDER — NORETHINDRONE ACETATE 5 MG PO TABS: 5.0000 mg | ORAL_TABLET | Freq: Every day | ORAL | 0 refills | Status: DC

## 2019-01-01 MED ORDER — DIPHENOXYLATE-ATROPINE 2.5-0.025 MG PO TABS: ORAL_TABLET | ORAL | 3 refills | Status: DC

## 2019-01-01 NOTE — Patient Instructions (Addendum)
Thank you for coming in today.  Keep me updated.  Take the lomotil and the muscle relaxer.  Send me a Clinical cytogeneticistmychart message early tomorrow.  Start self isolation.  I think it is unlikely that you have COVID-19.   Suspected COVID-19 self isolation guidelines.   Please self isolate for at least 3 days (72 hours) have passed since recovery defined as resolution of fever without the use of fever-reducing medications and improvement in respiratory symptoms (e.g., cough, shortness of breath), and at least 7 days have passed since symptoms first appeared.   You do not need a negative test to document recovery.  Close contacts of a person with known or suspected COVID-19 should self-monitor their temperature and symptoms of COVID-19, limit outside interaction as much as possible for 14 days, and self-isolate if they develop symptoms.     Irritable Bowel Syndrome, Adult  Irritable bowel syndrome (IBS) is a group of symptoms that affects the organs responsible for digestion (gastrointestinal or GI tract). IBS is not one specific disease. To regulate how the GI tract works, the body sends signals back and forth between the intestines and the brain. If you have IBS, there may be a problem with these signals. As a result, the GI tract does not function normally. The intestines may become more sensitive and overreact to certain things. This may be especially true when you eat certain foods or when you are under stress. There are four types of IBS. These may be determined based on the consistency of your stool (feces):  IBS with diarrhea.  IBS with constipation.  Mixed IBS.  Unsubtyped IBS. It is important to know which type of IBS you have. Certain treatments are more likely to be helpful for certain types of IBS. What are the causes? The exact cause of IBS is not known. What increases the risk? You may have a higher risk for IBS if you:  Are female.  Are younger than 5740.  Have a family history  of IBS.  Have a mental health condition, such as depression, anxiety, or post-traumatic stress disorder.  Have had a bacterial infection of your GI tract. What are the signs or symptoms? Symptoms of IBS vary from person to person. The main symptom is abdominal pain or discomfort. Other symptoms usually include one or more of the following:  Diarrhea, constipation, or both.  Abdominal swelling or bloating.  Feeling full after eating a small or regular-sized meal.  Frequent gas.  Mucus in the stool.  A feeling of having more stool left after a bowel movement. Symptoms tend to come and go. They may be triggered by stress, mental health conditions, or certain foods. How is this diagnosed? This condition may be diagnosed based on a physical exam, your medical history, and your symptoms. You may have tests, such as:  Blood tests.  Stool test.  X-rays.  CT scan.  Colonoscopy. This is a procedure in which your GI tract is viewed with a long, thin, flexible tube. How is this treated? There is no cure for IBS, but treatment can help relieve symptoms. Treatment depends on the type of IBS you have, and may include:  Changes to your diet, such as: ? Avoiding foods that cause symptoms. ? Drinking more water. ? Following a low-FODMAP (fermentable oligosaccharides, disaccharides, monosaccharides, and polyols) diet for up to 6 weeks, or as told by your health care provider. FODMAPs are sugars that are hard for some people to digest. ? Eating more fiber. ? Eating  medium-sized meals at the same times every day.  Medicines. These may include: ? Fiber supplements, if you have constipation. ? Medicine to control diarrhea (antidiarrheal medicines). ? Medicine to help control muscle tightening (spasms) in your GI tract (antispasmodic medicines). ? Medicines to help with mental health conditions, such as antidepressants or tranquilizers.  Talk therapy or counseling.  Working with a diet and  nutrition specialist (dietitian) to help create a food plan that is right for you.  Managing your stress. Follow these instructions at home: Eating and drinking  Eat a healthy diet.  Eat medium-sized meals at about the same time every day. Do not eat large meals.  Gradually eat more fiber-rich foods. These include whole grains, fruits, and vegetables. This may be especially helpful if you have IBS with constipation.  Eat a diet low in FODMAPs.  Drink enough fluid to keep your urine pale yellow.  Keep a journal of foods that seem to trigger symptoms.  Avoid foods and drinks that: ? Contain added sugar. ? Make your symptoms worse. Dairy products, caffeinated drinks, and carbonated drinks can make symptoms worse for some people. General instructions  Take over-the-counter and prescription medicines and supplements only as told by your health care provider.  Get enough exercise. Do at least 150 minutes of moderate-intensity exercise each week.  Manage your stress. Getting enough sleep and exercise can help you manage stress.  Keep all follow-up visits as told by your health care provider and therapist. This is important. Alcohol Use  Do not drink alcohol if: ? Your health care provider tells you not to drink. ? You are pregnant, may be pregnant, or are planning to become pregnant.  If you drink alcohol, limit how much you have: ? 0-1 drink a day for women. ? 0-2 drinks a day for men.  Be aware of how much alcohol is in your drink. In the [place] one drink equals one typical bottle of beer (12 oz), one-half glass of wine (5 oz), or one shot of hard liquor (1 oz). Contact a health care provider if you have:  Constant pain.  Weight loss.  Difficulty or pain when swallowing.  Diarrhea that gets worse. Get help right away if you have:  Severe abdominal pain.  Fever.  Diarrhea with symptoms of dehydration, such as dizziness or dry mouth.  Bright red blood in your stool.   Stool that is black and tarry.  Abdominal swelling.  Vomiting that does not stop.  Blood in your vomit. Summary  Irritable bowel syndrome (IBS) is not one specific disease. It is a group of symptoms that affects digestion.  Your intestines may become more sensitive and overreact to certain things. This may be especially true when you eat certain foods or when you are under stress.  There is no cure for IBS, but treatment can help relieve symptoms. This information is not intended to replace advice given to you by your health care provider. Make sure you discuss any questions you have with your health care provider. Document Released: 09/12/2005 Document Revised: 09/05/2017 Document Reviewed: 09/05/2017 Elsevier Interactive Patient Education  2019 [employer].

## 2019-01-01 NOTE — Progress Notes (Signed)
Amanda Vazquez is a 28 y.o. female who presents to [facility]: Primary Care Sports Medicine today for abdominal pain  Left lower quadrant abdominal pain.  Symptoms started 3 to 4 days ago and are worsening.  Symptoms are associated with diarrhea.  She denies any fevers or chills body aches cough or congestion.  She notes a bit of runny nose that she thinks is attributable to seasonal allergies.  She notes her pain is consistent with severe episodes of her exacerbations of chronic pelvic pain her IBS flares.  She has tried some leftover tramadol which helped a little.  Additionally she is tried over-the-counter medications including ibuprofen which helped only a little.  She notes her symptoms are quite bad. No urinary frequency urgency dysuria or genital discharge.  Additionally she notes her anxiety has worsened recently.  She is very worried about her husband who is immune compromised and COVID-19.  ROS as above:  Exam:  BP 140/85    Pulse 89    Wt 118 lb (53.5 kg)    LMP 12/11/2014 (Approximate)    BMI 21.58 kg/m  Wt Readings from Last 5 Encounters:  01/01/19 118 lb (53.5 kg)  08/20/18 127 lb (57.6 kg)  07/18/18 132 lb (59.9 kg)  06/05/18 135 lb (61.2 kg)  05/08/18 140 lb (63.5 kg)    Gen: Well NAD HEENT: EOMI,  MMM clear nasal discharge. Lungs: Normal work of breathing. CTABL Heart: RRR no MRG Abd: NABS, Soft. Nondistended, tender palpation left lower quadrant. Exts: Brisk capillary refill, warm and well perfused.   Lab and Radiology Results Results for orders placed or performed in visit on 01/01/19 (from the past 72 hour(s))  POCT Urinalysis Dipstick     Status: None   Collection Time: 01/01/19  1:32 PM  Result Value Ref Range   Color, UA yellow    Clarity, UA clear    Glucose, UA Negative Negative   Bilirubin, UA NEGATIVE    Ketones, UA 15 MG    Spec Grav, UA 1.020 1.010 -  1.025   Blood, UA NEGATIVE    pH, UA 7.0 5.0 - 8.0   Protein, UA Negative Negative   Urobilinogen, UA 0.2 0.2 or 1.0 E.U./dL   Nitrite, UA NEGATIVE    Leukocytes, UA Negative Negative   Appearance     Odor     Dg Abd Acute W/chest  Result Date: 01/01/2019 CLINICAL DATA:  History of IBS with alternating constipation and diarrhea with abdominal pain, initial encounter EXAM: DG ABDOMEN ACUTE W/ 1V CHEST COMPARISON:  None. FINDINGS: Cardiac shadows within normal limits. The lungs are clear bilaterally. Scattered large and small bowel gas is noted. No obstructive changes are noted. No free air is seen. No acute bony abnormality is noted. IMPRESSION: Negative abdominal radiographs.  No acute cardiopulmonary disease. Electronically Signed   By: Alcide Clever M.D.   On: 01/01/2019 14:32    I personally (independently) visualized and performed the interpretation of the images attached in this note.   Assessment and Plan: 28 y.o. female with  Left lower quadrant abdominal pain.  Pain is likely due to flareup of her chronic pelvic pain or IBS pain.  She does not have any ovaries or fallopian tubes are a uterus to hurt and does not have any history of diverticulitis on her frequent pelvic or abdomen CT scans or ultrasounds.  I think it is reasonable to proceed with treatment for exacerbation of her chronic pain  while doing limited work-up.  X-ray obtained and was unremarkable.  Limited lab work-up is pending.  Plan for treatment with over-the-counter medications as well as her Lomotil and baclofen.  Recheck early tomorrow via my chart communication.  Will expand treatment options if needed.  PDMP not reviewed this encounter. Orders Placed This Encounter  Procedures   DG Abd Acute W/Chest    Standing Status:   Future    Number of Occurrences:   1    Standing Expiration Date:   03/02/2020    Order Specific Question:   Reason for Exam (SYMPTOM  OR DIAGNOSIS REQUIRED)    Answer:   abd pain    Order  Specific Question:   Is patient pregnant?    Answer:   No    Order Specific Question:   Preferred imaging location?    Answer:   [facility]    Order Specific Question:   Radiology Contrast Protocol - do NOT remove file path    Answer:   [email or web address]   CBC with Differential/Platelet   COMPLETE METABOLIC PANEL WITH GFR   POCT Urinalysis Dipstick   Meds ordered this encounter  Medications   norethindrone (AYGESTIN) 5 MG tablet    Sig: Take 1 tablet (5 mg total) by mouth daily for 30 days.    Dispense:  30 tablet    Refill:  0   diphenoxylate-atropine (LOMOTIL) 2.5-0.025 MG tablet    Sig: One to 2 tablets by mouth 4 times a day as needed for diarrhea.    Dispense:  30 tablet    Refill:  3   baclofen (LIORESAL) 10 MG tablet    Sig: Take 1 tablet (10 mg total) by mouth 3 (three) times daily as needed (abd spasm).    Dispense:  30 each    Refill:  1     Historical information moved to improve visibility of documentation.  Past Medical History:  Diagnosis Date   Common migraine with intractable migraine 12/16/2016   Endometriosis    Frequency of urination    Migraine    Pelvic pain in female    Polycystic ovary disease    Past Surgical History:  Procedure Laterality Date   ABDOMINAL HYSTERECTOMY     DX LAPAROSCOPY W/ LASER ABLATION OF ENDOMETRIOSIS  06-12-2014   [facility]   LAPAROSCOPIC OVARIAN CYSTECTOMY Left 01/08/2015   Procedure: LAPAROSCOPIC OVARIAN CYSTECTOMY;  Surgeon: Richardean Chimera, MD;  Location: [facility];  Service: Gynecology;  Laterality: Left;   LAPAROSCOPY N/A 01/08/2015   Procedure: LAPAROSCOPY DIAGNOSTIC;  Surgeon: Richardean Chimera, MD;  Location[facility];  Service: Gynecology;  Laterality: N/A;   TONSILLECTOMY  age 18   Social History   Tobacco Use   Smoking status: Never Smoker   Smokeless tobacco: Never Used  Substance Use Topics    Alcohol use: Yes    Comment: occasional   family history includes Alcohol abuse in her mother; Cancer in her maternal grandfather, maternal grandmother, and paternal grandfather; Diabetes in her maternal grandfather and sister; Hypertension in her maternal grandfather and paternal grandmother.  Medications: Current Outpatient Medications  Medication Sig Dispense Refill   ALPRAZolam (XANAX) 0.25 MG tablet TAKE 1 TABLET BY MOUTH TWICE DAILY AS NEEDED FOR ANXIETY 60 tablet 0   baclofen (LIORESAL) 10 MG tablet Take 1 tablet (10 mg total) by mouth 3 (three) times daily as needed (abd spasm). 30 each 1   bisacodyl (DULCOLAX) 10 MG suppository Place rectally.  buPROPion (WELLBUTRIN XL) 300 MG 24 hr tablet Take 1 tablet (300 mg total) by mouth every morning. 90 tablet 1   cloNIDine (CATAPRES) 0.2 MG tablet TAKE 1 TABLET(0.2 MG) BY MOUTH AT BEDTIME 90 tablet 5   cyclobenzaprine (FLEXERIL) 5 MG tablet   0   diazepam (VALIUM) 5 MG tablet PLACE 1 TABLET VAGINALLY THE NIGHT BEFORE PROCEDURE 5 tablet 0   ibuprofen (ADVIL,MOTRIN) 600 MG tablet Take by mouth.     ondansetron (ZOFRAN) 4 MG tablet Take 1 tablet (4 mg total) by mouth every 8 (eight) hours as needed for nausea or vomiting. 20 tablet 12   PARoxetine (PAXIL) 30 MG tablet Take 1 tablet (30 mg total) by mouth daily. 90 tablet 1   promethazine (PHENERGAN) 25 MG tablet TAKE 1 TABLET(25 MG) BY MOUTH EVERY 8 HOURS AS NEEDED FOR NAUSEA OR VOMITING 30 tablet 1   diphenoxylate-atropine (LOMOTIL) 2.5-0.025 MG tablet One to 2 tablets by mouth 4 times a day as needed for diarrhea. 30 tablet 3   norethindrone (AYGESTIN) 5 MG tablet Take 1 tablet (5 mg total) by mouth daily for 30 days. 30 tablet 0   No current facility-administered medications for this visit.    Allergies  Allergen Reactions   Buprenorphine Nausea Only     Discussed warning signs or symptoms. Please see discharge instructions. Patient expresses understanding.

## 2019-01-02 ENCOUNTER — Telehealth: Payer: Self-pay | Admitting: Family Medicine

## 2019-01-02 ENCOUNTER — Encounter: Payer: Self-pay | Admitting: Family Medicine

## 2019-01-02 ENCOUNTER — Other Ambulatory Visit: Payer: Self-pay | Admitting: Family Medicine

## 2019-01-02 LAB — CBC WITH DIFFERENTIAL/PLATELET
Absolute Monocytes: 441 cells/uL (ref 200–950)
Basophils Absolute: 23 cells/uL (ref 0–200)
Basophils Relative: 0.3 %
Eosinophils Absolute: 30 cells/uL (ref 15–500)
Eosinophils Relative: 0.4 %
HCT: 38.1 % (ref 35.0–45.0)
Hemoglobin: 13.4 g/dL (ref 11.7–15.5)
Lymphs Abs: 1763 cells/uL (ref 850–3900)
MCH: 30.4 pg (ref 27.0–33.0)
MCHC: 35.2 g/dL (ref 32.0–36.0)
MCV: 86.4 fL (ref 80.0–100.0)
MPV: 10.3 fL (ref 7.5–12.5)
Monocytes Relative: 5.8 %
Neutro Abs: 5343 cells/uL (ref 1500–7800)
Neutrophils Relative %: 70.3 %
Platelets: 382 10*3/uL (ref 140–400)
RBC: 4.41 10*6/uL (ref 3.80–5.10)
RDW: 13 % (ref 11.0–15.0)
Total Lymphocyte: 23.2 %
WBC: 7.6 10*3/uL (ref 3.8–10.8)

## 2019-01-02 LAB — COMPLETE METABOLIC PANEL WITH GFR
AG Ratio: 1.8 (calc) (ref 1.0–2.5)
ALT: 15 U/L (ref 6–29)
AST: 20 U/L (ref 10–30)
Albumin: 5.1 g/dL (ref 3.6–5.1)
Alkaline phosphatase (APISO): 69 U/L (ref 31–125)
BUN: 15 mg/dL (ref 7–25)
CO2: 27 mmol/L (ref 20–32)
Calcium: 10.4 mg/dL — ABNORMAL HIGH (ref 8.6–10.2)
Chloride: 98 mmol/L (ref 98–110)
Creat: 0.72 mg/dL (ref 0.50–1.10)
GFR, Est African American: 132 mL/min/{1.73_m2} (ref >=60)
GFR, Est Non African American: 114 mL/min/{1.73_m2} (ref >=60)
Globulin: 2.8 g/dL (calc) (ref 1.9–3.7)
Glucose, Bld: 105 mg/dL — ABNORMAL HIGH (ref 65–99)
Potassium: 4 mmol/L (ref 3.5–5.3)
Sodium: 140 mmol/L (ref 135–146)
Total Bilirubin: 0.7 mg/dL (ref 0.2–1.2)
Total Protein: 7.9 g/dL (ref 6.1–8.1)

## 2019-01-02 MED ORDER — GENERIC EXTERNAL MEDICATION
Status: DC
Start: ? — End: 2019-01-02

## 2019-01-02 MED ORDER — SODIUM CHLORIDE 0.9 % IV SOLN
10.00 | INTRAVENOUS | Status: DC
Start: ? — End: 2019-01-02

## 2019-01-02 MED ORDER — HYDROCODONE-ACETAMINOPHEN 5-325 MG PO TABS: 1.0000 | ORAL_TABLET | Freq: Four times a day (QID) | ORAL | 0 refills | Status: DC | PRN

## 2019-01-02 NOTE — Telephone Encounter (Signed)
I sent in 10 pills of hydrocodone 5 to the [facility] pharmacy in [place].  I think we both understand that these are profoundly unusual times due to the COVID-19.  I do not intend to continue to prescribe narcotic pain medications in the future and this is a very special circumstance.  I hope you feel better and please stay safe.  I am here for you if you need me.  Clayburn Pert

## 2019-01-02 NOTE — Telephone Encounter (Signed)
Pt called and stated that she had picked up her pain medication that was called into pharmacy. Pt states she feels like the pain is worse even with pain medication. Pt states ,she passed out last night and also again this morning. Pt states she is eating and drinking OK. Pt states she also feels like heart is racing. She is wanting to know if she should come back in or go to hospital. Advised patient we would return call at [phone]. Will forward message to provider.

## 2019-01-02 NOTE — Telephone Encounter (Signed)
Spoke with patient and advised her per Dr. Denyse Amass , pt should be evaluated at ED. Pt expressed understanding and will go to ED.

## 2019-01-07 ENCOUNTER — Other Ambulatory Visit: Payer: Self-pay

## 2019-01-07 ENCOUNTER — Encounter: Payer: Self-pay | Admitting: Family Medicine

## 2019-01-07 ENCOUNTER — Ambulatory Visit (INDEPENDENT_AMBULATORY_CARE_PROVIDER_SITE_OTHER): Payer: Self-pay

## 2019-01-07 ENCOUNTER — Ambulatory Visit (INDEPENDENT_AMBULATORY_CARE_PROVIDER_SITE_OTHER): Payer: Self-pay | Admitting: Family Medicine

## 2019-01-07 VITALS — BP 90/63 | HR 57 | Wt 116.0 lb

## 2019-01-07 DIAGNOSIS — E876 Hypokalemia: Secondary | ICD-10-CM

## 2019-01-07 DIAGNOSIS — I878 Other specified disorders of veins: Secondary | ICD-10-CM

## 2019-01-07 DIAGNOSIS — R109 Unspecified abdominal pain: Secondary | ICD-10-CM

## 2019-01-07 DIAGNOSIS — R197 Diarrhea, unspecified: Secondary | ICD-10-CM

## 2019-01-07 DIAGNOSIS — R112 Nausea with vomiting, unspecified: Secondary | ICD-10-CM

## 2019-01-07 DIAGNOSIS — K582 Mixed irritable bowel syndrome: Secondary | ICD-10-CM

## 2019-01-07 LAB — POCT URINALYSIS DIPSTICK
Blood, UA: NEGATIVE
Glucose, UA: NEGATIVE
Ketones, UA: NEGATIVE
Nitrite, UA: POSITIVE
Protein, UA: POSITIVE — AB
Spec Grav, UA: 1.025 (ref 1.010–1.025)
Urobilinogen, UA: 0.2 E.U./dL
pH, UA: 6 (ref 5.0–8.0)

## 2019-01-07 MED ORDER — HYDROCODONE-ACETAMINOPHEN 5-325 MG PO TABS: 1.0000 | ORAL_TABLET | Freq: Four times a day (QID) | ORAL | 0 refills | Status: DC | PRN

## 2019-01-07 MED ORDER — IOHEXOL 300 MG/ML  SOLN
100.0000 mL | Freq: Once | INTRAMUSCULAR | Status: AC | PRN
  Administered 2019-01-07: 12:00:00 100 mL via INTRAVENOUS

## 2019-01-07 MED ORDER — IOHEXOL 300 MG/ML  SOLN
30.0000 mL | Freq: Once | INTRAMUSCULAR | Status: AC | PRN
  Administered 2019-01-07: 30 mL via ORAL

## 2019-01-07 MED ORDER — SODIUM CHLORIDE 0.9 % IV SOLN: 4.0000 mg | Freq: Once | INTRAVENOUS | Status: DC

## 2019-01-07 NOTE — Progress Notes (Signed)
Subjective:    CC: Poor venous access  HPI: This is a patient with abdominal pain and difficult veins, her primary treating provider has suggested that she needs IV fluids.  I am called for further evaluation and definitive treatment regarding this.  I reviewed the past medical history, family history, social history, surgical history, and allergies today and no changes were needed.  Please see the problem list section below in epic for further details.  Past Medical History: Past Medical History:  Diagnosis Date  . Common migraine with intractable migraine 12/16/2016  . Endometriosis   . Frequency of urination   . Migraine   . Pelvic pain in female   . Polycystic ovary disease    Past Surgical History: Past Surgical History:  Procedure Laterality Date  . ABDOMINAL HYSTERECTOMY    . DX LAPAROSCOPY W/ LASER ABLATION OF ENDOMETRIOSIS  06-12-2014   [facility]  . LAPAROSCOPIC OVARIAN CYSTECTOMY Left 01/08/2015   Procedure: LAPAROSCOPIC OVARIAN CYSTECTOMY;  Surgeon: Richardean ChimeraJohn McComb, MD;  [facility] [place];  Service: Gynecology;  Laterality: Left;  . LAPAROSCOPY N/A 01/08/2015   Procedure: LAPAROSCOPY DIAGNOSTIC;  Surgeon: Richardean ChimeraJohn McComb, MD;  [facility] [place];  Service: Gynecology;  Laterality: N/A;  . TONSILLECTOMY  age 28   Social History: Social History   Socioeconomic History  . Marital status: Single    Spouse name: Not on file  . Number of children: 0  . Years of education: 6512  . Highest education level: Not on file  Occupational History  . Not on file  Social Needs  . Financial resource strain: Not on file  . Food insecurity:    Worry: Not on file    Inability: Not on file  . Transportation needs:    Medical: Not on file    Non-medical: Not on file  Tobacco Use  . Smoking status: Never Smoker  . Smokeless tobacco: Never Used  Substance and Sexual Activity  . Alcohol use: Yes    Comment: occasional  . Drug use: No   . Sexual activity: Yes    Birth control/protection: Pill, None  Lifestyle  . Physical activity:    Days per week: Not on file    Minutes per session: Not on file  . Stress: Not on file  Relationships  . Social connections:    Talks on phone: Not on file    Gets together: Not on file    Attends religious service: Not on file    Active member of club or organization: Not on file    Attends meetings of clubs or organizations: Not on file    Relationship status: Not on file  Other Topics Concern  . Not on file  Social History Narrative   Lives w/ mother    Caffeine use: Coffee once in awhile   Right-handed   Family History: Family History  Problem Relation Age of Onset  . Diabetes Sister   . Diabetes Maternal Grandfather   . Hypertension Maternal Grandfather   . Cancer Maternal Grandfather   . Hypertension Paternal Grandmother   . Alcohol abuse Mother   . Cancer Maternal Grandmother   . Cancer Paternal Grandfather   . Heart attack Neg Hx    Allergies: Allergies  Allergen Reactions  . Buprenorphine Nausea Only   Medications: See med rec.  Review of Systems: No fevers, chills, night sweats, weight loss, chest pain, or shortness of breath.   Objective:    General: Well Developed,  well nourished, and in no acute distress.  Neuro: Alert and oriented x3, extra-ocular muscles intact, sensation grossly intact.  HEENT: Normocephalic, atraumatic, pupils equal round reactive to light, neck supple, no masses, no lymphadenopathy, thyroid nonpalpable.  Skin: Warm and dry, no rashes. Cardiac: Regular rate and rhythm, no murmurs rubs or gallops, no lower extremity edema.  Respiratory: Clear to auscultation bilaterally. Not using accessory muscles, speaking in full sentences.  20-gauge angiocatheter placed in the right cephalic vein at the level of the elbow.  Flushed easily, and normal saline is being run.  Impression and Recommendations:    Poor venous access 20-gauge  angiocatheter placed in the right cubital vein. She will need a CT, contrast can be placed through this access as well. Further fluid management per primary treating provider.   ___________________________________________ Ihor Austin. Benjamin Stain, M.D., ABFM., CAQSM. Primary Care and Sports Medicine [facility]  Adjunct Professor of Family Medicine  [employer]

## 2019-01-07 NOTE — Progress Notes (Signed)
Amanda Vazquez is a 28 y.o. female who presents to [facility] today for follow-up abdominal pain.  Suttyn was seen for abdominal pain on April 7.  X-ray at that time was unremarkable as were labs.  The next day she worsened and developed chest pain and lightheadedness.  She was seen in the emergency department chest x-ray was unremarkable labs showed mild hypokalemia hypercalcemia as well as a possible urinary tract infection.  She was discharged on the eighth and is here today for follow-up.  She notes continued abdominal pain as well as chest pain.  She notes she feels often lightheadedness and dizziness.  She notes that she has been eating and drinking a lot less than usual.  She is still passing gas per rectum and urinating normally.  She notes she feels very nauseated but is not vomiting.  She no longer has any diarrhea and is taking Lomotil as prescribed last time.   ROS as above:  Exam:  BP 90/63   Pulse (!) 57   Wt 116 lb (52.6 kg)   LMP 12/11/2014 (Approximate)   BMI 21.22 kg/m  Wt Readings from Last 5 Encounters:  01/07/19 116 lb (52.6 kg)  01/01/19 118 lb (53.5 kg)  08/20/18 127 lb (57.6 kg)  07/18/18 132 lb (59.9 kg)  06/05/18 135 lb (61.2 kg)    Gen: Uncomfortable appearing but no acute distress HEENT: EOMI,  MMM Lungs: Normal work of breathing. CTABL Heart: RRR no MRG Chest wall: Tender palpation left chest wall and posterior chest wall. Abd: NABS, Soft. Nondistended, tender to palpation diffusely with rebound and guarding present. Exts: Brisk capillary refill, warm and well perfused.   Lab and Radiology Results Results for orders placed or performed in visit on 01/07/19 (from the past 72 hour(s))  POCT Urinalysis Dipstick     Status: Abnormal   Collection Time: 01/07/19  9:52 AM  Result Value Ref Range   Color, UA yellow    Clarity, UA clear     Glucose, UA Negative Negative   Bilirubin, UA small    Ketones, UA negative    Spec Grav, UA 1.025 1.010 - 1.025   Blood, UA negative    pH, UA 6.0 5.0 - 8.0   Protein, UA Positive (A) Negative   Urobilinogen, UA 0.2 0.2 or 1.0 E.U./dL   Nitrite, UA positive    Leukocytes, UA Small (1+) (A) Negative   Appearance     Odor     Ct Abdomen Pelvis W Contrast  Result Date: 01/07/2019 CLINICAL DATA:  Diffuse abdominal pain for 1 week. Nausea, vomiting, and diarrhea. EXAM: CT ABDOMEN AND PELVIS WITH CONTRAST TECHNIQUE: Multidetector CT imaging of the abdomen and pelvis was performed using the standard protocol following bolus administration of intravenous contrast. CONTRAST:  100mL OMNIPAQUE IOHEXOL 300 MG/ML  SOLN COMPARISON:  01/31/2016 from [facility] FINDINGS: Lower Chest: No acute findings. Hepatobiliary: No hepatic masses identified. Gallbladder is unremarkable. Pancreas:  No mass or inflammatory changes. Spleen: Within normal limits in size and appearance. Adrenals/Urinary Tract: No masses identified. No evidence of hydronephrosis. Stomach/Bowel: No evidence of obstruction, inflammatory process or abnormal fluid collections. Vascular/Lymphatic: No pathologically enlarged lymph nodes. No abdominal aortic aneurysm. Reproductive: Prior hysterectomy noted. Adnexal regions are unremarkable in appearance. Other:  None. Musculoskeletal:  No suspicious bone lesions identified. IMPRESSION: Negative. No acute findings or other significant abnormality. Electronically Signed   By: Alver SorrowJohn  Stahl M.D.  On: 01/07/2019 12:49   I personally (independently) visualized and performed the interpretation of the images attached in this note.   Patient additionally was given 1 L normal saline IV prior to CT scan as well as 4 mg IV Zofran prior to CT scan.   Assessment and Plan: 28 y.o. female with abdominal pain.  Fortunately CT scan was normal. I think is probably IBS flareup.  Plan to start advancing  diet as tolerated and stopping Lomotil.  Very limited Norco for severe pain however will start backing off of that soon.    Additionally reasonable to check PTH and ionized calcium to follow-up mildly elevated calcium seen previously.  Additionally will reasonable to recheck basic metabolic panel to follow-up hypokalemia of seen previously.  Hypotension and lightheadedness likely due to dehydration as well as clonidine.  Discontinue clonidine.  IV fluids given as above.  Advance diet as tolerated.  PDMP reviewed during this encounter. Orders Placed This Encounter  Procedures  . CT Abdomen Pelvis W Contrast    Standing Status:   Future    Number of Occurrences:   1    Standing Expiration Date:   04/07/2020    Order Specific Question:   If indicated for the ordered procedure, I authorize the administration of contrast media per Radiology protocol    Answer:   Yes    Order Specific Question:   Is patient pregnant?    Answer:   No    Order Specific Question:   Preferred imaging location?    Answer:   [facility]    Order Specific Question:   Is Oral Contrast requested for this exam?    Answer:   Yes, Per Radiology protocol    Order Specific Question:   Radiology Contrast Protocol - do NOT remove file path    Answer:   \\[email or web address]  . PTH, Intact and Calcium  . BASIC METABOLIC PANEL WITH GFR  . POCT Urinalysis Dipstick   Meds ordered this encounter  Medications  . ondansetron (ZOFRAN) 4 mg in sodium chloride 0.9 % 50 mL IVPB  . HYDROcodone-acetaminophen (NORCO/VICODIN) 5-325 MG tablet    Sig: Take 1 tablet by mouth every 6 (six) hours as needed.    Dispense:  10 tablet    Refill:  0     Historical information moved to improve visibility of documentation.  Past Medical History:  Diagnosis Date  . Common migraine with intractable migraine 12/16/2016  . Endometriosis   . Frequency of urination   . Migraine   . Pelvic pain in female   .  Polycystic ovary disease    Past Surgical History:  Procedure Laterality Date  . ABDOMINAL HYSTERECTOMY    . DX LAPAROSCOPY W/ LASER ABLATION OF ENDOMETRIOSIS  06-12-2014   [facility]  . LAPAROSCOPIC OVARIAN CYSTECTOMY Left 01/08/2015   Procedure: LAPAROSCOPIC OVARIAN CYSTECTOMY;  Surgeon: Richardean Chimera, MD;  Location: [facility];  Service: Gynecology;  Laterality: Left;  . LAPAROSCOPY N/A 01/08/2015   Procedure: LAPAROSCOPY DIAGNOSTIC;  Surgeon: Richardean Chimera, MD;  Location: [facility];  Service: Gynecology;  Laterality: N/A;  . TONSILLECTOMY  age 55   Social History   Tobacco Use  . Smoking status: Never Smoker  . Smokeless tobacco: Never Used  Substance Use Topics  . Alcohol use: Yes    Comment: occasional   family history includes Alcohol abuse in her mother; Cancer in her maternal grandfather, maternal grandmother, and paternal grandfather; Diabetes in her maternal  grandfather and sister; Hypertension in her maternal grandfather and paternal grandmother.  Medications: Current Outpatient Medications  Medication Sig Dispense Refill  . ALPRAZolam (XANAX) 0.25 MG tablet TAKE 1 TABLET BY MOUTH TWICE DAILY AS NEEDED FOR ANXIETY 60 tablet 1  . baclofen (LIORESAL) 10 MG tablet Take 1 tablet (10 mg total) by mouth 3 (three) times daily as needed (abd spasm). 30 each 1  . bisacodyl (DULCOLAX) 10 MG suppository Place rectally.    Marland Kitchen buPROPion (WELLBUTRIN XL) 300 MG 24 hr tablet Take 1 tablet (300 mg total) by mouth every morning. 90 tablet 1  . cloNIDine (CATAPRES) 0.2 MG tablet TAKE 1 TABLET(0.2 MG) BY MOUTH AT BEDTIME 90 tablet 5  . cyclobenzaprine (FLEXERIL) 5 MG tablet   0  . diazepam (VALIUM) 5 MG tablet PLACE 1 TABLET VAGINALLY THE NIGHT BEFORE PROCEDURE 5 tablet 0  . HYDROcodone-acetaminophen (NORCO/VICODIN) 5-325 MG tablet Take 1 tablet by mouth every 6 (six) hours as needed. 10 tablet 0  . ibuprofen (ADVIL,MOTRIN) 600 MG tablet Take by  mouth.    . norethindrone (AYGESTIN) 5 MG tablet Take 1 tablet (5 mg total) by mouth daily for 30 days. 30 tablet 0  . ondansetron (ZOFRAN) 4 MG tablet Take 1 tablet (4 mg total) by mouth every 8 (eight) hours as needed for nausea or vomiting. 20 tablet 12  . PARoxetine (PAXIL) 30 MG tablet Take 1 tablet (30 mg total) by mouth daily. 90 tablet 1  . promethazine (PHENERGAN) 25 MG tablet TAKE 1 TABLET(25 MG) BY MOUTH EVERY 8 HOURS AS NEEDED FOR NAUSEA OR VOMITING 30 tablet 1   Current Facility-Administered Medications  Medication Dose Route Frequency Provider Last Rate Last Dose  . ondansetron (ZOFRAN) 4 mg in sodium chloride 0.9 % 50 mL IVPB  4 mg Intravenous Once Rodolph Bong, MD       Allergies  Allergen Reactions  . Buprenorphine Nausea Only     Discussed warning signs or symptoms. Please see discharge instructions. Patient expresses understanding.

## 2019-01-07 NOTE — Patient Instructions (Signed)
Thank you for coming in today. Take omeprazole (prilosec) for possible heartburn.  Use norco sparingly If not improving get labs STOP lomotil and try to eat food with fiber.  STOP clonidine for a while for blood pressure being low.

## 2019-01-07 NOTE — Assessment & Plan Note (Signed)
20-gauge angiocatheter placed in the right cubital vein. She will need a CT, contrast can be placed through this access as well. Further fluid management per primary treating provider.

## 2019-01-07 NOTE — Telephone Encounter (Signed)
Patient scheduled.

## 2019-01-08 ENCOUNTER — Emergency Department (HOSPITAL_COMMUNITY)
Admission: EM | Admit: 2019-01-08 | Discharge: 2019-01-08 | Disposition: A | Discharge status: Home / Self Care | Payer: Self-pay | Attending: Emergency Medicine | Admitting: Emergency Medicine

## 2019-01-08 ENCOUNTER — Other Ambulatory Visit: Payer: Self-pay

## 2019-01-08 ENCOUNTER — Encounter: Payer: Self-pay | Admitting: Family Medicine

## 2019-01-08 ENCOUNTER — Ambulatory Visit (INDEPENDENT_AMBULATORY_CARE_PROVIDER_SITE_OTHER): Payer: Self-pay | Admitting: Family Medicine

## 2019-01-08 ENCOUNTER — Encounter (HOSPITAL_COMMUNITY): Payer: Self-pay | Admitting: *Deleted

## 2019-01-08 VITALS — BP 133/75 | HR 119 | Temp 99.0°F | Wt 115.0 lb

## 2019-01-08 DIAGNOSIS — Z20828 Contact with and (suspected) exposure to other viral communicable diseases: Secondary | ICD-10-CM | POA: Insufficient documentation

## 2019-01-08 DIAGNOSIS — R112 Nausea with vomiting, unspecified: Secondary | ICD-10-CM

## 2019-01-08 DIAGNOSIS — B349 Viral infection, unspecified: Secondary | ICD-10-CM | POA: Insufficient documentation

## 2019-01-08 DIAGNOSIS — Z20822 Contact with and (suspected) exposure to covid-19: Secondary | ICD-10-CM

## 2019-01-08 DIAGNOSIS — R109 Unspecified abdominal pain: Secondary | ICD-10-CM

## 2019-01-08 DIAGNOSIS — R6889 Other general symptoms and signs: Secondary | ICD-10-CM

## 2019-01-08 LAB — URINALYSIS, ROUTINE W REFLEX MICROSCOPIC
Bilirubin Urine: NEGATIVE
Glucose, UA: NEGATIVE mg/dL
Hgb urine dipstick: NEGATIVE
Ketones, ur: 80 mg/dL — AB
Leukocytes,Ua: NEGATIVE
Nitrite: NEGATIVE
Protein, ur: NEGATIVE mg/dL
Specific Gravity, Urine: 1.015 (ref 1.005–1.030)
pH: 5 (ref 5.0–8.0)

## 2019-01-08 LAB — CBC WITH DIFFERENTIAL/PLATELET
Abs Immature Granulocytes: 0.03 10*3/uL (ref 0.00–0.07)
Basophils Absolute: 0 10*3/uL (ref 0.0–0.1)
Basophils Relative: 0 %
Eosinophils Absolute: 0 10*3/uL (ref 0.0–0.5)
Eosinophils Relative: 0 %
HCT: 38.2 % (ref 36.0–46.0)
Hemoglobin: 13 g/dL (ref 12.0–15.0)
Immature Granulocytes: 0 %
Lymphocytes Relative: 21 %
Lymphs Abs: 1.5 10*3/uL (ref 0.7–4.0)
MCH: 29.9 pg (ref 26.0–34.0)
MCHC: 34 g/dL (ref 30.0–36.0)
MCV: 87.8 fL (ref 80.0–100.0)
Monocytes Absolute: 0.3 10*3/uL (ref 0.1–1.0)
Monocytes Relative: 5 %
Neutro Abs: 5.3 10*3/uL (ref 1.7–7.7)
Neutrophils Relative %: 74 %
Platelets: 357 10*3/uL (ref 150–400)
RBC: 4.35 MIL/uL (ref 3.87–5.11)
RDW: 13.3 % (ref 11.5–15.5)
WBC: 7.2 10*3/uL (ref 4.0–10.5)
nRBC: 0 % (ref 0.0–0.2)

## 2019-01-08 LAB — COMPREHENSIVE METABOLIC PANEL
ALT: 13 U/L (ref 0–44)
AST: 18 U/L (ref 15–41)
Albumin: 5.2 g/dL — ABNORMAL HIGH (ref 3.5–5.0)
Alkaline Phosphatase: 53 U/L (ref 38–126)
Anion gap: 14 (ref 5–15)
BUN: 13 mg/dL (ref 6–20)
CO2: 23 mmol/L (ref 22–32)
Calcium: 9.5 mg/dL (ref 8.9–10.3)
Chloride: 104 mmol/L (ref 98–111)
Creatinine, Ser: 0.74 mg/dL (ref 0.44–1.00)
GFR calc Af Amer: >60 mL/min (ref >60)
GFR calc non Af Amer: >60 mL/min (ref >60)
Glucose, Bld: 93 mg/dL (ref 70–99)
Potassium: 3.3 mmol/L — ABNORMAL LOW (ref 3.5–5.1)
Sodium: 141 mmol/L (ref 135–145)
Total Bilirubin: 0.9 mg/dL (ref 0.3–1.2)
Total Protein: 8 g/dL (ref 6.5–8.1)

## 2019-01-08 LAB — LACTATE DEHYDROGENASE: LDH: 152 U/L (ref 98–192)

## 2019-01-08 LAB — I-STAT BETA HCG BLOOD, ED (MC, WL, AP ONLY): I-stat hCG, quantitative: 6.4 m[IU]/mL — ABNORMAL HIGH (ref <5)

## 2019-01-08 LAB — C-REACTIVE PROTEIN: CRP: <0.8 mg/dL (ref <1.0)

## 2019-01-08 LAB — SARS CORONAVIRUS 2 BY RT PCR (HOSPITAL ORDER, PERFORMED IN ~~LOC~~ HOSPITAL LAB): SARS Coronavirus 2: NEGATIVE

## 2019-01-08 MED ORDER — OXYCODONE-ACETAMINOPHEN 5-325 MG PO TABS
1.0000 | ORAL_TABLET | Freq: Once | ORAL | Status: AC
  Administered 2019-01-08: 1 via ORAL
  Filled 2019-01-08: qty 1

## 2019-01-08 MED ORDER — LACTATED RINGERS IV BOLUS
1500.0000 mL | Freq: Once | INTRAVENOUS | Status: AC
  Administered 2019-01-08: 1500 mL via INTRAVENOUS

## 2019-01-08 MED ORDER — MORPHINE SULFATE (PF) 4 MG/ML IV SOLN
6.0000 mg | Freq: Once | INTRAVENOUS | Status: AC
  Administered 2019-01-08: 6 mg via INTRAVENOUS
  Filled 2019-01-08: qty 2

## 2019-01-08 MED ORDER — ACETAMINOPHEN 325 MG RE SUPP: 650.0000 mg | Freq: Three times a day (TID) | RECTAL | 0 refills | Status: DC | PRN

## 2019-01-08 MED ORDER — PROMETHAZINE HCL 25 MG RE SUPP: 25.0000 mg | Freq: Four times a day (QID) | RECTAL | 0 refills | Status: DC | PRN

## 2019-01-08 NOTE — ED Notes (Signed)
Discharge paperwork reviewed with pt, including prescriptions.  Pt voiced understanding, with no further questions.  Pt ambulatory at discharge. Husband waiting to pick up pt.

## 2019-01-08 NOTE — ED Triage Notes (Signed)
Pt sent from PCP for vomiting, diarhhea, abdominal pain that has not been relieved with her oral medications. Pt also has had fever, chills, cough, since last night. PCP was concerned for possible covid 19.

## 2019-01-08 NOTE — Discharge Instructions (Addendum)
Tested in the ER for COVID-19. Results have come back negative.  We suspect that you have a viral illness.  Please follow-up with your primary care doctor for appropriate symptom management. We still recommend that you self quarantine yourself for a total of 14 days since the symptoms began or for 3 straight days of having no fevers or respiratory complaints.  Please consider taking rectal promethazine and Tylenol for nausea and pain if you are unable to tolerate medicines by mouth. Your lab results are not indicating any severe electrolyte abnormalities damage.

## 2019-01-08 NOTE — ED Notes (Signed)
Pt provided ginger ale with PO meds. Pt able to tolerate PO intake with oral meds, no complaints.

## 2019-01-08 NOTE — Patient Instructions (Signed)
Thank you for coming in today. Go to [facility].  They are waiting for you.   [address] [place], [place], [place] [address]

## 2019-01-08 NOTE — Telephone Encounter (Signed)
Patient scheduled.

## 2019-01-08 NOTE — ED Provider Notes (Addendum)
[facility]-EMERGENCY DEPT Provider Note   CSN: [id number] Arrival date & time: 01/08/19  0950    History   Chief Complaint Chief Complaint  Patient presents with  . Abdominal Pain  . Emesis  . Cough  . Fever    HPI Amanda Vazquez is a 28 y.o. female.     HPI 28 year old female comes in a chief complaint of abdominal pain, vomiting, cough and fevers..  Patient has history of chronic pain syndrome and uncomplicated opioid dependence.  She also has history of endometriosis, polycystic ovarian disease.  According to the patient she has been unwell for the last 1 week.  Her symptoms include chest pain, shortness of breath, nonproductive cough, body aches, fevers, chills, sweats and weakness.  Her symptoms have progressed over the last week.  Patient has been to his PCP 3 times and went to [facility] on one occasion. Her lab work-up thus far has been unremarkable and a CT scan was negative for acute process.  She reports that she went to her PCP today and she was advised to come to the ER for further treatment.  Patient reports that her T-max is 102.  She states that her husband is working and going in and out of her house, but she has been Engineer, water.  Past Medical History:  Diagnosis Date  . Common migraine with intractable migraine 12/16/2016  . Endometriosis   . Frequency of urination   . Migraine   . Pelvic pain in female   . Polycystic ovary disease     Patient Active Problem List   Diagnosis Date Noted  . Poor venous access 01/07/2019  . Uncomplicated opioid dependence (HCC) 05/26/2017  . Chronic pain syndrome 05/17/2017  . Irritable bowel syndrome 01/27/2017  . Generalized anxiety disorder 12/26/2016  . MDD (major depressive disorder), recurrent episode, severe (HCC) 12/26/2016  . Surgical menopause 11/14/2016  . Dyspareunia, female 11/14/2016  . Headache disorder 11/14/2016  . Constipation 11/14/2016  . Chronic  pelvic pain in female 03/26/2015    Past Surgical History:  Procedure Laterality Date  . ABDOMINAL HYSTERECTOMY    . DX LAPAROSCOPY W/ LASER ABLATION OF ENDOMETRIOSIS  06-12-2014   [facility]  . LAPAROSCOPIC OVARIAN CYSTECTOMY Left 01/08/2015   Procedure: LAPAROSCOPIC OVARIAN CYSTECTOMY;  Surgeon: Richardean Chimera, MD;  Location: [facility];  Service: Gynecology;  Laterality: Left;  . LAPAROSCOPY N/A 01/08/2015   Procedure: LAPAROSCOPY DIAGNOSTIC;  Surgeon: Richardean Chimera, MD;  Location: [facility];  Service: Gynecology;  Laterality: N/A;  . TONSILLECTOMY  age 37     OB History    Gravida  1   Para      Term      Preterm      AB  1   Living        SAB  1   TAB      Ectopic      Multiple      Live Births               Home Medications    Prior to Admission medications   Medication Sig Start Date End Date Taking? Authorizing Provider  ALPRAZolam (XANAX) 0.25 MG tablet TAKE 1 TABLET BY MOUTH TWICE DAILY AS NEEDED FOR ANXIETY Patient taking differently: Take 0.25 mg by mouth 2 (two) times daily as needed for anxiety. TAKE 1 TABLET BY MOUTH TWICE DAILY AS NEEDED FOR ANXIETY 01/03/19  Yes Rodolph Bong, MD  baclofen (LIORESAL) 10 MG tablet Take 1 tablet (10 mg total) by mouth 3 (three) times daily as needed (abd spasm). 01/01/19  Yes Rodolph Bong, MD  HYDROcodone-acetaminophen (NORCO/VICODIN) 5-325 MG tablet Take 1 tablet by mouth every 6 (six) hours as needed. 01/07/19  Yes Rodolph Bong, MD  ibuprofen (ADVIL,MOTRIN) 800 MG tablet Take 800 mg by mouth every 8 (eight) hours as needed for headache or moderate pain.   Yes [provider]  loperamide (IMODIUM) 2 MG capsule Take 2 mg by mouth as needed for diarrhea or loose stools.   Yes [provider]  promethazine (PHENERGAN) 25 MG tablet TAKE 1 TABLET(25 MG) BY MOUTH EVERY 8 HOURS AS NEEDED FOR NAUSEA OR VOMITING Patient taking differently: Take 25 mg by mouth every 8  (eight) hours as needed for nausea or vomiting. TAKE 1 TABLET(25 MG) BY MOUTH EVERY 8 HOURS AS NEEDED FOR NAUSEA OR VOMITING 10/22/18  Yes Rodolph Bong, MD  acetaminophen (TYLENOL) 325 MG suppository Place 2 suppositories (650 mg total) rectally every 8 (eight) hours as needed for moderate pain. 01/08/19   Derwood Kaplan, MD  buPROPion (WELLBUTRIN XL) 300 MG 24 hr tablet Take 1 tablet (300 mg total) by mouth every morning. Patient not taking: Reported on 01/08/2019 07/18/18   Rodolph Bong, MD  cloNIDine (CATAPRES) 0.2 MG tablet TAKE 1 TABLET(0.2 MG) BY MOUTH AT BEDTIME Patient not taking: TAKE 1 TABLET(0.2 MG) BY MOUTH AT BEDTIME 12/12/18   Rodolph Bong, MD  diazepam (VALIUM) 5 MG tablet PLACE 1 TABLET VAGINALLY THE NIGHT BEFORE PROCEDURE Patient not taking: Reported on 01/08/2019 06/05/18   Rodolph Bong, MD  norethindrone (AYGESTIN) 5 MG tablet Take 1 tablet (5 mg total) by mouth daily for 30 days. Patient not taking: Reported on 01/08/2019 01/01/19 01/31/19  Rodolph Bong, MD  ondansetron (ZOFRAN) 4 MG tablet Take 1 tablet (4 mg total) by mouth every 8 (eight) hours as needed for nausea or vomiting. Patient not taking: Reported on 01/08/2019 11/07/18   Rodolph Bong, MD  PARoxetine (PAXIL) 30 MG tablet Take 1 tablet (30 mg total) by mouth daily. Patient not taking: Reported on 01/08/2019 07/18/18   Rodolph Bong, MD  promethazine (PHENERGAN) 25 MG suppository Place 1 suppository (25 mg total) rectally every 6 (six) hours as needed for nausea. 01/08/19   Derwood Kaplan, MD  clonazePAM (KLONOPIN) 0.5 MG tablet Take 1 tablet (0.5 mg total) by mouth 2 (two) times daily as needed for anxiety (severe anxiety). 09/21/17 09/29/17  Rodolph Bong, MD    Family History Family History  Problem Relation Age of Onset  . Diabetes Sister   . Diabetes Maternal Grandfather   . Hypertension Maternal Grandfather   . Cancer Maternal Grandfather   . Hypertension Paternal Grandmother   . Alcohol abuse Mother   . Cancer  Maternal Grandmother   . Cancer Paternal Grandfather   . Heart attack Neg Hx     Social History Social History   Tobacco Use  . Smoking status: Never Smoker  . Smokeless tobacco: Never Used  Substance Use Topics  . Alcohol use: Yes    Comment: occasional  . Drug use: No     Allergies   Buprenorphine   Review of Systems Review of Systems  Constitutional: Positive for activity change, chills and fever.  Respiratory: Positive for cough and shortness of breath.   Cardiovascular: Positive for chest pain and palpitations.  Gastrointestinal: Positive for abdominal distention, nausea and  vomiting. Negative for diarrhea.  All other systems reviewed and are negative.    Physical Exam Updated Vital Signs BP 129/73   Pulse 94   Temp 99 F (37.2 C)   Resp 16   LMP 12/11/2014 (Approximate)   SpO2 100%   Physical Exam Vitals signs and nursing note reviewed.  Constitutional:      Appearance: She is well-developed.  HENT:     Head: Normocephalic and atraumatic.  Eyes:     Pupils: Pupils are equal, round, and reactive to light.  Neck:     Musculoskeletal: Neck supple.  Cardiovascular:     Rate and Rhythm: Tachycardia present.     Heart sounds: Normal heart sounds. No murmur.  Pulmonary:     Effort: Pulmonary effort is normal. No respiratory distress.  Abdominal:     Palpations: Abdomen is soft.     Tenderness: There is abdominal tenderness. There is guarding. There is no rebound.  Skin:    General: Skin is warm and dry.  Neurological:     Mental Status: She is alert and oriented to person, place, and time.      ED Treatments / Results  Labs (all labs ordered are listed, but only abnormal results are displayed) Labs Reviewed  COMPREHENSIVE METABOLIC PANEL - Abnormal; Notable for the following components:      Result Value   Potassium 3.3 (*)    Albumin 5.2 (*)    All other components within normal limits  URINALYSIS, ROUTINE W REFLEX MICROSCOPIC - Abnormal;  Notable for the following components:   Ketones, ur 80 (*)    All other components within normal limits  I-STAT BETA HCG BLOOD, ED ([facility], [facility], [facility] ONLY) - Abnormal; Notable for the following components:   I-stat hCG, quantitative 6.4 (*)    All other components within normal limits  SARS CORONAVIRUS 2 (HOSPITAL ORDER, PERFORMED IN [facility])  CBC WITH DIFFERENTIAL/PLATELET  LACTATE DEHYDROGENASE  C-REACTIVE PROTEIN    EKG None  Radiology Ct Abdomen Pelvis W Contrast  Result Date: 01/07/2019 CLINICAL DATA:  Diffuse abdominal pain for 1 week. Nausea, vomiting, and diarrhea. EXAM: CT ABDOMEN AND PELVIS WITH CONTRAST TECHNIQUE: Multidetector CT imaging of the abdomen and pelvis was performed using the standard protocol following bolus administration of intravenous contrast. CONTRAST:  100mL OMNIPAQUE IOHEXOL 300 MG/ML  SOLN COMPARISON:  01/31/2016 from [facility] FINDINGS: Lower Chest: No acute findings. Hepatobiliary: No hepatic masses identified. Gallbladder is unremarkable. Pancreas:  No mass or inflammatory changes. Spleen: Within normal limits in size and appearance. Adrenals/Urinary Tract: No masses identified. No evidence of hydronephrosis. Stomach/Bowel: No evidence of obstruction, inflammatory process or abnormal fluid collections. Vascular/Lymphatic: No pathologically enlarged lymph nodes. No abdominal aortic aneurysm. Reproductive: Prior hysterectomy noted. Adnexal regions are unremarkable in appearance. Other:  None. Musculoskeletal:  No suspicious bone lesions identified. IMPRESSION: Negative. No acute findings or other significant abnormality. Electronically Signed   By: Myles RosenthalJohn  Stahl M.D.   On: 01/07/2019 12:49    Procedures Procedures (including critical care time)  Medications Ordered in ED Medications  morphine 4 MG/ML injection 6 mg (6 mg Intravenous Given 01/08/19 1114)  lactated ringers bolus 1,500 mL (0 mLs Intravenous Stopped 01/08/19 1359)   oxyCODONE-acetaminophen (PERCOCET/ROXICET) 5-325 MG per tablet 1 tablet (1 tablet Oral Given 01/08/19 1419)     Initial Impression / Assessment and Plan / ED Course  I have reviewed the triage vital signs and the nursing notes.  Pertinent labs & imaging results  that were available during my care of the patient were reviewed by me and considered in my medical decision making (see chart for details).  Clinical Course as of Jan 07 1438  Tue Jan 08, 2019  1438 UA has ketonuria.  Patient did not have any episode of vomiting in the ER which is reassuring.  Ketones, ur(!): 80 [AN]  1438 COVID test is negative. Results from the ER workup discussed with the patient face to face and all questions answered to the best of my ability.  Patient has been made aware that the sensitivity of the test is not 100%, and therefore continue with self quarantine and awaiting resolution of symptoms for at least 3 days.  She will be given rectal suppository for Tylenol and promethazine for symptom control.  Patient understands the use of those medications.  SARS Coronavirus 2 [facility] order, Performed in [facility] hospital lab) [AN]    Clinical Course User Index [AN] Derwood Kaplan, MD      28 year old patient comes in with multiple complaints -most notably she is having URI-like symptoms, GI symptoms, fevers, chills.  Her symptoms have been going on for 1 week now.  She has seen her PCP 3 times and also brought to med [employer].  She had a CT scan done yesterday which was negative for acute process.  Patient is noted to be tachycardic.  Her O2 sats 100%.  She has history of endometriosis, PCOS and it seems at some point opioid dependence.  Differential diagnosis for the constellation of symptoms that patient is having does include COVID-19.  Additionally the abdominal pain could be because of endometritis and nausea, vomiting, chills, body aches, sweats -all are noted to be symptoms consistent  with opiate withdrawal.  We will check her for COVID-19. CT scan from yesterday reviewed.  Appropriate labs sent.   Final Clinical Impressions(s) / ED Diagnoses   Final diagnoses:  Viral illness  Non-intractable vomiting with nausea, unspecified vomiting type    ED Discharge Orders         Ordered    promethazine (PHENERGAN) 25 MG suppository  Every 6 hours PRN     01/08/19 1332    acetaminophen (TYLENOL) 325 MG suppository  Every 8 hours PRN     01/08/19 1333           Derwood Kaplan, MD 01/08/19 1133    Derwood Kaplan, MD 01/08/19 1439

## 2019-01-08 NOTE — Progress Notes (Signed)
Amanda Vazquez is a 28 y.o. female who [facility] today for abdominal pain and vomiting.  Amanda Vazquez has been seen several times over the last week with abdominal pain and then vomiting.  She was seen originally on April 7 where x-ray of her chest and abdomen were unremarkable and labs were relatively normal.  She worsened and was seen at [facility] local emergency room where EKG repeat chest x-ray and repeat labs were normal on April 8.  She continued to have symptoms over the weekend and was seen yesterday where she continued to have significant abdominal pain and now developing into difficult to control vomiting.  She was given 1 L IV normal saline, 4 mg of IV Zofran and had a CT scan of her abdomen and pelvis that was fortunately unremarkable.    She notes over the evening she had worsening symptoms.  She is having intractable vomiting cannot keep her fluids or her medication down.  Additionally she is developing fevers chills body aches mild runny nose and a mild cough.  She measure temperature of 101 this morning.  She also notes a faint rash on her legs.    ROS as above:  Exam:  BP 133/75   Pulse (!) 119   Temp 99 F (37.2 C) (Oral)   Wt 115 lb (52.2 kg)   LMP 12/11/2014 (Approximate)   BMI 21.03 kg/m  Wt Readings from Last 5 Encounters:  01/08/19 115 lb (52.2 kg)  01/07/19 116 lb (52.6 kg)  01/01/19 118 lb (53.5 kg)  08/20/18 127 lb (57.6 kg)  07/18/18 132 lb (59.9 kg)    Gen: Ill appearing frequent rigors HEENT: EOMI,  MMM clear nasal discharge Lungs: Normal work of breathing. CTABL Heart: Tachycardia no MRG Abd: hypoactive bowel sounds.  Abdomen rigid with guarding.  Diffusely tender. Exts: Brisk capillary refill, warm and well perfused.  Skin: Faint erythematous macular rash on.  Blanchable.  Lab and Radiology Results Results for orders  placed or performed in visit on 01/07/19 (from the past 72 hour(s))  POCT Urinalysis Dipstick     Status: Abnormal   Collection Time: 01/07/19  9:52 AM  Result Value Ref Range   Color, UA yellow    Clarity, UA clear    Glucose, UA Negative Negative   Bilirubin, UA small    Ketones, UA negative    Spec Grav, UA 1.025 1.010 - 1.025   Blood, UA negative    pH, UA 6.0 5.0 - 8.0   Protein, UA Positive (A) Negative   Urobilinogen, UA 0.2 0.2 or 1.0 E.U./dL   Nitrite, UA positive    Leukocytes, UA Small (1+) (A) Negative   Appearance     Odor     Ct Abdomen Pelvis W Contrast  Result Date: 01/07/2019 CLINICAL DATA:  Diffuse abdominal pain for 1 week. Nausea, vomiting, and diarrhea. EXAM: CT ABDOMEN AND PELVIS WITH CONTRAST TECHNIQUE: Multidetector CT imaging of the abdomen and pelvis was performed using the standard protocol following bolus administration of intravenous contrast. CONTRAST:  100mL OMNIPAQUE IOHEXOL 300 MG/ML  SOLN COMPARISON:  01/31/2016 from [facility] FINDINGS: Lower Chest: No acute findings. Hepatobiliary: No hepatic masses identified. Gallbladder is unremarkable. Pancreas:  No mass or inflammatory changes. Spleen: Within normal limits in size and appearance. Adrenals/Urinary Tract: No masses identified. No evidence of hydronephrosis. Stomach/Bowel: No evidence of obstruction, inflammatory process or abnormal fluid collections. Vascular/Lymphatic: No pathologically enlarged lymph  nodes. No abdominal aortic aneurysm. Reproductive: Prior hysterectomy noted. Adnexal regions are unremarkable in appearance. Other:  None. Musculoskeletal:  No suspicious bone lesions identified. IMPRESSION: Negative. No acute findings or other significant abnormality. Electronically Signed   By: Myles Rosenthal M.D.   On: 01/07/2019 12:49   Dg Abd Acute W/chest  Result Date: 01/01/2019 CLINICAL DATA:  History of IBS with alternating constipation and diarrhea with abdominal pain, initial encounter  EXAM: DG ABDOMEN ACUTE W/ 1V CHEST COMPARISON:  None. FINDINGS: Cardiac shadows within normal limits. The lungs are clear bilaterally. Scattered large and small bowel gas is noted. No obstructive changes are noted. No free air is seen. No acute bony abnormality is noted. IMPRESSION: Negative abdominal radiographs.  No acute cardiopulmonary disease. Electronically Signed   By: Alcide Clever M.D.   On: 01/01/2019 14:32    I personally (independently) visualized and performed the interpretation of the images attached in this note.    Chemistry      Component Value Date/Time   NA 140 01/01/2019 1428   K 4.0 01/01/2019 1428   CL 98 01/01/2019 1428   CO2 27 01/01/2019 1428   BUN 15 01/01/2019 1428   CREATININE 0.72 01/01/2019 1428      Component Value Date/Time   CALCIUM 10.4 (H) 01/01/2019 1428   ALKPHOS 37 (L) 06/25/2014 1604   AST 20 01/01/2019 1428   ALT 15 01/01/2019 1428   BILITOT 0.7 01/01/2019 1428     Lab Results  Component Value Date   WBC 7.6 01/01/2019   HGB 13.4 01/01/2019   HCT 38.1 01/01/2019   MCV 86.4 01/01/2019   PLT 382 01/01/2019      Assessment and Plan: 28 y.o. female with  Abdominal pain intractable vomiting cough congestion.  Symptoms worsening over the last week.  CT scan yesterday was relatively unremarkable and labs last week were largely normal.  Unfortunately now she is having intractable vomiting and worsening pain.  She is not able to keep her antiemetics down and becoming dehydrated.  Additionally she is developed now fevers chills tachycardia body aches and cough.  I think she will require more dedicated care to control her vomiting and also likely more testing to work-up her vomiting abdominal pain and now fever.  Additionally I am concerned that I may be seeing atypical presentation of COVID-19 symptoms and she likely would benefit from further testing especially for COVID-19. After discussing her case with the intake and charge nurse at [facility] and [facility] we determined that [facility] is the best location to send her.  She will be sent via private vehicle.  We will be available for her consultation or check back if needed via epic secure chat feature.  PDMP not reviewed this encounter. No orders of the defined types were placed in this encounter.  No orders of the defined types were placed in this encounter.    Historical information moved to improve visibility of documentation.  Past Medical History:  Diagnosis Date  . Common migraine with intractable migraine 12/16/2016  . Endometriosis   . Frequency of urination   . Migraine   . Pelvic pain in female   . Polycystic ovary disease    Past Surgical History:  Procedure Laterality Date  . ABDOMINAL HYSTERECTOMY    . DX LAPAROSCOPY W/ LASER ABLATION OF ENDOMETRIOSIS  06-12-2014   [facility]  . LAPAROSCOPIC OVARIAN CYSTECTOMY Left 01/08/2015   Procedure: LAPAROSCOPIC OVARIAN CYSTECTOMY;  Surgeon:  Richardean Chimera, MD;  Location: [facility];  Service: Gynecology;  Laterality: Left;  . LAPAROSCOPY N/A 01/08/2015   Procedure: LAPAROSCOPY DIAGNOSTIC;  Surgeon: Richardean Chimera, MD;  Location: [facility];  Service: Gynecology;  Laterality: N/A;  . TONSILLECTOMY  age 59   Social History   Tobacco Use  . Smoking status: Never Smoker  . Smokeless tobacco: Never Used  Substance Use Topics  . Alcohol use: Yes    Comment: occasional   family history includes Alcohol abuse in her mother; Cancer in her maternal grandfather, maternal grandmother, and paternal grandfather; Diabetes in her maternal grandfather and sister; Hypertension in her maternal grandfather and paternal grandmother.  Medications: Current Outpatient Medications  Medication Sig Dispense Refill  . ALPRAZolam (XANAX) 0.25 MG tablet TAKE 1 TABLET BY MOUTH TWICE DAILY AS NEEDED FOR ANXIETY 60 tablet 1  . baclofen (LIORESAL) 10 MG tablet Take 1 tablet  (10 mg total) by mouth 3 (three) times daily as needed (abd spasm). 30 each 1  . bisacodyl (DULCOLAX) 10 MG suppository Place rectally.    Marland Kitchen buPROPion (WELLBUTRIN XL) 300 MG 24 hr tablet Take 1 tablet (300 mg total) by mouth every morning. 90 tablet 1  . cloNIDine (CATAPRES) 0.2 MG tablet TAKE 1 TABLET(0.2 MG) BY MOUTH AT BEDTIME 90 tablet 5  . cyclobenzaprine (FLEXERIL) 5 MG tablet   0  . diazepam (VALIUM) 5 MG tablet PLACE 1 TABLET VAGINALLY THE NIGHT BEFORE PROCEDURE 5 tablet 0  . HYDROcodone-acetaminophen (NORCO/VICODIN) 5-325 MG tablet Take 1 tablet by mouth every 6 (six) hours as needed. 10 tablet 0  . ibuprofen (ADVIL,MOTRIN) 600 MG tablet Take by mouth.    . norethindrone (AYGESTIN) 5 MG tablet Take 1 tablet (5 mg total) by mouth daily for 30 days. 30 tablet 0  . ondansetron (ZOFRAN) 4 MG tablet Take 1 tablet (4 mg total) by mouth every 8 (eight) hours as needed for nausea or vomiting. 20 tablet 12  . PARoxetine (PAXIL) 30 MG tablet Take 1 tablet (30 mg total) by mouth daily. 90 tablet 1  . promethazine (PHENERGAN) 25 MG tablet TAKE 1 TABLET(25 MG) BY MOUTH EVERY 8 HOURS AS NEEDED FOR NAUSEA OR VOMITING 30 tablet 1   Current Facility-Administered Medications  Medication Dose Route Frequency Provider Last Rate Last Dose  . ondansetron (ZOFRAN) 4 mg in sodium chloride 0.9 % 50 mL IVPB  4 mg Intravenous Once Rodolph Bong, MD       Allergies  Allergen Reactions  . Buprenorphine Nausea Only     Discussed warning signs or symptoms. Please see discharge instructions. Patient expresses understanding.

## 2019-01-09 DIAGNOSIS — R112 Nausea with vomiting, unspecified: Secondary | ICD-10-CM | POA: Insufficient documentation

## 2019-01-12 MED ORDER — GENERIC EXTERNAL MEDICATION
Status: DC
Start: ? — End: 2019-01-12

## 2019-01-12 MED ORDER — BARO-CAT PO
10.00 | ORAL | Status: DC
Start: ? — End: 2019-01-12

## 2019-01-12 MED ORDER — HYDROCODONE-ACETAMINOPHEN 5-325 MG PO TABS
1.00 | ORAL_TABLET | ORAL | Status: DC
Start: ? — End: 2019-01-12

## 2019-01-12 MED ORDER — DICYCLOMINE HCL 10 MG PO CAPS
10.00 | ORAL_CAPSULE | ORAL | Status: DC
Start: 2019-01-11 — End: 2019-01-12

## 2019-01-12 MED ORDER — ALPRAZOLAM 0.25 MG PO TABS
.25 | ORAL_TABLET | ORAL | Status: DC
Start: ? — End: 2019-01-12

## 2019-01-12 MED ORDER — SODIUM CHLORIDE 0.9 % IV SOLN
100.00 | INTRAVENOUS | Status: DC
Start: ? — End: 2019-01-12

## 2019-01-12 MED ORDER — LORAZEPAM 2 MG/ML IJ SOLN
1.00 | INTRAMUSCULAR | Status: DC
Start: ? — End: 2019-01-12

## 2019-01-12 MED ORDER — CVS TUSSIN DM CLEAR PO
2.00 | ORAL | Status: DC
Start: ? — End: 2019-01-12

## 2019-01-15 ENCOUNTER — Encounter: Payer: Self-pay | Admitting: Family Medicine

## 2019-01-15 ENCOUNTER — Ambulatory Visit (INDEPENDENT_AMBULATORY_CARE_PROVIDER_SITE_OTHER): Payer: Self-pay | Admitting: Family Medicine

## 2019-01-15 ENCOUNTER — Telehealth: Payer: Self-pay | Admitting: Physician Assistant

## 2019-01-15 VITALS — BP 127/83 | HR 156 | Temp 98.1°F | Wt 110.0 lb

## 2019-01-15 DIAGNOSIS — F1123 Opioid dependence with withdrawal: Secondary | ICD-10-CM

## 2019-01-15 DIAGNOSIS — R109 Unspecified abdominal pain: Secondary | ICD-10-CM

## 2019-01-15 DIAGNOSIS — R000 Tachycardia, unspecified: Secondary | ICD-10-CM

## 2019-01-15 DIAGNOSIS — M549 Dorsalgia, unspecified: Secondary | ICD-10-CM

## 2019-01-15 DIAGNOSIS — R6889 Other general symptoms and signs: Secondary | ICD-10-CM

## 2019-01-15 DIAGNOSIS — E876 Hypokalemia: Secondary | ICD-10-CM

## 2019-01-15 DIAGNOSIS — F1193 Opioid use, unspecified with withdrawal: Secondary | ICD-10-CM

## 2019-01-15 MED ORDER — CLONAZEPAM 0.5 MG PO TABS: 0.5000 mg | ORAL_TABLET | Freq: Two times a day (BID) | ORAL | 0 refills | Status: DC | PRN

## 2019-01-15 MED ORDER — CLONIDINE HCL 0.2 MG PO TABS: 0.2000 mg | ORAL_TABLET | Freq: Every day | ORAL | 1 refills | Status: DC

## 2019-01-15 NOTE — Patient Instructions (Addendum)
Thank you for coming in today.  I do recommend getting you to the ER but if you do not want to go.   We will get labs.   Send me an update tomorrow morning.   We will restart clonidine.    Sinus Tachycardia  Sinus tachycardia is a kind of fast heartbeat. In sinus tachycardia, the heart beats more than 100 times a minute. Sinus tachycardia starts in a part of the heart called the sinus node. Sinus tachycardia may be harmless, or it may be a sign of a serious condition. What are the causes? This condition may be caused by:  Exercise or exertion.  A fever.  Pain.  Loss of body fluids (dehydration).  Severe bleeding (hemorrhage).  Anxiety and stress.  Certain substances, including: ? Alcohol. ? Caffeine. ? Tobacco and nicotine products. ? Cold medicines. ? Illegal drugs.  Medical conditions including: ? Heart disease. ? An infection. ? An overactive thyroid (hyperthyroidism). ? A lack of red blood cells (anemia). What are the signs or symptoms? Symptoms of this condition include:  A feeling that the heart is beating quickly (palpitations).  Suddenly noticing your heartbeat (cardiac awareness).  Dizziness.  Tiredness (fatigue).  Shortness of breath.  Chest pain.  Nausea.  Fainting. How is this diagnosed? This condition is diagnosed with:  A physical exam.  Other tests, such as: ? Blood tests. ? An electrocardiogram (ECG). This test measures the electrical activity of the heart. ? Ambulatory cardiac monitor. This records your heartbeats for 24 hours or more. You may be referred to a heart specialist (cardiologist). How is this treated? Treatment for this condition depends on the cause or the underlying condition. Treatment may involve:  Treating the underlying condition.  Taking new medicines or changing your current medicines as told by your health care provider.  Making changes to your diet or lifestyle. Follow these instructions at  home: Lifestyle   Do not use any products that contain nicotine or tobacco, such as cigarettes and e-cigarettes. If you need help quitting, ask your health care provider.  Do not use illegal drugs, such as cocaine.  Learn relaxation methods to help you when you get stressed or anxious. These include deep breathing.  Avoid caffeine or other stimulants. Alcohol use   Do not drink alcohol if: ? Your health care provider tells you not to drink. ? You are pregnant, may be pregnant, or are planning to become pregnant.  If you drink alcohol, limit how much you have: ? 0-1 drink a day for women. ? 0-2 drinks a day for men.  Be aware of how much alcohol is in your drink. In the [place] one drink equals one typical bottle of beer (12 oz), one-half glass of wine (5 oz), or one shot of hard liquor (1 oz). General instructions  Drink enough fluids to keep your urine pale yellow.  Take over-the-counter and prescription medicines only as told by your health care provider.  Keep all follow-up visits as told by your health care provider. This is important. Contact a health care provider if you have:  A fever.  Vomiting or diarrhea that does not go away. Get help right away if you:  Have pain in your chest, upper arms, jaw, or neck.  Become weak or dizzy.  Feel faint.  Have palpitations that do not go away. Summary  In sinus tachycardia, the heart beats more than 100 times a minute.  Sinus tachycardia may be harmless, or it may be a sign  of a serious condition.  Treatment for this condition depends on the cause or the underlying condition.  Get help right away if you have pain in your chest, upper arms, jaw, or neck. This information is not intended to replace advice given to you by your health care provider. Make sure you discuss any questions you have with your health care provider. Document Released: 10/20/2004 Document Revised: 11/01/2017 Document Reviewed: 11/01/2017 Elsevier  Interactive Patient Education  2019 [employer] Inc[employer]    Opioid Withdrawal Treatment Opioid withdrawal is a group of symptoms that can happen if you have been taking opioids and then stop taking them. Opioid withdrawal can also happen:  When the dosage is decreased.  After stopping a prescription opioid as directed. Physical dependence can develop after taking opioids regularly for just a few days.  After taking a prescription opioid incorrectly, such as taking more than recommended or taking it for a different purpose (opioid misuse).  Taking the opioid illegally and then stopping. Opioid withdrawal is not usually life-threatening, but it can be very uncomfortable and severe. Opioid withdrawal treatment makes managing withdrawal easier. What are the signs and symptoms of opioid withdrawal? Symptoms of this condition can be both physical and emotional. Physical symptoms include:  Nausea and vomiting.  Muscle aches or spasms.  Watery eyes and runny nose.  Widening of the dark centers of the eyes (dilated pupils).  Hair standing on end.  Fever and sweating.  Flushing and itching of the skin.  Stomach cramping and diarrhea.  Trouble sleeping. Emotional symptoms include:  Depression.  Anxiety and nervousness.  Restlessness, irritability, and severe mood swings. When symptoms start and how long they last depend on the kind of opioid you have been taking.  Short-acting opioids, such as heroin or oxycodone, work fast and then lose their effect quickly. Symptoms occur within hours of stopping or reducing the amount you take. The worst symptoms (peak withdrawal) occur in 2-3 days. Overall, symptoms should lessen in 7-10 days.  Long-acting opioids, such as methadone, work for a longer period of time. Symptoms can occur within 1-3 days of stopping or reducing the amount you take. The worst symptoms occur in 3-8 days. Symptoms may continue for several weeks but will be milder. There  are also medicines that block the effects of opioids (opioid antagonists), such as naloxone. Naloxone is given to restore a person's breathing if it has slowed down or stopped due to an opioid overdose. The effect includes withdrawal symptoms that begin within minutes and last for about an hour. It is important to get medical help in this situation. How is this treated? Treatment for opioid withdrawal usually happens:  In an emergency department.  In a clinic or drug treatment facility.  At home.  In a combination of different settings. Treatment for this condition depends on the type of opioids that are causing your symptoms. Medicines  Opioid or opioid-like medicine. This is the most effective treatment. It is used to block withdrawal symptoms, and then the dose is lowered over time.  If you have been taking prescription opioid medicine, your health care provider may lower the dose over several days.  If you have opioid use disorder, your health care provider may give you the following medicines to block withdrawals: ? Methadone. It is given at a certain dose. The dose is then slowly lowered over 6-10 days. ? Buprenorphine. It is given at a certain dose. The dose is then lowered over 3-5 days. In some cases, it  may be lowered very slowly over 30 days.  Other medicines may also be used as a stand-alone treatment or in combination with other treatments. These include: ? Alpha-2 adrenergic agonists. These medicines block a brain chemical (norepinephrine) that causes some withdrawal symptoms. They can reduce and lessen the severity of symptoms but do not stop them. The medicines can cause side effects such as drowsiness, dizziness, and low blood pressure. ? Other medicines to reduce anxiety, relieve muscle aches, promote sleep, and decrease digestive symptoms like nausea and diarrhea. Other treatments   Counseling. This treatment is also called talk therapy. It is provided by treatment  counselors and is used in addition to medicines.  Support groups. These groups provide emotional support, advice, and guidance during recovery. They offer a safe environment in which you can talk to present and past opioid users and those who have gone through treatment. Follow these instructions at home:  Always check with your health care provider before starting any new medicines. Many of the medicines used to treat opioid withdrawal can have dangerous side effects and should only be taken as prescribed by your health care provider.  Do not start using an opioid medicine again without talking to a health care provider. You may be at an increased risk for problems, including opioid overdose.  If you have chronic pain, ask your health care provider about an intensive pain rehabilitation program or a referral to a chronic pain specialist. You may need to work with a pain specialist to come up with a treatment plan to help manage pain.  Keep all follow-up visits as told by your health care provider. This is important. Contact a health care provider if:  You need help stopping use of an opioid medicine.  You have been on long-term opioids and are planning to stop, and you would like to prepare for any withdrawal symptoms.  You have been treated at home, but you are still having symptoms of withdrawal.  You have been treated for withdrawal, but you have started using opioids again (relapsed). Get help right away if you:  Have chest pain and shortness of breath.  Are drowsy and have difficulty staying awake.  Feel weak or dizzy, or feel like you are going to pass out.  Have nausea or vomiting that does not go away, or you begin to vomit blood.  Have diarrhea that does not stop, or you begin to feel weak and light-headed.  Feel severely depressed and anxious.  Feel you may be a harm to yourself or others. These symptoms may represent a serious problem that is an emergency. Do not wait  to see if the symptoms will go away. Get medical help right away. Call your local emergency services (911 in the [place] Do not drive yourself to the hospital. Summary  Opioid withdrawal is a group of symptoms that can happen if you have been taking opioids and then stop taking them.  Opioid withdrawal symptoms are treated using medicines, counseling, and support groups.  The most effective treatment is to use an opioid or opioid-like drug to block withdrawal symptoms and gradually lower the dose over time. This information is not intended to replace advice given to you by your health care provider. Make sure you discuss any questions you have with your health care provider. Document Released: 12/28/2017 Document Revised: 12/28/2017 Document Reviewed: 12/28/2017 Elsevier Interactive Patient Education  2019 [employer].

## 2019-01-15 NOTE — Progress Notes (Signed)
Amanda Vazquez is a 28 y.o. female who [facility] today for abdominal and back pain.  Patient was admitted to the hospital and discharged on April 14 to April 17.  She was admitted for intractable nausea and vomiting as well as chronic abdominal pain.  During her hospitalization clonidine was discontinued she was treated with IV fluids bowel rest and and antinausea medications.  She had improvement and it was advanced to full diet.  Additionally she had hypokalemia which was repleted.   Since she was discharged from the hospital she is able to keep some fluids down by drinking popsicles and Pedialyte.  She notes that she has diffuse body aches and pain and nausea.  She no longer is vomiting.  She notes that she feels poorly overall with fatigue nausea and diffuse body aches and pain as well as abdominal pain.  After hospital discharge she was prescribed a limited amount of hydrocodone and ran out of them yesterday.  As during her hospitalization she did have tachycardia and had EKGs that showed some mild changes.  She denies chest pain or palpitations currently.  Of note while in the emergency department on April 14 she had COVID-19 test which was negative.  Lastly she notes that she would like to switch from Xanax to Klonopin for anxiety.  She notes her anxiety has been worsening recently with her pain complaints.  She has used Klonopin in the past successfully.  She notes it did cause fatigue but because she is feeling badly now she thinks that she would be willing to use a longer acting medication that might make her feel tired if it was result in less anxiety.  ROS as above:  Exam:  BP 127/83   Pulse (!) 156   Temp 98.1 F (36.7 C) (Oral)   Wt 110 lb (49.9 kg)   LMP 12/11/2014 (Approximate)   BMI 20.12 kg/m  Wt Readings from Last 5 Encounters:   01/15/19 110 lb (49.9 kg)  01/08/19 115 lb (52.2 kg)  01/07/19 116 lb (52.6 kg)  01/01/19 118 lb (53.5 kg)  08/20/18 127 lb (57.6 kg)    Gen: Well nontoxic HEENT: EOMI,  MMM Lungs: Normal work of breathing. CTABL Heart: Tachycardia regular rhythm no MRG heart rate 130 bpm per my check. Abd: NABS, Soft. Nondistended, palpation. Exts: Brisk capillary refill, warm and well perfused.  Normal skin turgor  Lab and Radiology Results No results found for this or any previous visit (from the past 72 hour(s)). No results found. 12 lead EKG:  Sinus tachycardia at 142 bpm.  ST segment without significant depression.  T wave flattened. QT 348.  QTc 535       Assessment and Plan: 28 y.o. female with  Tachycardia: Patient has not clearly understood sinus tachycardia with some ST segment changes.  Based on her hospitalization she does have descriptions of ST changes that are similar to the one I see in the EKG today however I do not have the EKGs from [facility] to compare.  I think her current tachycardia is likely multifactorial.  I think opiate withdrawal is certainly pain a factor here.  Additionally clonidine was abruptly discontinued which may be contributing to tachycardia.  Additionally I think dehydration is somewhat likely and I am worried about electrolyte abnormality as she did have hypokalemia during her hospitalization.  Unfortunately I do not have the ability to work this up safely in  the outpatient setting and recommend going to the emergency room.  She does not have health insurance and would like to avoid the emergency room if possible.  We discussed that this is not a safe option but will check metabolic panel and reassess in the morning if she decides not to go to the emergency room.  As for her pain she has a history of chronic abdominal pain and has worsening whole body pain recently.  She could have some viral etiology or some underlying etiology however think some of her pain  is probably related to opiate withdrawal.  We will continue to watch and avoid opiates if possible.  Restart clonidine.  Anxiety: As noted previously she like to switch from Xanax to Klonopin.  I think Klonopin in general is going to be a safer alternative than Xanax and will switch to limited Klonopin.   Reassess tomorrow morning. Precautions reviewed safety reviewed.  PDMP reviewed during this encounter. Orders Placed This Encounter  Procedures  . BASIC METABOLIC PANEL WITH GFR   Meds ordered this encounter  Medications  . cloNIDine (CATAPRES) 0.2 MG tablet    Sig: Take 1 tablet (0.2 mg total) by mouth at bedtime.    Dispense:  30 tablet    Refill:  1  . clonazePAM (KLONOPIN) 0.5 MG tablet    Sig: Take 1 tablet (0.5 mg total) by mouth 2 (two) times daily as needed for anxiety.    Dispense:  30 tablet    Refill:  0     Historical information moved to improve visibility of documentation.  Past Medical History:  Diagnosis Date  . Common migraine with intractable migraine 12/16/2016  . Endometriosis   . Frequency of urination   . Migraine   . Pelvic pain in female   . Polycystic ovary disease    Past Surgical History:  Procedure Laterality Date  . ABDOMINAL HYSTERECTOMY    . DX LAPAROSCOPY W/ LASER ABLATION OF ENDOMETRIOSIS  06-12-2014   [facility]  . LAPAROSCOPIC OVARIAN CYSTECTOMY Left 01/08/2015   Procedure: LAPAROSCOPIC OVARIAN CYSTECTOMY;  Surgeon: Richardean Chimera, MD;  Location[facility];  Service: Gynecology;  Laterality: Left;  . LAPAROSCOPY N/A 01/08/2015   Procedure: LAPAROSCOPY DIAGNOSTIC;  Surgeon: Richardean Chimera, MD;  Location: [facility];  Service: Gynecology;  Laterality: N/A;  . TONSILLECTOMY  age 26   Social History   Tobacco Use  . Smoking status: Never Smoker  . Smokeless tobacco: Never Used  Substance Use Topics  . Alcohol use: Yes    Comment: occasional   family history includes Alcohol abuse in her  mother; Cancer in her maternal grandfather, maternal grandmother, and paternal grandfather; Diabetes in her maternal grandfather and sister; Hypertension in her maternal grandfather and paternal grandmother.  Medications: Current Outpatient Medications  Medication Sig Dispense Refill  . acetaminophen (TYLENOL) 325 MG suppository Place 2 suppositories (650 mg total) rectally every 8 (eight) hours as needed for moderate pain. 30 suppository 0  . baclofen (LIORESAL) 10 MG tablet Take 1 tablet (10 mg total) by mouth 3 (three) times daily as needed (abd spasm). 30 each 1  . buPROPion (WELLBUTRIN XL) 300 MG 24 hr tablet Take 1 tablet (300 mg total) by mouth every morning. (Patient not taking: Reported on 01/08/2019) 90 tablet 1  . clonazePAM (KLONOPIN) 0.5 MG tablet Take 1 tablet (0.5 mg total) by mouth 2 (two) times daily as needed for anxiety. 30 tablet 0  . cloNIDine (CATAPRES) 0.2  MG tablet Take 1 tablet (0.2 mg total) by mouth at bedtime. 30 tablet 1  . diazepam (VALIUM) 5 MG tablet PLACE 1 TABLET VAGINALLY THE NIGHT BEFORE PROCEDURE (Patient not taking: Reported on 01/08/2019) 5 tablet 0  . dicyclomine (BENTYL) 10 MG capsule TK 1 C PO 30 MIN B MEALS AND HS FOR 5 DAYS    . diphenoxylate-atropine (LOMOTIL) 2.5-0.025 MG tablet Take by mouth.    Marland Kitchen ibuprofen (ADVIL,MOTRIN) 800 MG tablet Take 800 mg by mouth every 8 (eight) hours as needed for headache or moderate pain.    Marland Kitchen loperamide (IMODIUM) 2 MG capsule Take 2 mg by mouth as needed for diarrhea or loose stools.    . norethindrone (AYGESTIN) 5 MG tablet Take 1 tablet (5 mg total) by mouth daily for 30 days. (Patient not taking: Reported on 01/08/2019) 30 tablet 0  . ondansetron (ZOFRAN) 4 MG tablet Take 1 tablet (4 mg total) by mouth every 8 (eight) hours as needed for nausea or vomiting. (Patient not taking: Reported on 01/08/2019) 20 tablet 12  . PARoxetine (PAXIL) 30 MG tablet Take 1 tablet (30 mg total) by mouth daily. (Patient not taking: Reported on  01/08/2019) 90 tablet 1  . promethazine (PHENERGAN) 25 MG suppository Place 1 suppository (25 mg total) rectally every 6 (six) hours as needed for nausea. 12 each 0  . promethazine (PHENERGAN) 25 MG tablet TAKE 1 TABLET(25 MG) BY MOUTH EVERY 8 HOURS AS NEEDED FOR NAUSEA OR VOMITING (Patient taking differently: Take 25 mg by mouth every 8 (eight) hours as needed for nausea or vomiting. TAKE 1 TABLET(25 MG) BY MOUTH EVERY 8 HOURS AS NEEDED FOR NAUSEA OR VOMITING) 30 tablet 1   Current Facility-Administered Medications  Medication Dose Route Frequency Provider Last Rate Last Dose  . ondansetron (ZOFRAN) 4 mg in sodium chloride 0.9 % 50 mL IVPB  4 mg Intravenous Once Rodolph Bong, MD       Allergies  Allergen Reactions  . Buprenorphine Nausea Only     Discussed warning signs or symptoms. Please see discharge instructions. Patient expresses understanding.

## 2019-01-15 NOTE — Progress Notes (Signed)
Based on what you shared with me, I feel your condition warrants further evaluation and I recommend that you be seen for a face to face office visit.     NOTE: If you entered your credit card information for this eVisit, you will not be charged. You may see a "hold" on your card for the $35 but that hold will drop off and you will not have a charge processed.  If you are having a true medical emergency please call 911.  If you need an urgent face to face visit, [facility] has four urgent care centers for your convenience.    PLEASE NOTE: THE INSTACARE LOCATIONS AND URGENT CARE CLINICS DO NOT HAVE THE TESTING FOR CORONAVIRUS COVID19 AVAILABLE.  IF YOU FEEL YOU NEED THIS TEST YOU MUST GO TO A TRIAGE LOCATION AT ONE OF THE HOSPITAL EMERGENCY DEPARTMENTS ?  [email or web address] to reserve your spot online an avoid wait times  [facility] [address], [address] [place], [place] [address] Modified hours of operation: Monday-Friday, 10 AM to 6 PM  Saturday & Sunday 10 AM to 4 PM *Across the street from [employer]  [facility] (New Address!) [address], [address] [place], [place] [address] *Just off [address], across the road from [employer]* Modified hours of operation: Monday-Friday, 10 AM to 5 PM  Closed Saturday & Sunday   The following sites will take your insurance:  [facility]  [phone] Get Driving Directions Find a Provider at this Location  [address] [place], [place] [address] 10 am to 8 pm Monday-Friday 12 pm to 8 pm [facility]    [facility]gent Care at [facility]  [phone] Get Driving Directions Find a Provider at this Location  [address], [address] [place], [place] [address] 8 am to 8 pm Monday-Friday 9 am to 6 pm Saturday 11 am to 6 pm Sunday   [facility]  [phone] Get Driving Directions  [id number] [place], [place] [address] 8 am to 8 pm Monday-Friday 8 am to 4 pm Saturday-Sunday   Your e-visit answers were reviewed by a board certified advanced clinical practitioner to complete your personal care plan.  Thank you for using e-Visits.  ===View-only below this line===   ----- Message -----    From: Berenice PrimasAmber Kuehnel    Sent: 01/15/2019  2:46 PM EDT      To: E-Visit Mailing List Subject: E-Visit Submission: Back Pain  E-Visit Submission: Back Pain --------------------------------  Question: Where are you having pain Answer:   Left lower back  Question: Does the pain extend into your legs? Answer:   No  Question: Are you having any numbness or weakness of the legs? Answer:   No  Question: Does this pain radiate to the abdomen or groin? Answer:   Yes  Question: How bad is the pain? Answer:   The pain is as bad as I have ever had  Question: Did you have an injury that caused the pain? Answer:   No, I cannot remember an injury  Question: How long has the pain been present? Answer:   More than 1 week but less then 4 weeks  Question: Have you had back pain in the past? Answer:   I have had back pain before, but this is markedly different  Question: Do you have a history of fever associated with your back pain? Answer:   No  Question: Please list any  medications you have previously taken for back pain. Answer:   Back within on pain medicine  Question: Do you have a fever? Answer:   Yes, I have a low fever (less than 101 degrees)  Question: Do you have any of the following? Answer:   Areas that are numb or have a strange sensation            Fatigue  Question: What makes the pain worse? Answer:   Any movement  Question: What makes the pain better Answer:   Pain medicine  Question: Do you get relief with certain positions (even if only partial relief)? Answer:   Yes  Question: Have you ever been diagnosed with cancer? Answer:   No  Question: Have  you ever been diagnosed with arthritis? Answer:   No  Question: Have you ever been diagnosed with osteoporosis or any other bone weakness? Answer:   No  Question: Have you ever had surgery on your back or spine? Answer:   No  Question: Do you have a history of Intravenous Drug Use? Answer:   No  Question: What is your usual health status? Answer:   My activity is physically restricted  Question: Are you pregnant? Answer:   I am confident that I am not pregnant  Question: Are you breastfeeding? Answer:   No  Question: Please list your medication allergies that you may have ? (If 'none' , please list as 'none') Answer:   none  Question: Please list any additional comments  Answer:   It's playing has been going on for about 2 1/2 week. He sent me home from the hospital on Friday said if it got worse to contact my primary care doctor he would be able to write me some pain medication to get me through the nextvAnd then I should really start feeling better.

## 2019-01-18 ENCOUNTER — Encounter: Payer: Self-pay | Admitting: Family Medicine

## 2019-01-18 ENCOUNTER — Ambulatory Visit (INDEPENDENT_AMBULATORY_CARE_PROVIDER_SITE_OTHER): Payer: Self-pay | Admitting: Family Medicine

## 2019-01-18 ENCOUNTER — Other Ambulatory Visit: Payer: Self-pay

## 2019-01-18 VITALS — Temp 99.1°F | Ht 62.0 in | Wt 115.0 lb

## 2019-01-18 DIAGNOSIS — F1123 Opioid dependence with withdrawal: Secondary | ICD-10-CM

## 2019-01-18 DIAGNOSIS — F1193 Opioid use, unspecified with withdrawal: Secondary | ICD-10-CM

## 2019-01-18 MED ORDER — CLONIDINE HCL 0.1 MG PO TABS: 0.1000 mg | ORAL_TABLET | Freq: Every day | ORAL | 1 refills | Status: DC

## 2019-01-18 NOTE — Progress Notes (Signed)
Virtual Visit  via Video Note  I connected with      [employer] by a video enabled telemedicine application and verified that I am speaking with the correct person using two identifiers.   I discussed the limitations of evaluation and management by telemedicine and the availability of in person appointments. The patient expressed understanding and agreed to proceed.  History of Present Illness: Amanda Vazquez is a 28 y.o. female who would like to discuss pain and lightheadedness and jitteriness.  Patient was seen in clinic on April 21 for a variety of complaints including tachycardia.  She had been dealing with abdominal pain and discomfort since around April 7.  She at that time was thought to be suffering from opiate withdrawal with possible other complicating factor.  She had been given limited amount of opiates for her abdominal pain but had run out and was then having worsening symptoms including tachycardia nausea and skin crawling sensation.  She visited the emergency room on the 22nd due to sinus tachycardia and had limited work-up.  She returns today noting she is feeling a little bit better but still having tachycardia diffuse pain and skin crawling sensation and nausea.  She is willing to consider addiction medicine clinic for possible Suboxone or methadone.     Observations/Objective: Temp 99.1 F (37.3 C) (Oral)   Ht  (1.575 m)   Wt 115 lb (52.2 kg)   LMP 12/11/2014 (Approximate)   BMI 21.03 kg/m    Wt Readings from Last 5 Encounters:  01/18/19 115 lb (52.2 kg)  01/15/19 110 lb (49.9 kg)  01/08/19 115 lb (52.2 kg)  01/07/19 116 lb (52.6 kg)  01/01/19 118 lb (53.5 kg)   Exam: Appearance nontoxic no acute distress Normal Speech.    Lab and Radiology Results No results found for this or any previous visit (from the past 72 hour(s)). No results found.   Assessment and Plan: 28 y.o. female with opiate withdrawal.  I believe at this point the majority  of her symptoms are due to opiate withdrawal.  She likely did have some viral illness causing her original abdominal complaints which is now resolved.  Her symptoms seem to be worsened when she stops opiates which has been a problem in the past.  We discussed options and will proceed with referral for addiction medicine clinic.  Additionally will use a limited clonidine for symptom management if possible.  Caution for hypotension.  Plan for watchful waiting and recheck in the near future if needed.  PDMP not reviewed this encounter. Orders Placed This Encounter  Procedures  . Ambulatory referral to Chemical Dependency    Referral Priority:   Routine    Referral Type:   Psychiatric    Referral Reason:   Specialty Services Required    Requested Specialty:   Addiction Medicine    Number of Visits Requested:   1   Meds ordered this encounter  Medications  . cloNIDine (CATAPRES) 0.1 MG tablet    Sig: Take 1 tablet (0.1 mg total) by mouth at bedtime.    Dispense:  30 tablet    Refill:  1    Follow Up Instructions:    I discussed the assessment and treatment plan with the patient. The patient was provided an opportunity to ask questions and all were answered. The patient agreed with the plan and demonstrated an understanding of the instructions.   The patient was advised to call back or seek an in-person evaluation if  the symptoms worsen or if the condition fails to improve as anticipated.  Time: 15 minutes of intraservice time, with >22 minutes of total time during today's visit.      Historical information moved to improve visibility of documentation.  Past Medical History:  Diagnosis Date  . Common migraine with intractable migraine 12/16/2016  . Endometriosis   . Frequency of urination   . Migraine   . Pelvic pain in female   . Polycystic ovary disease    Past Surgical History:  Procedure Laterality Date  . ABDOMINAL HYSTERECTOMY    . DX LAPAROSCOPY W/ LASER ABLATION OF  ENDOMETRIOSIS  06-12-2014   [facility]  . LAPAROSCOPIC OVARIAN CYSTECTOMY Left 01/08/2015   Procedure: LAPAROSCOPIC OVARIAN CYSTECTOMY;  Surgeon: Richardean Chimera, MD;  [facility];  Service: Gynecology;  Laterality: Left;  . LAPAROSCOPY N/A 01/08/2015   Procedure: LAPAROSCOPY DIAGNOSTIC;  Surgeon: Richardean Chimera, MD;  Location: [facility];  Service: Gynecology;  Laterality: N/A;  . TONSILLECTOMY  age 41   Social History   Tobacco Use  . Smoking status: Never Smoker  . Smokeless tobacco: Never Used  Substance Use Topics  . Alcohol use: Yes    Comment: occasional   family history includes Alcohol abuse in her mother; Cancer in her maternal grandfather, maternal grandmother, and paternal grandfather; Diabetes in her maternal grandfather and sister; Hypertension in her maternal grandfather and paternal grandmother.  Medications: Current Outpatient Medications  Medication Sig Dispense Refill  . acetaminophen (TYLENOL) 325 MG suppository Place 2 suppositories (650 mg total) rectally every 8 (eight) hours as needed for moderate pain. 30 suppository 0  . baclofen (LIORESAL) 10 MG tablet Take 1 tablet (10 mg total) by mouth 3 (three) times daily as needed (abd spasm). 30 each 1  . buPROPion (WELLBUTRIN XL) 300 MG 24 hr tablet Take 1 tablet (300 mg total) by mouth every morning. 90 tablet 1  . clonazePAM (KLONOPIN) 0.5 MG tablet Take 1 tablet (0.5 mg total) by mouth 2 (two) times daily as needed for anxiety. 30 tablet 0  . diazepam (VALIUM) 5 MG tablet PLACE 1 TABLET VAGINALLY THE NIGHT BEFORE PROCEDURE 5 tablet 0  . dicyclomine (BENTYL) 10 MG capsule TK 1 C PO 30 MIN B MEALS AND HS FOR 5 DAYS    . diphenoxylate-atropine (LOMOTIL) 2.5-0.025 MG tablet Take by mouth.    . hydrOXYzine (ATARAX/VISTARIL) 25 MG tablet Take by mouth.    Marland Kitchen ibuprofen (ADVIL,MOTRIN) 800 MG tablet Take 800 mg by mouth every 8 (eight) hours as needed for headache or moderate  pain.    Marland Kitchen loperamide (IMODIUM) 2 MG capsule Take 2 mg by mouth as needed for diarrhea or loose stools.    . norethindrone (AYGESTIN) 5 MG tablet Take 1 tablet (5 mg total) by mouth daily for 30 days. 30 tablet 0  . ondansetron (ZOFRAN) 4 MG tablet Take 1 tablet (4 mg total) by mouth every 8 (eight) hours as needed for nausea or vomiting. 20 tablet 12  . PARoxetine (PAXIL) 30 MG tablet Take 1 tablet (30 mg total) by mouth daily. 90 tablet 1  . promethazine (PHENERGAN) 25 MG suppository Place 1 suppository (25 mg total) rectally every 6 (six) hours as needed for nausea. 12 each 0  . promethazine (PHENERGAN) 25 MG tablet TAKE 1 TABLET(25 MG) BY MOUTH EVERY 8 HOURS AS NEEDED FOR NAUSEA OR VOMITING (Patient taking differently: Take 25 mg by mouth every 8 (eight) hours as needed  for nausea or vomiting. TAKE 1 TABLET(25 MG) BY MOUTH EVERY 8 HOURS AS NEEDED FOR NAUSEA OR VOMITING) 30 tablet 1  . cloNIDine (CATAPRES) 0.1 MG tablet Take 1 tablet (0.1 mg total) by mouth at bedtime. 30 tablet 1   No current facility-administered medications for this visit.    Allergies  Allergen Reactions  . Buprenorphine Nausea Only

## 2019-01-18 NOTE — Patient Instructions (Signed)
Thank you for coming in today. Ok to use clonidine 0.1 mg at night for withdraw symptoms.  They will slowly improve as the time since last opiate dose goes longer.  You should hear from the [facility] soon.  Let me know if you do not hear anything.  I am here for you and will do my best to help safely.    Opioid Withdrawal Treatment Opioid withdrawal is a group of symptoms that can happen if you have been taking opioids and then stop taking them. Opioid withdrawal can also happen:  When the dosage is decreased.  After stopping a prescription opioid as directed. Physical dependence can develop after taking opioids regularly for just a few days.  After taking a prescription opioid incorrectly, such as taking more than recommended or taking it for a different purpose (opioid misuse).  Taking the opioid illegally and then stopping. Opioid withdrawal is not usually life-threatening, but it can be very uncomfortable and severe. Opioid withdrawal treatment makes managing withdrawal easier. What are the signs and symptoms of opioid withdrawal? Symptoms of this condition can be both physical and emotional. Physical symptoms include:  Nausea and vomiting.  Muscle aches or spasms.  Watery eyes and runny nose.  Widening of the dark centers of the eyes (dilated pupils).  Hair standing on end.  Fever and sweating.  Flushing and itching of the skin.  Stomach cramping and diarrhea.  Trouble sleeping. Emotional symptoms include:  Depression.  Anxiety and nervousness.  Restlessness, irritability, and severe mood swings. When symptoms start and how long they last depend on the kind of opioid you have been taking.  Short-acting opioids, such as heroin or oxycodone, work fast and then lose their effect quickly. Symptoms occur within hours of stopping or reducing the amount you take. The worst symptoms (peak withdrawal) occur in 2-3 days. Overall, symptoms should lessen in 7-10 days.   Long-acting opioids, such as methadone, work for a longer period of time. Symptoms can occur within 1-3 days of stopping or reducing the amount you take. The worst symptoms occur in 3-8 days. Symptoms may continue for several weeks but will be milder. There are also medicines that block the effects of opioids (opioid antagonists), such as naloxone. Naloxone is given to restore a person's breathing if it has slowed down or stopped due to an opioid overdose. The effect includes withdrawal symptoms that begin within minutes and last for about an hour. It is important to get medical help in this situation. How is this treated? Treatment for opioid withdrawal usually happens:  In an emergency department.  In a clinic or drug treatment facility.  At home.  In a combination of different settings. Treatment for this condition depends on the type of opioids that are causing your symptoms. Medicines  Opioid or opioid-like medicine. This is the most effective treatment. It is used to block withdrawal symptoms, and then the dose is lowered over time.  If you have been taking prescription opioid medicine, your health care provider may lower the dose over several days.  If you have opioid use disorder, your health care provider may give you the following medicines to block withdrawals: ? Methadone. It is given at a certain dose. The dose is then slowly lowered over 6-10 days. ? Buprenorphine. It is given at a certain dose. The dose is then lowered over 3-5 days. In some cases, it may be lowered very slowly over 30 days.  Other medicines may also be used as a  stand-alone treatment or in combination with other treatments. These include: ? Alpha-2 adrenergic agonists. These medicines block a brain chemical (norepinephrine) that causes some withdrawal symptoms. They can reduce and lessen the severity of symptoms but do not stop them. The medicines can cause side effects such as drowsiness, dizziness, and  low blood pressure. ? Other medicines to reduce anxiety, relieve muscle aches, promote sleep, and decrease digestive symptoms like nausea and diarrhea. Other treatments   Counseling. This treatment is also called talk therapy. It is provided by treatment counselors and is used in addition to medicines.  Support groups. These groups provide emotional support, advice, and guidance during recovery. They offer a safe environment in which you can talk to present and past opioid users and those who have gone through treatment. Follow these instructions at home:  Always check with your health care provider before starting any new medicines. Many of the medicines used to treat opioid withdrawal can have dangerous side effects and should only be taken as prescribed by your health care provider.  Do not start using an opioid medicine again without talking to a health care provider. You may be at an increased risk for problems, including opioid overdose.  If you have chronic pain, ask your health care provider about an intensive pain rehabilitation program or a referral to a chronic pain specialist. You may need to work with a pain specialist to come up with a treatment plan to help manage pain.  Keep all follow-up visits as told by your health care provider. This is important. Contact a health care provider if:  You need help stopping use of an opioid medicine.  You have been on long-term opioids and are planning to stop, and you would like to prepare for any withdrawal symptoms.  You have been treated at home, but you are still having symptoms of withdrawal.  You have been treated for withdrawal, but you have started using opioids again (relapsed). Get help right away if you:  Have chest pain and shortness of breath.  Are drowsy and have difficulty staying awake.  Feel weak or dizzy, or feel like you are going to pass out.  Have nausea or vomiting that does not go away, or you begin to vomit  blood.  Have diarrhea that does not stop, or you begin to feel weak and light-headed.  Feel severely depressed and anxious.  Feel you may be a harm to yourself or others. These symptoms may represent a serious problem that is an emergency. Do not wait to see if the symptoms will go away. Get medical help right away. Call your local emergency services (911 in the [place] Do not drive yourself to the hospital. Summary  Opioid withdrawal is a group of symptoms that can happen if you have been taking opioids and then stop taking them.  Opioid withdrawal symptoms are treated using medicines, counseling, and support groups.  The most effective treatment is to use an opioid or opioid-like drug to block withdrawal symptoms and gradually lower the dose over time. This information is not intended to replace advice given to you by your health care provider. Make sure you discuss any questions you have with your health care provider. Document Released: 12/28/2017 Document Revised: 12/28/2017 Document Reviewed: 12/28/2017 Elsevier Interactive Patient Education  2019 [employer].

## 2019-01-21 ENCOUNTER — Ambulatory Visit (INDEPENDENT_AMBULATORY_CARE_PROVIDER_SITE_OTHER): Payer: Self-pay | Admitting: Family Medicine

## 2019-01-21 ENCOUNTER — Encounter: Payer: Self-pay | Admitting: Family Medicine

## 2019-01-21 VITALS — BP 124/77 | HR 85 | Temp 98.3°F | Wt 112.0 lb

## 2019-01-21 DIAGNOSIS — F1193 Opioid use, unspecified with withdrawal: Secondary | ICD-10-CM

## 2019-01-21 DIAGNOSIS — F1123 Opioid dependence with withdrawal: Secondary | ICD-10-CM

## 2019-01-21 MED ORDER — QUETIAPINE FUMARATE 50 MG PO TABS: 25.0000 mg | ORAL_TABLET | Freq: Every day | ORAL | 1 refills | Status: DC

## 2019-01-21 NOTE — Patient Instructions (Addendum)
Thank you for coming in today.  Ok to increase clonidine up to 0.2mg  at bedtime.  If you cannot sleep we can add other medicines.   I do think you will get benefit from addition therapy. We will keep looking to find options including possibly one with [facility].   Also apply for the [facility] assistance program at [facility].   Try Seroquel at bedtime. OK to take 1/2 tab up to 2 tabs. (Start with 1/2 tab)  These symptoms should last and be gone by 1 week or so.  It will not last for ever.    Opioid Withdrawal Treatment Opioid withdrawal is a group of symptoms that can happen if you have been taking opioids and then stop taking them. Opioid withdrawal can also happen:  When the dosage is decreased.  After stopping a prescription opioid as directed. Physical dependence can develop after taking opioids regularly for just a few days.  After taking a prescription opioid incorrectly, such as taking more than recommended or taking it for a different purpose (opioid misuse).  Taking the opioid illegally and then stopping. Opioid withdrawal is not usually life-threatening, but it can be very uncomfortable and severe. Opioid withdrawal treatment makes managing withdrawal easier. What are the signs and symptoms of opioid withdrawal? Symptoms of this condition can be both physical and emotional. Physical symptoms include:  Nausea and vomiting.  Muscle aches or spasms.  Watery eyes and runny nose.  Widening of the dark centers of the eyes (dilated pupils).  Hair standing on end.  Fever and sweating.  Flushing and itching of the skin.  Stomach cramping and diarrhea.  Trouble sleeping. Emotional symptoms include:  Depression.  Anxiety and nervousness.  Restlessness, irritability, and severe mood swings. When symptoms start and how long they last depend on the kind of opioid you have been taking.  Short-acting opioids, such as heroin or oxycodone, work fast and then lose their  effect quickly. Symptoms occur within hours of stopping or reducing the amount you take. The worst symptoms (peak withdrawal) occur in 2-3 days. Overall, symptoms should lessen in 7-10 days.  Long-acting opioids, such as methadone, work for a longer period of time. Symptoms can occur within 1-3 days of stopping or reducing the amount you take. The worst symptoms occur in 3-8 days. Symptoms may continue for several weeks but will be milder. There are also medicines that block the effects of opioids (opioid antagonists), such as naloxone. Naloxone is given to restore a person's breathing if it has slowed down or stopped due to an opioid overdose. The effect includes withdrawal symptoms that begin within minutes and last for about an hour. It is important to get medical help in this situation. How is this treated? Treatment for opioid withdrawal usually happens:  In an emergency department.  In a clinic or drug treatment facility.  At home.  In a combination of different settings. Treatment for this condition depends on the type of opioids that are causing your symptoms. Medicines  Opioid or opioid-like medicine. This is the most effective treatment. It is used to block withdrawal symptoms, and then the dose is lowered over time.  If you have been taking prescription opioid medicine, your health care provider may lower the dose over several days.  If you have opioid use disorder, your health care provider may give you the following medicines to block withdrawals: ? Methadone. It is given at a certain dose. The dose is then slowly lowered over 6-10 days. ?  Buprenorphine. It is given at a certain dose. The dose is then lowered over 3-5 days. In some cases, it may be lowered very slowly over 30 days.  Other medicines may also be used as a stand-alone treatment or in combination with other treatments. These include: ? Alpha-2 adrenergic agonists. These medicines block a brain chemical  (norepinephrine) that causes some withdrawal symptoms. They can reduce and lessen the severity of symptoms but do not stop them. The medicines can cause side effects such as drowsiness, dizziness, and low blood pressure. ? Other medicines to reduce anxiety, relieve muscle aches, promote sleep, and decrease digestive symptoms like nausea and diarrhea. Other treatments   Counseling. This treatment is also called talk therapy. It is provided by treatment counselors and is used in addition to medicines.  Support groups. These groups provide emotional support, advice, and guidance during recovery. They offer a safe environment in which you can talk to present and past opioid users and those who have gone through treatment. Follow these instructions at home:  Always check with your health care provider before starting any new medicines. Many of the medicines used to treat opioid withdrawal can have dangerous side effects and should only be taken as prescribed by your health care provider.  Do not start using an opioid medicine again without talking to a health care provider. You may be at an increased risk for problems, including opioid overdose.  If you have chronic pain, ask your health care provider about an intensive pain rehabilitation program or a referral to a chronic pain specialist. You may need to work with a pain specialist to come up with a treatment plan to help manage pain.  Keep all follow-up visits as told by your health care provider. This is important. Contact a health care provider if:  You need help stopping use of an opioid medicine.  You have been on long-term opioids and are planning to stop, and you would like to prepare for any withdrawal symptoms.  You have been treated at home, but you are still having symptoms of withdrawal.  You have been treated for withdrawal, but you have started using opioids again (relapsed). Get help right away if you:  Have chest pain and  shortness of breath.  Are drowsy and have difficulty staying awake.  Feel weak or dizzy, or feel like you are going to pass out.  Have nausea or vomiting that does not go away, or you begin to vomit blood.  Have diarrhea that does not stop, or you begin to feel weak and light-headed.  Feel severely depressed and anxious.  Feel you may be a harm to yourself or others. These symptoms may represent a serious problem that is an emergency. Do not wait to see if the symptoms will go away. Get medical help right away. Call your local emergency services (911 in the [place] Do not drive yourself to the hospital. Summary  Opioid withdrawal is a group of symptoms that can happen if you have been taking opioids and then stop taking them.  Opioid withdrawal symptoms are treated using medicines, counseling, and support groups.  The most effective treatment is to use an opioid or opioid-like drug to block withdrawal symptoms and gradually lower the dose over time. This information is not intended to replace advice given to you by your health care provider. Make sure you discuss any questions you have with your health care provider. Document Released: 12/28/2017 Document Revised: 12/28/2017 Document Reviewed: 12/28/2017 Elsevier Interactive Patient  Education  2019 [employer].   Quetiapine tablets What is this medicine? QUETIAPINE (kwe TYE a peen) is an antipsychotic. It is used to treat schizophrenia and bipolar disorder, also known as manic-depression. This medicine may be used for other purposes; ask your health care provider or pharmacist if you have questions. COMMON BRAND NAME(S): Seroquel What should I tell my health care provider before I take this medicine? They need to know if you have any of these conditions: -blockage in your bowel -cataracts -constipation -dehydration -diabetes -difficulty swallowing -glaucoma -heart disease -history of breast cancer -kidney disease -liver  disease -low blood counts, like low white cell, platelet, or red cell counts -low blood pressure or dizziness when standing up -Parkinson's disease -previous heart attack -prostate disease -seizures -stomach or intestine problems -suicidal thoughts, plans or attempt; a previous suicide attempt by you or a family member -thyroid disease -trouble passing urine -an unusual or allergic reaction to quetiapine, other medicines, foods, dyes, or preservatives -pregnant or trying to get pregnant -breast-feeding How should I use this medicine? Take this medicine by mouth. Swallow it with a drink of water. Follow the directions on the prescription label. If it upsets your stomach you can take it with food. Take your medicine at regular intervals. Do not take it more often than directed. Do not stop taking except on the advice of your doctor or health care professional. A special MedGuide will be given to you by the pharmacist with each prescription and refill. Be sure to read this information carefully each time. Talk to your pediatrician regarding the use of this medicine in children. While this drug may be prescribed for children as young as 10 years for selected conditions, precautions do apply. Patients over age 31 years may have a stronger reaction to this medicine and need smaller doses. Overdosage: If you think you have taken too much of this medicine contact a poison control center or emergency room at once. NOTE: This medicine is only for you. Do not share this medicine with others. What if I miss a dose? If you miss a dose, take it as soon as you can. If it is almost time for your next dose, take only that dose. Do not take double or extra doses. What may interact with this medicine? Do not take this medicine with any of the following medications: -cisapride -dofetilide -dronedarone -fluconazole -metoclopramide -pimozide -posaconazole -thioridazine This medicine may also interact with  the following medications: -alcohol -antihistamines for allergy cough and cold -antiviral medicines for HIV or AIDS -atropine -certain medicines for bladder problems like oxybutynin, tolterodine -certain medicines for blood pressure -certain medicines for depression, anxiety, or psychotic disturbances -certain medicines for diabetes -certain medicines for stomach problems like dicyclomine, hyoscyamine -certain medicines for travel sickness like scopolamine -certain medicines for Parkinson's disease -certain medicines for seizures like carbamazepine, phenobarbital, phenytoin -cimetidine -erythromycin -ipratropium -other medicines that prolong the QT interval (cause an abnormal heart rhythm) -rifampin -steroid medicines like prednisone or cortisone This list may not describe all possible interactions. Give your health care provider a list of all the medicines, herbs, non-prescription drugs, or dietary supplements you use. Also tell them if you smoke, drink alcohol, or use illegal drugs. Some items may interact with your medicine. What should I watch for while using this medicine? Visit your doctor or health care professional for regular checks on your progress. It may be several weeks before you see the full effects of this medicine. Your health care provider may suggest that  you have your eyes examined prior to starting this medicine, and every 6 months thereafter. If you have been taking this medicine regularly for some time, do not suddenly stop taking it. You must gradually reduce the dose or your symptoms may get worse. Ask your doctor or health care professional for advice. Patients and their families should watch out for worsening depression or thoughts of suicide. Also watch out for sudden or severe changes in feelings such as feeling anxious, agitated, panicky, irritable, hostile, aggressive, impulsive, severely restless, overly excited and hyperactive, or not being able to sleep. If  this happens, especially at the beginning of antidepressant treatment or after a change in dose, call your health care professional. Bonita Quin may get dizzy or drowsy. Do not drive, use machinery, or do anything that needs mental alertness until you know how this medicine affects you. Do not stand or sit up quickly, especially if you are an older patient. This reduces the risk of dizzy or fainting spells. Alcohol can increase dizziness and drowsiness. Avoid alcoholic drinks. Do not treat yourself for colds, diarrhea or allergies. Ask your doctor or health care professional for advice, some ingredients may increase possible side effects. This medicine can reduce the response of your body to heat or cold. Dress warm in cold weather and stay hydrated in hot weather. If possible, avoid extreme temperatures like saunas, hot tubs, very hot or cold showers, or activities that can cause dehydration such as vigorous exercise. What side effects may I notice from receiving this medicine? Side effects that you should report to your doctor or health care professional as soon as possible: -allergic reactions like skin rash, itching or hives, swelling of the face, lips, or tongue -changes in vision -difficulty swallowing -elevated mood, decreased need for sleep, racing thoughts, impulsive behavior -eye pain -redness, blistering, peeling, or loosening of the skin, including inside the mouth -restlessness, pacing, inability to keep still -seizures -signs and symptoms of a dangerous change in heartbeat or heart rhythm like chest pain; dizziness; fast, irregular heartbeat; palpitations; feeling faint or lightheaded; falls; breathing problems -signs and symptoms of high blood sugar such as dizziness; dry mouth; dry skin; fruity breath; nausea; stomach pain; increased hunger; increased thirst; increased urination -signs and symptoms of hypothyroidism like fatigue; increased sensitivity to cold; weight gain; hoarseness;  thinning hair -signs and symptoms of infection like fever; chills; cough; sore throat; pain or trouble passing urine -signs and symptoms of low blood pressure like dizziness; feeling faint or lightheaded; falls; unusually weak or tired -signs and symptoms of neuroleptic malignant syndrome (NMS) like confusion; fast, irregular heartbeat; high fever; increased sweating; stiff muscles -signs and symptoms of a stroke like changes in vision; confusion; trouble speaking or understanding; severe headaches; sudden numbness or weakness of the face, arm or leg; trouble walking; dizziness; loss of balance or coordination -signs and symptoms of tardive dyskinesia, like uncontrollable head, mouth, neck, arm, or leg movements -suicidal thoughts, mood changes Side effects that usually do not require medical attention (report to your doctor or health care professional if they continue or are bothersome): -change in sex drive or performance -constipation -drowsiness -dry mouth -upset stomach -weight gain This list may not describe all possible side effects. Call your doctor for medical advice about side effects. You may report side effects to FDA at 1-800-FDA-1088. Where should I keep my medicine? Keep out of the reach of children. Store at room temperature between 15 and 30 degrees C (59 and 86 degrees F). Throw away  any unused medicine after the expiration date. NOTE: This sheet is a summary. It may not cover all possible information. If you have questions about this medicine, talk to your doctor, pharmacist, or health care provider.  2019 Elsevier/Gold Standard (2017-09-01 14:16:00)

## 2019-01-21 NOTE — Progress Notes (Signed)
Amanda Vazquez is a 28 y.o. female who presents to [facility]: Primary Care Sports Medicine today for follow-up opiate withdrawal.  Amanda Vazquez is been seen multiple times for abdominal pain starting in early April.  She was prescribed narcotics for pain control and since then has had symptoms that are consistent with opiate withdrawal.  She notes feeling diarrhea and abdominal cramping feeling restless and had trouble sleeping feeling like her skin is crawling and feeling agitated and irritable.  Her last dose of opiates was Friday night.  On Friday during an office visit she was [facility] [place] however cannot be seen for quite a while.   ROS as above:  Exam:  BP 124/77   Pulse 85   Temp 98.3 F (36.8 C) (Oral)   Wt 112 lb (50.8 kg)   LMP 12/11/2014 (Approximate)   BMI 20.49 kg/m  Wt Readings from Last 5 Encounters:  01/21/19 112 lb (50.8 kg)  01/18/19 115 lb (52.2 kg)  01/15/19 110 lb (49.9 kg)  01/08/19 115 lb (52.2 kg)  01/07/19 116 lb (52.6 kg)    Gen: Well NAD HEENT: EOMI,  MMM Lungs: Normal work of breathing. CTABL Heart: RRR no MRG Abd: NABS, Soft. Nondistended, Nontender Exts: Brisk capillary refill, warm and well perfused.     Assessment and Plan: 28 y.o. female with opiate withdrawal.  Patient is still symptomatic but looks better today than she has in the past.  We will continue to see each out other potential opportunities for addiction medicine and she may benefit from this in the past.  In the interim okay to increase clonidine to 0.2 mg.  Will use Seroquel at bedtime to help with sleep and limit benzodiazepine use.  Discussed that her symptoms will likely last less than a week and she will start feeling better pretty soon.  Patient will keep me updated.  PDMP reviewed during this encounter. No orders of the defined  types were placed in this encounter.  Meds ordered this encounter  Medications  . QUEtiapine (SEROQUEL) 50 MG tablet    Sig: Take 0.5-2 tablets (25-100 mg total) by mouth at bedtime.    Dispense:  30 tablet    Refill:  1     Historical information moved to improve visibility of documentation.  Past Medical History:  Diagnosis Date  . Common migraine with intractable migraine 12/16/2016  . Endometriosis   . Frequency of urination   . Migraine   . Pelvic pain in female   . Polycystic ovary disease    Past Surgical History:  Procedure Laterality Date  . ABDOMINAL HYSTERECTOMY    . DX LAPAROSCOPY W/ LASER ABLATION OF ENDOMETRIOSIS  06-12-2014   [facility]  . LAPAROSCOPIC OVARIAN CYSTECTOMY Left 01/08/2015   Procedure: LAPAROSCOPIC OVARIAN CYSTECTOMY;  Surgeon: Richardean Chimera, MD;  Location: [facility];  Service: Gynecology;  Laterality: Left;  . LAPAROSCOPY N/A 01/08/2015   Procedure: LAPAROSCOPY DIAGNOSTIC;  Surgeon: Richardean Chimera, MD;  Location: [facility];  Service: Gynecology;  Laterality: N/A;  . TONSILLECTOMY  age 38   Social History   Tobacco Use  . Smoking status: Never Smoker  . Smokeless tobacco: Never Used  Substance Use Topics  . Alcohol use: Yes    Comment: occasional   family history includes Alcohol abuse in her mother; Cancer in her maternal grandfather, maternal grandmother, and paternal grandfather; Diabetes in her maternal  grandfather and sister; Hypertension in her maternal grandfather and paternal grandmother.  Medications: Current Outpatient Medications  Medication Sig Dispense Refill  . acetaminophen (TYLENOL) 325 MG suppository Place 2 suppositories (650 mg total) rectally every 8 (eight) hours as needed for moderate pain. 30 suppository 0  . baclofen (LIORESAL) 10 MG tablet Take 1 tablet (10 mg total) by mouth 3 (three) times daily as needed (abd spasm). 30 each 1  . buPROPion (WELLBUTRIN XL) 300 MG 24 hr  tablet Take 1 tablet (300 mg total) by mouth every morning. 90 tablet 1  . clonazePAM (KLONOPIN) 0.5 MG tablet Take 1 tablet (0.5 mg total) by mouth 2 (two) times daily as needed for anxiety. 30 tablet 0  . cloNIDine (CATAPRES) 0.1 MG tablet Take 1 tablet (0.1 mg total) by mouth at bedtime. 30 tablet 1  . diazepam (VALIUM) 5 MG tablet PLACE 1 TABLET VAGINALLY THE NIGHT BEFORE PROCEDURE 5 tablet 0  . dicyclomine (BENTYL) 10 MG capsule TK 1 C PO 30 MIN B MEALS AND HS FOR 5 DAYS    . diphenoxylate-atropine (LOMOTIL) 2.5-0.025 MG tablet Take by mouth.    . hydrOXYzine (ATARAX/VISTARIL) 25 MG tablet Take by mouth.    Marland Kitchen. ibuprofen (ADVIL,MOTRIN) 800 MG tablet Take 800 mg by mouth every 8 (eight) hours as needed for headache or moderate pain.    Marland Kitchen. loperamide (IMODIUM) 2 MG capsule Take 2 mg by mouth as needed for diarrhea or loose stools.    . norethindrone (AYGESTIN) 5 MG tablet Take 1 tablet (5 mg total) by mouth daily for 30 days. 30 tablet 0  . ondansetron (ZOFRAN) 4 MG tablet Take 1 tablet (4 mg total) by mouth every 8 (eight) hours as needed for nausea or vomiting. 20 tablet 12  . PARoxetine (PAXIL) 30 MG tablet Take 1 tablet (30 mg total) by mouth daily. 90 tablet 1  . promethazine (PHENERGAN) 25 MG suppository Place 1 suppository (25 mg total) rectally every 6 (six) hours as needed for nausea. 12 each 0  . promethazine (PHENERGAN) 25 MG tablet TAKE 1 TABLET(25 MG) BY MOUTH EVERY 8 HOURS AS NEEDED FOR NAUSEA OR VOMITING (Patient taking differently: Take 25 mg by mouth every 8 (eight) hours as needed for nausea or vomiting. TAKE 1 TABLET(25 MG) BY MOUTH EVERY 8 HOURS AS NEEDED FOR NAUSEA OR VOMITING) 30 tablet 1  . QUEtiapine (SEROQUEL) 50 MG tablet Take 0.5-2 tablets (25-100 mg total) by mouth at bedtime. 30 tablet 1   No current facility-administered medications for this visit.    Allergies  Allergen Reactions  . Buprenorphine Nausea Only     Discussed warning signs or symptoms. Please see  discharge instructions. Patient expresses understanding.

## 2019-01-23 ENCOUNTER — Telehealth (HOSPITAL_COMMUNITY): Payer: Self-pay | Admitting: Psychology

## 2019-01-25 ENCOUNTER — Other Ambulatory Visit: Payer: Self-pay | Admitting: Family Medicine

## 2019-01-27 ENCOUNTER — Encounter: Payer: Self-pay | Admitting: Family Medicine

## 2019-02-08 NOTE — Addendum Note (Signed)
Addended by: Mallie Snooks R on: 02/08/2019 10:05 AM   Modules accepted: Orders

## 2019-02-20 ENCOUNTER — Other Ambulatory Visit: Payer: Self-pay | Admitting: Family Medicine

## 2019-02-20 NOTE — Telephone Encounter (Signed)
Last OV was 01/21/2019. Please advise.  

## 2019-03-04 ENCOUNTER — Ambulatory Visit (INDEPENDENT_AMBULATORY_CARE_PROVIDER_SITE_OTHER): Payer: Self-pay | Admitting: Family Medicine

## 2019-03-04 ENCOUNTER — Ambulatory Visit (INDEPENDENT_AMBULATORY_CARE_PROVIDER_SITE_OTHER): Payer: Self-pay

## 2019-03-04 ENCOUNTER — Other Ambulatory Visit: Payer: Self-pay

## 2019-03-04 ENCOUNTER — Encounter: Payer: Self-pay | Admitting: Family Medicine

## 2019-03-04 VITALS — BP 155/87 | HR 99 | Temp 98.9°F | Wt 118.0 lb

## 2019-03-04 DIAGNOSIS — M25522 Pain in left elbow: Secondary | ICD-10-CM

## 2019-03-04 MED ORDER — BACLOFEN 10 MG PO TABS: 10.0000 mg | ORAL_TABLET | Freq: Three times a day (TID) | ORAL | 1 refills | Status: DC | PRN

## 2019-03-04 NOTE — Patient Instructions (Signed)
Thank you for coming in today. Advance activity as tolerated.  Recheck with me if not improving.  Keep me updated on Mychart.    Contusion  A contusion is a deep bruise. Contusions happen when an injury causes bleeding under the skin. Symptoms of bruising include pain, swelling, and discolored skin. The skin may turn blue, purple, or yellow. Follow these instructions at home:  Rest the injured area.  If told, put ice on the injured area. ? Put ice in a plastic bag. ? Place a towel between your skin and the bag. ? Leave the ice on for 20 minutes, 2-3 times per day.  If told, put light pressure (compression) on the injured area using an elastic bandage. Make sure the bandage is not too tight. Remove it and put it back on as told by your doctor.  If possible, raise (elevate) the injured area above the level of your heart while you are sitting or lying down.  Take over-the-counter and prescription medicines only as told by your doctor. Contact a doctor if:  Your symptoms do not get better after several days of treatment.  Your symptoms get worse.  You have trouble moving the injured area. Get help right away if:  You have very bad pain.  You have a loss of feeling (numbness) in a hand or foot.  Your hand or foot turns pale or cold. This information is not intended to replace advice given to you by your health care provider. Make sure you discuss any questions you have with your health care provider. Document Released: 02/29/2008 Document Revised: 02/18/2016 Document Reviewed: 01/28/2015 Elsevier Interactive Patient Education  2019 [employer].
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
## 2019-03-18 ENCOUNTER — Other Ambulatory Visit: Payer: Self-pay | Admitting: Family Medicine

## 2019-03-18 NOTE — Telephone Encounter (Signed)
Will forward to provider for review.

## 2019-03-23 ENCOUNTER — Other Ambulatory Visit: Payer: Self-pay | Admitting: Family Medicine

## 2019-03-26 ENCOUNTER — Other Ambulatory Visit: Payer: Self-pay | Admitting: Family Medicine

## 2019-04-08 ENCOUNTER — Other Ambulatory Visit: Payer: Self-pay | Admitting: Family Medicine

## 2019-04-17 ENCOUNTER — Other Ambulatory Visit: Payer: Self-pay | Admitting: Family Medicine

## 2019-04-21 ENCOUNTER — Other Ambulatory Visit: Payer: Self-pay | Admitting: Family Medicine

## 2019-05-01 ENCOUNTER — Other Ambulatory Visit: Payer: Self-pay | Admitting: Family Medicine

## 2019-05-10 ENCOUNTER — Encounter: Payer: Self-pay | Admitting: Family Medicine

## 2019-05-10 ENCOUNTER — Other Ambulatory Visit: Payer: Self-pay

## 2019-05-10 ENCOUNTER — Ambulatory Visit (INDEPENDENT_AMBULATORY_CARE_PROVIDER_SITE_OTHER): Payer: Self-pay | Admitting: Family Medicine

## 2019-05-10 VITALS — BP 133/89 | HR 87 | Temp 98.7°F | Wt 136.5 lb

## 2019-05-10 DIAGNOSIS — M76892 Other specified enthesopathies of left lower limb, excluding foot: Secondary | ICD-10-CM

## 2019-05-10 DIAGNOSIS — R102 Pelvic and perineal pain: Secondary | ICD-10-CM

## 2019-05-10 LAB — POCT URINALYSIS DIPSTICK
Bilirubin, UA: NEGATIVE
Blood, UA: NEGATIVE
Glucose, UA: NEGATIVE
Ketones, UA: NEGATIVE
Leukocytes, UA: NEGATIVE
Nitrite, UA: NEGATIVE
Protein, UA: NEGATIVE
Spec Grav, UA: 1.015 (ref 1.010–1.025)
Urobilinogen, UA: 0.2 E.U./dL
pH, UA: 7 (ref 5.0–8.0)

## 2019-05-10 LAB — POCT URINE PREGNANCY: Preg Test, Ur: NEGATIVE

## 2019-05-10 MED ORDER — CYCLOBENZAPRINE HCL 10 MG PO TABS: 10.0000 mg | ORAL_TABLET | Freq: Three times a day (TID) | ORAL | 0 refills | Status: DC | PRN

## 2019-05-10 MED ORDER — ALPRAZOLAM 0.25 MG PO TABS: 0.2500 mg | ORAL_TABLET | Freq: Two times a day (BID) | ORAL | 1 refills | Status: DC | PRN

## 2019-05-10 NOTE — Progress Notes (Signed)
Amanda Vazquez is a 28 y.o. female who [facility] today for left pelvis/groin and back pain.  Started about 4 to 5 days ago Hospital doctorAmber was in her normal state of health.  She developed some pain in her left lower pelvis into her groin and back.  This is worse with activity and better with rest.  She notes laying down she feels better if her hip is flexed a little.  She denies any change to bowel bladder.  She has about 2 stools per day with no blood or mucus.   She denies any fevers chills nausea vomiting diarrhea.  She denies any urinary frequency urgency or dysuria or blood in the urine.  Symptoms are not consistent prior episodes of kidney stone.  She is been taking ibuprofen which helps some.  Also she has been taking her baclofen which has been quite helpful for her chronic abdominal and pelvis pain.  This has not helped much for this current episode.  She notes that she tried some leftover Flexeril which did help.   Also of note patient has a history of opiate addiction but not manipulative behavior or abuse.  She notes that she has been clean for quite some time now and is very happy with how she feels.  She is very thankful.  ROS as above:  Exam:  BP 133/89 (BP Location: Left Arm, Patient Position: Sitting, Cuff Size: Normal)   Pulse 87   Temp 98.7 F (37.1 C) (Oral)   Wt 136 lb 8 oz (61.9 kg)   LMP 12/11/2014 (Approximate)   BMI 24.97 kg/m  Wt Readings from Last 5 Encounters:  05/10/19 136 lb 8 oz (61.9 kg)  03/04/19 118 lb (53.5 kg)  01/21/19 112 lb (50.8 kg)  01/18/19 115 lb (52.2 kg)  01/15/19 110 lb (49.9 kg)    Gen: Well NAD HEENT: EOMI,  MMM Lungs: Normal work of breathing. CTABL Heart: RRR no MRG Abd: NABS, Soft. Nondistended, mildly tender palpation left lower abdominal quadrant with no rebound or guarding.  No masses palpated.  Exts: Brisk capillary refill, warm and well perfused.  MSK: Patient has pain-free passive range of motion of left hip.  Range of motion is full.  However active hip flexion is painful. Strength is intact hip motion.  Some pain with resisted hip flexion. Pulses cap refill sensation intact distally.  L-spine nontender to spinal midline.  Normal lumbar motion.  Some pain with extension.  Lab and Radiology Results Results for orders placed or performed in visit on 05/10/19 (from the past 72 hour(s))  POCT Urinalysis Dipstick     Status: None   Collection Time: 05/10/19 11:52 AM  Result Value Ref Range   Color, UA Yellow    Clarity, UA Clear    Glucose, UA Negative Negative   Bilirubin, UA Negative    Ketones, UA Negative    Spec Grav, UA 1.015 1.010 - 1.025   Blood, UA Negative    pH, UA 7.0 5.0 - 8.0   Protein, UA Negative Negative   Urobilinogen, UA 0.2 0.2 or 1.0 E.U./dL   Nitrite, UA Negative    Leukocytes, UA Negative Negative   Appearance     Odor    POCT urine pregnancy     Status: None   Collection Time: 05/10/19 11:52 AM  Result Value Ref Range   Preg Test, Ur Negative Negative   No results found.  Assessment and Plan: 28 y.o. female with left lower abdominal pain pelvis pain.  Patient has pain with activation of hip flexors but has normal passive motion.  I think it is likely that she is experiencing discomfort in her hip flexors including iliopsoas.  She is had problems with muscle spasms and strains in and around her pelvis.  She is had extensive pelvic physical therapy in the past with good benefit.   Plan to work on home exercise program focusing on hip flexor stretching and strengthening.  Precautions reviewed.  Based on some benefit of Flexeril reasonable to prescribe that medication.  Cautioned with use with Xanax and with baclofen.   Of note patient needs refill of Xanax.  She is traveling to [place] on Monday and will need to get a little bit early.  Okay to  prescribe that way.  Also of note patient has a history of opiate addiction but not manipulative behavior or abuse.  She notes that she has been clean for quite some time now and is very happy with how she feels.  She is very thankful.  Lastly informed patient that in November of be transition[place] sports medicine [place] in [place].  Recommended some providers to transfer primary care needs to.  Still happy to see her for sports medicine issues including the hip flexor issue/pelvis pain as noted above.  PDMP not reviewed this encounter. Orders Placed This Encounter  Procedures  . POCT Urinalysis Dipstick  . POCT urine pregnancy   No orders of the defined types were placed in this encounter.    Historical information moved to improve visibility of documentation.  Past Medical History:  Diagnosis Date  . Common migraine with intractable migraine 12/16/2016  . Endometriosis   . Frequency of urination   . Migraine   . Pelvic pain in female   . Polycystic ovary disease    Past Surgical History:  Procedure Laterality Date  . ABDOMINAL HYSTERECTOMY    . DX LAPAROSCOPY W/ LASER ABLATION OF ENDOMETRIOSIS  06-12-2014   [facility]  . LAPAROSCOPIC OVARIAN CYSTECTOMY Left 01/08/2015   Procedure: LAPAROSCOPIC OVARIAN CYSTECTOMY;  Surgeon: Richardean ChimeraJohn McComb, MD;  [facility] [place];  Service: Gynecology;  Laterality: Left;  . LAPAROSCOPY N/A 01/08/2015   Procedure: LAPAROSCOPY DIAGNOSTIC;  Surgeon: Richardean ChimeraJohn McComb, MD;  [facility] [place];  Service: Gynecology;  Laterality: N/A;  . TONSILLECTOMY  age 28   Social History   Tobacco Use  . Smoking status: Never Smoker  . Smokeless tobacco: Never Used  Substance Use Topics  . Alcohol use: Yes    Comment: occasional   family history includes Alcohol abuse in her mother; Cancer in her maternal grandfather, maternal grandmother, and paternal grandfather; Diabetes in her maternal grandfather and sister;  Hypertension in her maternal grandfather and paternal grandmother.  Medications: Current Outpatient Medications  Medication Sig Dispense Refill  . acetaminophen (TYLENOL) 325 MG suppository Place 2 suppositories (650 mg total) rectally every 8 (eight) hours as needed for moderate pain. 30 suppository 0  . ALPRAZolam (XANAX) 0.25 MG tablet TAKE 1 TABLET BY MOUTH TWICE DAILY AS NEEDED FOR ANXIETY 60 tablet 1  . baclofen (LIORESAL) 10 MG tablet TAKE 1 TABLET BY MOUTH THREE TIMES DAILY AS NEEDED FOR ABDOMINAL SPASM 30 tablet 0  . cloNIDine (CATAPRES) 0.1 MG tablet TAKE 1 TABLET BY MOUTH AT BEDTIME 30 tablet 1  . dicyclomine (BENTYL) 10 MG capsule TK 1 C PO 30 MIN B MEALS AND  HS FOR 5 DAYS    . ibuprofen (ADVIL,MOTRIN) 800 MG tablet Take 800 mg by mouth every 8 (eight) hours as needed for headache or moderate pain.    Marland Kitchen PARoxetine (PAXIL) 30 MG tablet Take 1 tablet (30 mg total) by mouth daily. 90 tablet 1  . promethazine (PHENERGAN) 25 MG tablet TAKE 1 TABLET BY MOUTH EVERY 8 HOURS AS NEEDED FOR NAUSEA OR VOMITING 30 tablet 5  . QUEtiapine (SEROQUEL) 50 MG tablet TAKE 1/2 TO 2 TABLETS BY MOUTH EVERY NIGHT AT BEDTIME 30 tablet 1  . buPROPion (WELLBUTRIN XL) 300 MG 24 hr tablet Take 1 tablet (300 mg total) by mouth every morning. (Patient not taking: Reported on 05/10/2019) 90 tablet 1  . clonazePAM (KLONOPIN) 0.5 MG tablet Take 1 tablet (0.5 mg total) by mouth 2 (two) times daily as needed for anxiety. (Patient not taking: Reported on 05/10/2019) 30 tablet 0  . diazepam (VALIUM) 5 MG tablet PLACE 1 TABLET VAGINALLY THE NIGHT BEFORE PROCEDURE (Patient not taking: Reported on 05/10/2019) 5 tablet 0  . diphenoxylate-atropine (LOMOTIL) 2.5-0.025 MG tablet Take by mouth.    . loperamide (IMODIUM) 2 MG capsule Take 2 mg by mouth as needed for diarrhea or loose stools.    . norethindrone (AYGESTIN) 5 MG tablet Take 1 tablet (5 mg total) by mouth daily for 30 days. 30 tablet 0  . ondansetron (ZOFRAN) 4 MG tablet  Take 1 tablet (4 mg total) by mouth every 8 (eight) hours as needed for nausea or vomiting. (Patient not taking: Reported on 05/10/2019) 20 tablet 12  . promethazine (PHENERGAN) 25 MG suppository Place 1 suppository (25 mg total) rectally every 6 (six) hours as needed for nausea. (Patient not taking: Reported on 05/10/2019) 12 each 0   No current facility-administered medications for this visit.    Allergies  Allergen Reactions  . Buprenorphine Nausea Only     Discussed warning signs or symptoms. Please see discharge instructions. Patient expresses understanding.

## 2019-05-10 NOTE — Patient Instructions (Addendum)
Thank you for coming in today. OK to take flexeril. Be careful when taking it with xanax or with balocfen.  Adjust dose and timing.  Do not drive with these medicines.   Work on hip flexer stretches.  I think you have an issue with the iliopsoas muscle deep in the pelvis.   Do the stretch standing hip flexer. Remember to bend both knees a little. Point the toe and stand up straight and push your pelvis forward.   Do the leash hip flexer stretch.   Do exercise straight leg raises. 30 reps 2-3x daily.   Keep me updated.   I will be moving to full time Sports Medicine in [place] starting on November 1st.  You will still be able to see me for your Sports Medicine or Orthopedic needs in the [facility] Location. I will still be part of [place].    [address] [place], [place] [address]  [phone]  Telephone (phone line will be functional starting in November).  [phone] Fax [phone] Concussion Line  If you want to stay locally for your Sports Medicine issues Dr. Dianah Field here in [place] will be happy to see you.  Additionally Dr. Clearance Coots at [facility] will be happy to see you for sports medicine issues more locally.   For your primary care needs you are welcome to establish care with Dr. Emeterio Reeve.  We are working quickly to hire more physicians to cover the primary care needs however if you cannot get an appointment with Dr. Sheppard Coil in a timely manner [place] has locations and openings for primary care services nearby.   [facility] at [facility] [address] . [employer] , [place]: [phone] . Behavioral Medicine: [phone] . Fax: [place] at [employer] [address] . [place], [place]: [phone] . Behavioral Medicine: [phone] . Fax: [phone] . Hours (M-F): 7am -  Academic librarian At [facility]. [place], [place]: [phone] . Behavioral Medicine: [phone] . Fax: [phone] . Hours (M-F): 8am - Optician, dispensing at [employer] . [place], [place] Phone: [phone] . Behavioral Medicine: [phone] . Fax: [phone]

## 2019-05-16 ENCOUNTER — Other Ambulatory Visit: Payer: Self-pay | Admitting: Family Medicine

## 2019-06-04 ENCOUNTER — Encounter: Payer: Self-pay | Admitting: Family Medicine

## 2019-06-05 ENCOUNTER — Encounter: Payer: Self-pay | Admitting: Family Medicine

## 2019-06-05 ENCOUNTER — Other Ambulatory Visit: Payer: Self-pay

## 2019-06-05 ENCOUNTER — Ambulatory Visit (INDEPENDENT_AMBULATORY_CARE_PROVIDER_SITE_OTHER): Payer: Self-pay | Admitting: Family Medicine

## 2019-06-05 VITALS — BP 104/71 | HR 99 | Temp 98.2°F | Wt 146.0 lb

## 2019-06-05 DIAGNOSIS — F331 Major depressive disorder, recurrent, moderate: Secondary | ICD-10-CM

## 2019-06-05 DIAGNOSIS — G894 Chronic pain syndrome: Secondary | ICD-10-CM

## 2019-06-05 DIAGNOSIS — F5101 Primary insomnia: Secondary | ICD-10-CM

## 2019-06-05 DIAGNOSIS — F1120 Opioid dependence, uncomplicated: Secondary | ICD-10-CM

## 2019-06-05 DIAGNOSIS — G4700 Insomnia, unspecified: Secondary | ICD-10-CM | POA: Insufficient documentation

## 2019-06-05 MED ORDER — METHOCARBAMOL 750 MG PO TABS: 750.0000 mg | ORAL_TABLET | Freq: Three times a day (TID) | ORAL | 1 refills | Status: DC

## 2019-06-05 MED ORDER — NORETHINDRONE ACETATE 5 MG PO TABS: 5.0000 mg | ORAL_TABLET | Freq: Every day | ORAL | 12 refills | Status: DC

## 2019-06-05 MED ORDER — PREGABALIN 75 MG PO CAPS: 75.0000 mg | ORAL_CAPSULE | Freq: Two times a day (BID) | ORAL | 3 refills | Status: DC

## 2019-06-05 MED ORDER — DICYCLOMINE HCL 10 MG PO CAPS: ORAL_CAPSULE | ORAL | 0 refills | Status: DC

## 2019-06-05 MED ORDER — DIAZEPAM 5 MG PO TABS: ORAL_TABLET | ORAL | 0 refills | Status: DC

## 2019-06-05 MED ORDER — BUPROPION HCL ER (XL) 300 MG PO TB24: 300.0000 mg | ORAL_TABLET | ORAL | 3 refills | Status: DC

## 2019-06-05 MED ORDER — QUETIAPINE FUMARATE 100 MG PO TABS: 100.0000 mg | ORAL_TABLET | Freq: Every day | ORAL | 3 refills | Status: DC

## 2019-06-05 NOTE — Patient Instructions (Addendum)
Thank you for coming in today. I think this is likely the same pelvic pain Causes could be either muscle pain in the pelvis or from the gut.  Use the muscle relaxers.  Use either baclofen or cyclobenzaprine and metocrabinol but not all the same.   Continue pelvic PT.  Use the valium prior to pelvic PT.  Recheck as needed.   Keep me updted.    Will try Lyrica for pain.   Increase Quitapine to 100mg  at night for sleep.

## 2019-06-05 NOTE — Progress Notes (Signed)
Amanda Vazquez is a 28 y.o. female who presents to [facility]: [place] today for pelvic pain, insomnia, history of opiate addiction.  Amanda Vazquez has a history of chronic recurrent pelvic pain.  This is thought to be due to endometriosis as well as possible IBS.  She has had multiple evaluations for this.  She is had multiple surgeries and no longer has uterus or ovaries.  She receives progesterone based hormone which does help.  She also has a history of intermittent pelvic physical therapy which she usually gets Valium intravaginally prior to physical therapy.  She was seen for pelvic pain on August 18 which was thought to be musculoskeletal related.  This got better with some home exercise program and muscle relaxers.  She notes the pain returned again on September 2.  She has been taking her various muscle relaxers including baclofen and cyclobenzaprine intermittently as well as dicyclomine for spasm.  She is denies any bowel or bladder issues and is having normal bowel movements.  She notes her pain is like in the left lower pelvis and consistent previous episodes of endometriosis.  Symptoms can be quite bad at times.  She is planning on restarting pelvic physical therapy.  She notes that she found some old leftover Percocet and took them at night along with ibuprofen which helped quite a bit.  Additionally she notes that her insomnia and mood have been pretty well controlled.  She takes Prozac and Wellbutrin during the day and Seroquel 50 mg at night.  She notes this is working quite well but she still having a lot of trouble sleeping and wonders if she can increase the dose of Seroquel.  History of opiate addiction.  Patient has had opiate addiction in the past with prescription medications.  As noted previously she is not had much manipulative behavior for this and has been pretty honest and  forthright.  Fundamentally she becomes dependent very quickly.  She had not used opiates for quite some time until recently as above.  She would like to remain off them if possible.   ROS as above:  Exam:  BP 104/71    Pulse 99    Temp 98.2 F (36.8 C) (Oral)    Wt 146 lb (66.2 kg)    LMP 12/11/2014 (Approximate)    SpO2 100%    BMI 26.70 kg/m  Wt Readings from Last 5 Encounters:  06/05/19 146 lb (66.2 kg)  05/10/19 136 lb 8 oz (61.9 kg)  03/04/19 118 lb (53.5 kg)  01/21/19 112 lb (50.8 kg)  01/18/19 115 lb (52.2 kg)    Gen: Well NAD HEENT: EOMI,  MMM Lungs: Normal work of breathing. CTABL Heart: RRR no MRG Abd: NABS, Soft. Nondistended, no masses palpated.  Tender palpation left lower pelvis with no significant rebound or guarding.  Some pain with resisted left hip flexion. Exts: Brisk capillary refill, warm and well perfused.  Psych alert and oriented normal speech thought process and affect.  Depression screen [facility] 2/9 06/05/2019 01/22/2019 01/01/2019 08/20/2018 06/05/2018  Decreased Interest 1 3 3 3 2   Down, Depressed, Hopeless 2 3 3 3 3   PHQ - 2 Score 3 6 6 6 5   Altered sleeping 2 3 3 3  0  Tired, decreased energy 2 3 3 3 2   Change in appetite 1 3 3 3 3   Feeling bad or failure about yourself  2 3 3 3 2   Trouble concentrating 2 3 3  3 3  Moving slowly or fidgety/restless 1 3 3 3 2   Suicidal thoughts 0 0 3 1 0  PHQ-9 Score 13 24 27 25 17   Difficult doing work/chores Not difficult at all Extremely dIfficult Extremely dIfficult Extremely dIfficult Very difficult  Some recent data might be hidden       Assessment and Plan: 28 y.o. female with  Pelvic pain: Likely recurrent issue of chronic pelvic pain.  Likely related to endometriosis muscle spasm and adhesions and possibly nerve entrapment.  Will avoid opiates as noted below.  Discussed options.  Plan to broaden her muscle spasm regimen.  Will try methocarbamol.  Cautioned against taking baclofen and cyclobenzaprine and  methocarbamol at the same time.  We will also try Lyrica.  Continue helpful activities.  Continue pelvic physical therapy.  Prescribed interventional Valium for use prior to PT.  Recheck back with me or new PCP near future.  Opiate addiction: History of opiate addiction.  Fundamentally patient becomes dependent very quickly.  Would avoid opiates in the future unless dire need.  We will work to have nonopiate methods to control pain.  Will use Lyrica as above.  Avoid opiates in future with new PCP.  Mood: Much better controlled anxiety and depression.  Insomnia: Increase Seroquel to 100 mg at bedtime.  Schedule follow-up with Dr. Lyn Hollingshead in about 6 weeks.  This is late October and I will still be available for consultation.  Hopefully will make the transition to new PCP inspirational as possible.  Patient was informed about my transition to sports medicine at last visit.  PDMP reviewed during this encounter. No orders of the defined types were placed in this encounter.  Meds ordered this encounter  Medications   norethindrone (AYGESTIN) 5 MG tablet    Sig: Take 1 tablet (5 mg total) by mouth daily.    Dispense:  30 tablet    Refill:  12   dicyclomine (BENTYL) 10 MG capsule    Sig: TK 1 C PO 30 MIN B MEALS AND HS FOR 5 DAYS    Dispense:  30 capsule    Refill:  0   buPROPion (WELLBUTRIN XL) 300 MG 24 hr tablet    Sig: Take 1 tablet (300 mg total) by mouth every morning.    Dispense:  90 tablet    Refill:  3   pregabalin (LYRICA) 75 MG capsule    Sig: Take 1 capsule (75 mg total) by mouth 2 (two) times daily.    Dispense:  60 capsule    Refill:  3   methocarbamol (ROBAXIN-750) 750 MG tablet    Sig: Take 1 tablet (750 mg total) by mouth 3 (three) times daily.    Dispense:  60 tablet    Refill:  1   diazepam (VALIUM) 5 MG tablet    Sig: PLACE 1 T IN VAGINA THE NIGHT PRIOR TO PT. Do not take with Xanax    Dispense:  10 tablet    Refill:  0   QUEtiapine (SEROQUEL) 100 MG  tablet    Sig: Take 1 tablet (100 mg total) by mouth at bedtime.    Dispense:  30 tablet    Refill:  3     Historical information moved to improve visibility of documentation.  Past Medical History:  Diagnosis Date   Common migraine with intractable migraine 12/16/2016   Endometriosis    Frequency of urination    Migraine    Pelvic pain in female    Polycystic ovary  disease    Past Surgical History:  Procedure Laterality Date   ABDOMINAL HYSTERECTOMY     DX LAPAROSCOPY W/ LASER ABLATION OF ENDOMETRIOSIS  06-12-2014   [facility]   LAPAROSCOPIC OVARIAN CYSTECTOMY Left 01/08/2015   Procedure: LAPAROSCOPIC OVARIAN CYSTECTOMY;  Surgeon: Richardean ChimeraJohn McComb, MD;  [facility];  Service: Gynecology;  Laterality: Left;   LAPAROSCOPY N/A 01/08/2015   Procedure: LAPAROSCOPY DIAGNOSTIC;  Surgeon: Richardean ChimeraJohn McComb, MD;  [facility] ;  Service: Gynecology;  Laterality: N/A;   TONSILLECTOMY  age 28   Social History   Tobacco Use   Smoking status: Never Smoker   Smokeless tobacco: Never Used  Substance Use Topics   Alcohol use: Yes    Comment: occasional   family history includes Alcohol abuse in her mother; Cancer in her maternal grandfather, maternal grandmother, and paternal grandfather; Diabetes in her maternal grandfather and sister; Hypertension in her maternal grandfather and paternal grandmother.  Medications: Current Outpatient Medications  Medication Sig Dispense Refill   acetaminophen (TYLENOL) 325 MG suppository Place 2 suppositories (650 mg total) rectally every 8 (eight) hours as needed for moderate pain. 30 suppository 0   ALPRAZolam (XANAX) 0.25 MG tablet Take 1 tablet (0.25 mg total) by mouth 2 (two) times daily as needed. for anxiety 60 tablet 1   baclofen (LIORESAL) 10 MG tablet TAKE 1 TABLET BY MOUTH THREE TIMES DAILY AS NEEDED FOR ABDOMINAL SPASM 30 tablet 0   buPROPion (WELLBUTRIN XL) 300 MG 24 hr  tablet Take 1 tablet (300 mg total) by mouth every morning. 90 tablet 3   cloNIDine (CATAPRES) 0.1 MG tablet TAKE 1 TABLET BY MOUTH AT BEDTIME 30 tablet 1   cyclobenzaprine (FLEXERIL) 10 MG tablet Take 1 tablet (10 mg total) by mouth 3 (three) times daily as needed for muscle spasms. 90 tablet 0   dicyclomine (BENTYL) 10 MG capsule TK 1 C PO 30 MIN B MEALS AND HS FOR 5 DAYS 30 capsule 0   ibuprofen (ADVIL,MOTRIN) 800 MG tablet Take 800 mg by mouth every 8 (eight) hours as needed for headache or moderate pain.     ondansetron (ZOFRAN) 4 MG tablet Take 1 tablet (4 mg total) by mouth every 8 (eight) hours as needed for nausea or vomiting. 20 tablet 12   PARoxetine (PAXIL) 30 MG tablet Take 1 tablet (30 mg total) by mouth daily. 90 tablet 1   promethazine (PHENERGAN) 25 MG suppository Place 1 suppository (25 mg total) rectally every 6 (six) hours as needed for nausea. 12 each 0   QUEtiapine (SEROQUEL) 100 MG tablet Take 1 tablet (100 mg total) by mouth at bedtime. 30 tablet 3   diazepam (VALIUM) 5 MG tablet PLACE 1 T IN VAGINA THE NIGHT PRIOR TO PT. Do not take with Xanax 10 tablet 0   methocarbamol (ROBAXIN-750) 750 MG tablet Take 1 tablet (750 mg total) by mouth 3 (three) times daily. 60 tablet 1   norethindrone (AYGESTIN) 5 MG tablet Take 1 tablet (5 mg total) by mouth daily. 30 tablet 12   pregabalin (LYRICA) 75 MG capsule Take 1 capsule (75 mg total) by mouth 2 (two) times daily. 60 capsule 3   No current facility-administered medications for this visit.    Allergies  Allergen Reactions   Buprenorphine Nausea Only     Discussed warning signs or symptoms. Please see discharge instructions. Patient expresses understanding.

## 2019-06-25 ENCOUNTER — Other Ambulatory Visit: Payer: Self-pay | Admitting: Family Medicine

## 2019-07-01 ENCOUNTER — Encounter: Payer: Self-pay | Admitting: Family Medicine

## 2019-07-01 ENCOUNTER — Ambulatory Visit (INDEPENDENT_AMBULATORY_CARE_PROVIDER_SITE_OTHER): Payer: Self-pay | Admitting: Family Medicine

## 2019-07-01 VITALS — BP 110/73 | HR 82 | Temp 98.3°F | Wt 145.0 lb

## 2019-07-01 DIAGNOSIS — F411 Generalized anxiety disorder: Secondary | ICD-10-CM

## 2019-07-01 DIAGNOSIS — R102 Pelvic and perineal pain: Secondary | ICD-10-CM

## 2019-07-01 DIAGNOSIS — G894 Chronic pain syndrome: Secondary | ICD-10-CM

## 2019-07-01 MED ORDER — CYCLOBENZAPRINE HCL 10 MG PO TABS: 10.0000 mg | ORAL_TABLET | Freq: Three times a day (TID) | ORAL | 3 refills | Status: DC | PRN

## 2019-07-01 MED ORDER — PREGABALIN 150 MG PO CAPS: 150.0000 mg | ORAL_CAPSULE | Freq: Two times a day (BID) | ORAL | 4 refills | Status: DC

## 2019-07-01 MED ORDER — ALPRAZOLAM 0.25 MG PO TABS: 0.2500 mg | ORAL_TABLET | Freq: Three times a day (TID) | ORAL | 4 refills | Status: DC | PRN

## 2019-07-01 NOTE — Patient Instructions (Signed)
Thank you for coming in today.  Lets try increasing the lyrica.  Continue to find the best regemin of muscle spasm medicine for your pain.  Continue to stay active and work on your home exercises.  Recheck with Dr Sheppard Coil as scheduled on Oct 28th.   If your belly pain worsens, or you have high fever, bad vomiting, blood in your stool or black tarry stool go to the Emergency Room.

## 2019-07-01 NOTE — Progress Notes (Signed)
Amanda Vazquez is a 28 y.o. female who presents to [facility]: Primary Care Sports Medicine today for follow-up abdominal pain and anxiety.   Patient has a long history of chronic pelvic pain/abdominal pain.  She has had extensive work-up evaluation and treatment including in the past hysterectomy salpingo-oophorectomy for fibroid disease.  She has had extensive pelvic therapy which has provided some benefit.  However in the past she was treated with opiates and became dependent on them and had difficulty discontinuing opiates.  I had seen her now several times for left lower quadrant abdominal pain/pelvic pain.  Evaluation so far has been unremarkable and she has been treated with muscle relaxers and dicyclomine and home exercises.  Unfortunately she cannot afford to attend pelvic physical therapy due to lack of health insurance. She notes that she was doing okay until a few days ago when the pain returned.  She has pain is worse with activity better with rest.  She denies any change to her bowel or bladder.  No fevers or chills.  Additionally she notes she is having worsening anxiety.  Her mother after a protracted illness that occurred unexpectedly.  This was about a year ago and she is coming up on the anniversary of her mother's death.  She uses Xanax 0.25 mg twice daily and notes that she is having increasing anxiety and would like to temporarily increase the Xanax if possible.     ROS as above:  Exam:  BP 110/73   Pulse 82   Temp 98.3 F (36.8 C) (Oral)   Wt 145 lb (65.8 kg)   LMP 12/11/2014 (Approximate)   BMI 26.52 kg/m  Wt Readings from Last 5 Encounters:  07/01/19 145 lb (65.8 kg)  06/05/19 146 lb (66.2 kg)  05/10/19 136 lb 8 oz (61.9 kg)  03/04/19 118 lb (53.5 kg)  01/21/19 112 lb (50.8 kg)    Gen: Well NAD HEENT: EOMI,  MMM Lungs: Normal work of breathing. CTABL Heart: RRR no  MRG Abd: NABS, Soft. Nondistended, tender palpation left lower quadrant with no rebound or guarding.  Antalgic gait present.  Decrease trunk motion. Exts: Brisk capillary refill, warm and well perfused.  Psych alert and oriented normal speech thought process and affect.    Assessment and Plan: 28 y.o. female with  Chronic left lower quadrant abdominal/pelvic pain exacerbation.  Difficult to manage with real abscess and waxing and waning course.  Doubtful for other cause as patient has had extensive work-ups in the past including repeat CT scans and lab work etc.  Plan to treat with continued intermittent muscle relaxer.  She uses several different types and is working to try to find a regimen that works for her.  We will try increasing Lyrica from 75 twice daily to 150 twice daily.  She has a follow-up scheduled with her new PCP Dr. Lyn Hollingshead on October 28.  Note to new PCP: Avoid opiates.  Patient has had extensive difficulty discontinuing opiates in the past with significant iatrogenic dependency.  Do not recommend opiates for this patient in future.  Anxiety: Worsening due to 1 year anniversary of her mother's death.  Temporary increase alprazolam.  Follow back up with PCP as scheduled on 28th.  Anticipate going back down on test in near future.  PDMP not reviewed this encounter. No orders of the defined types were placed in this encounter.  Meds ordered this encounter  Medications  . pregabalin (LYRICA) 150 MG capsule  Sig: Take 1 capsule (150 mg total) by mouth 2 (two) times daily.    Dispense:  60 capsule    Refill:  4  . cyclobenzaprine (FLEXERIL) 10 MG tablet    Sig: Take 1 tablet (10 mg total) by mouth 3 (three) times daily as needed for muscle spasms.    Dispense:  90 tablet    Refill:  3  . ALPRAZolam (XANAX) 0.25 MG tablet    Sig: Take 1 tablet (0.25 mg total) by mouth 3 (three) times daily as needed. for anxiety    Dispense:  90 tablet    Refill:  4    Ok to fill today  for upcoming out of state travel     Historical information moved to improve visibility of documentation.  Past Medical History:  Diagnosis Date  . Common migraine with intractable migraine 12/16/2016  . Endometriosis   . Frequency of urination   . Migraine   . Pelvic pain in female   . Polycystic ovary disease    Past Surgical History:  Procedure Laterality Date  . ABDOMINAL HYSTERECTOMY    . DX LAPAROSCOPY W/ LASER ABLATION OF ENDOMETRIOSIS  06-12-2014   [facility]  . LAPAROSCOPIC OVARIAN CYSTECTOMY Left 01/08/2015   Procedure: LAPAROSCOPIC OVARIAN CYSTECTOMY;  Surgeon: Richardean ChimeraJohn McComb, MD;  [facility] [place];  Service: Gynecology;  Laterality: Left;  . LAPAROSCOPY N/A 01/08/2015   Procedure: LAPAROSCOPY DIAGNOSTIC;  Surgeon: Richardean ChimeraJohn McComb, MD;  [facility] [place];  Service: Gynecology;  Laterality: N/A;  . TONSILLECTOMY  age 28   Social History   Tobacco Use  . Smoking status: Never Smoker  . Smokeless tobacco: Never Used  Substance Use Topics  . Alcohol use: Yes    Comment: occasional   family history includes Alcohol abuse in her mother; Cancer in her maternal grandfather, maternal grandmother, and paternal grandfather; Diabetes in her maternal grandfather and sister; Hypertension in her maternal grandfather and paternal grandmother.  Medications: Current Outpatient Medications  Medication Sig Dispense Refill  . acetaminophen (TYLENOL) 325 MG suppository Place 2 suppositories (650 mg total) rectally every 8 (eight) hours as needed for moderate pain. 30 suppository 0  . ALPRAZolam (XANAX) 0.25 MG tablet Take 1 tablet (0.25 mg total) by mouth 3 (three) times daily as needed. for anxiety 90 tablet 4  . baclofen (LIORESAL) 10 MG tablet TAKE 1 TABLET BY MOUTH THREE TIMES DAILY AS NEEDED FOR ABDOMINAL SPASM 30 tablet 0  . buPROPion (WELLBUTRIN XL) 300 MG 24 hr tablet Take 1 tablet (300 mg total) by mouth every morning. 90 tablet 3  .  cloNIDine (CATAPRES) 0.1 MG tablet TAKE 1 TABLET BY MOUTH AT BEDTIME 30 tablet 1  . cyclobenzaprine (FLEXERIL) 10 MG tablet Take 1 tablet (10 mg total) by mouth 3 (three) times daily as needed for muscle spasms. 90 tablet 3  . diazepam (VALIUM) 5 MG tablet PLACE 1 T IN VAGINA THE NIGHT PRIOR TO PT. Do not take with Xanax 10 tablet 0  . dicyclomine (BENTYL) 10 MG capsule TK 1 C PO 30 MIN B MEALS AND HS FOR 5 DAYS 30 capsule 0  . ibuprofen (ADVIL,MOTRIN) 800 MG tablet Take 800 mg by mouth every 8 (eight) hours as needed for headache or moderate pain.    . methocarbamol (ROBAXIN-750) 750 MG tablet Take 1 tablet (750 mg total) by mouth 3 (three) times daily. 60 tablet 1  . norethindrone (AYGESTIN) 5 MG tablet Take 1 tablet (5 mg total)  by mouth daily. 30 tablet 12  . ondansetron (ZOFRAN) 4 MG tablet Take 1 tablet (4 mg total) by mouth every 8 (eight) hours as needed for nausea or vomiting. 20 tablet 12  . PARoxetine (PAXIL) 30 MG tablet Take 1 tablet (30 mg total) by mouth daily. 90 tablet 1  . pregabalin (LYRICA) 150 MG capsule Take 1 capsule (150 mg total) by mouth 2 (two) times daily. 60 capsule 4  . promethazine (PHENERGAN) 25 MG suppository Place 1 suppository (25 mg total) rectally every 6 (six) hours as needed for nausea. 12 each 0  . QUEtiapine (SEROQUEL) 100 MG tablet Take 1 tablet (100 mg total) by mouth at bedtime. 30 tablet 3   No current facility-administered medications for this visit.    Allergies  Allergen Reactions  . Buprenorphine Nausea Only     Discussed warning signs or symptoms. Please see discharge instructions. Patient expresses understanding.

## 2019-07-10 ENCOUNTER — Other Ambulatory Visit: Payer: Self-pay | Admitting: Family Medicine

## 2019-07-24 ENCOUNTER — Ambulatory Visit: Payer: Self-pay | Admitting: Osteopathic Medicine

## 2019-08-26 ENCOUNTER — Other Ambulatory Visit: Payer: Self-pay | Admitting: Family Medicine

## 2019-10-07 ENCOUNTER — Other Ambulatory Visit: Payer: Self-pay | Admitting: Physician Assistant

## 2019-11-26 ENCOUNTER — Emergency Department (HOSPITAL_BASED_OUTPATIENT_CLINIC_OR_DEPARTMENT_OTHER): Payer: Self-pay

## 2019-11-26 ENCOUNTER — Encounter (HOSPITAL_BASED_OUTPATIENT_CLINIC_OR_DEPARTMENT_OTHER): Payer: Self-pay | Admitting: Emergency Medicine

## 2019-11-26 ENCOUNTER — Emergency Department (HOSPITAL_BASED_OUTPATIENT_CLINIC_OR_DEPARTMENT_OTHER)
Admission: EM | Admit: 2019-11-26 | Discharge: 2019-11-26 | Disposition: A | Discharge status: Home / Self Care | Payer: Self-pay | Attending: Emergency Medicine | Admitting: Emergency Medicine

## 2019-11-26 ENCOUNTER — Other Ambulatory Visit: Payer: Self-pay

## 2019-11-26 DIAGNOSIS — M25562 Pain in left knee: Secondary | ICD-10-CM | POA: Insufficient documentation

## 2019-11-26 MED ORDER — NAPROXEN 500 MG PO TABS: 500.0000 mg | ORAL_TABLET | Freq: Two times a day (BID) | ORAL | 0 refills | Status: DC

## 2019-11-26 MED ORDER — OXYCODONE HCL 5 MG PO TABS: 5.0000 mg | ORAL_TABLET | ORAL | 0 refills | Status: DC | PRN

## 2019-11-26 MED FILL — NAPROXEN 500 MG TABS: 500 | 15 days supply | Qty: 30 | Fill #0

## 2019-11-26 MED FILL — oxyCODONE HCL 5 MG TABS: 5 | 3 days supply | Qty: 5 | Fill #0

## 2019-11-26 NOTE — ED Triage Notes (Signed)
L knee pain x 3 days. Was walking down the stairs and felt a pop.

## 2019-11-26 NOTE — Discharge Instructions (Addendum)
You were evaluated in the Emergency Department and after careful evaluation, we did not find any emergent condition requiring admission or further testing in the hospital.  Your exam/testing today was overall reassuring.  Your x-ray did not show any broken bones.  Use the Naprosyn anti-inflammatory medication provided as directed and the rest of the knee.  If there is still significant pain after 2 weeks, we recommend follow-up with the orthopedic specialist.  Please return to the Emergency Department if you experience any worsening of your condition.  We encourage you to follow up with a primary care provider.  Thank you for allowing [place] to be a part of your care.

## 2019-11-26 NOTE — ED Provider Notes (Signed)
[facility] Emergency Department Provider Note MRN:  [id number]  Arrival date & time: 11/26/19     Chief Complaint   Knee Pain   History of Present Illness   Amanda Vazquez is a 29 y.o. year-old female with a history of migraines presenting to the ED with chief complaint of knee pain.  Location: Left knee Duration: 2 days Onset: Sudden, stumbled going down the stairs and struck the left knee against the wall Timing: Constant pain Description: Sharp Severity: Moderate to severe Exacerbating/Alleviating Factors: Worse with range of motion or ambulation Associated Symptoms: None Pertinent Negatives: Denies fever, no other traumatic injuries.   Review of Systems  A complete 10 system review of systems was obtained and all systems are negative except as noted in the HPI and PMH.   Patient's Health History    Past Medical History:  Diagnosis Date  . Common migraine with intractable migraine 12/16/2016  . Endometriosis   . Frequency of urination   . Migraine   . Pelvic pain in female   . Polycystic ovary disease     Past Surgical History:  Procedure Laterality Date  . ABDOMINAL HYSTERECTOMY    . DX LAPAROSCOPY W/ LASER ABLATION OF ENDOMETRIOSIS  06-12-2014   [facility]  . LAPAROSCOPIC OVARIAN CYSTECTOMY Left 01/08/2015   Procedure: LAPAROSCOPIC OVARIAN CYSTECTOMY;  Surgeon: Arvella Nigh, MD;  Location: [place];  Service: Gynecology;  Laterality: Left;  . LAPAROSCOPY N/A 01/08/2015   Procedure: LAPAROSCOPY DIAGNOSTIC;  Surgeon: Arvella Nigh, MD;  Location: [facility];  Service: Gynecology;  Laterality: N/A;  . TONSILLECTOMY  age 53    Family History  Problem Relation Age of Onset  . Diabetes Sister   . Diabetes Maternal Grandfather   . Hypertension Maternal Grandfather   . Cancer Maternal Grandfather   . Hypertension Paternal Grandmother   . Alcohol abuse Mother   . Cancer Maternal Grandmother    . Cancer Paternal Grandfather   . Heart attack Neg Hx     Social History   Socioeconomic History  . Marital status: Single    Spouse name: Not on file  . Number of children: 0  . Years of education: 66  . Highest education level: Not on file  Occupational History  . Not on file  Tobacco Use  . Smoking status: Never Smoker  . Smokeless tobacco: Never Used  Substance and Sexual Activity  . Alcohol use: Yes    Comment: occasional  . Drug use: No  . Sexual activity: Yes    Birth control/protection: Surgical  Other Topics Concern  . Not on file  Social History Narrative   Lives w/ mother    Caffeine use: Coffee once in awhile   Right-handed   Social Determinants of Health   Financial Resource Strain:   . Difficulty of Paying Living Expenses: Not on file  Food Insecurity:   . Worried About Charity fundraiser in the Last Year: Not on file  . Ran Out of Food in the Last Year: Not on file  Transportation Needs:   . Lack of Transportation (Medical): Not on file  . Lack of Transportation (Non-Medical): Not on file  Physical Activity:   . Days of Exercise per Week: Not on file  . Minutes of Exercise per Session: Not on file  Stress:   . Feeling of Stress : Not on file  Social Connections:   . Frequency of Communication with Friends and Family:  Not on file  . Frequency of Social Gatherings with Friends and Family: Not on file  . Attends Religious Services: Not on file  . Active Member of Clubs or Organizations: Not on file  . Attends Banker Meetings: Not on file  . Marital Status: Not on file  Intimate Partner Violence:   . Fear of Current or Ex-Partner: Not on file  . Emotionally Abused: Not on file  . Physically Abused: Not on file  . Sexually Abused: Not on file     Physical Exam   Vitals:   11/26/19 1223 11/26/19 1224  BP: 132/89   Pulse: 82 97  Resp: 16 18  Temp: 98.2 F (36.8 C) 98.2 F (36.8 C)  SpO2: 97% 99%    CONSTITUTIONAL:  Well-appearing, NAD NEURO:  Alert and oriented x 3, no focal deficits EYES:  eyes equal and reactive ENT/NECK:  no LAD, no JVD CARDIO: Regular rate, well-perfused, normal S1 and S2 PULM:  CTAB no wheezing or rhonchi GI/GU:  normal bowel sounds, non-distended, non-tender MSK/SPINE:  No gross deformities, no edema; tenderness to palpation to the right knee, no significant laxity, neurovascularly intact distally SKIN:  no rash, atraumatic PSYCH:  Appropriate speech and behavior  *Additional and/or pertinent findings included in MDM below  Diagnostic and Interventional Summary    EKG Interpretation  Date/Time:    Ventricular Rate:    PR Interval:    QRS Duration:   QT Interval:    QTC Calculation:   R Axis:     Text Interpretation:        Cardiac Monitoring Interpretation:  Labs Reviewed - No data to display  DG Knee Complete 4 Views Left  Final Result      Medications - No data to display   Procedures  /  Critical Care Procedures  ED Course and Medical Decision Making  I have reviewed the triage vital signs, the nursing notes, and pertinent available records from the EMR.  Pertinent labs & imaging results that were available during my care of the patient were reviewed by me and considered in my medical decision making (see below for details).     X-ray to exclude fracture, nothing to suggest vascular or neurological compromise, ligamentous injury is possible but no significant laxity on my exam today though it was limited due to pain and patient's persistence.  With negative x-ray, will advise NSAIDs and rest and follow-up with orthopedist if not improved in 2 weeks.  1:12 PM update: X-ray negative, appropriate for discharge.  Elmer Sow. Pilar Plate, MD [facility] Emergency Medicine [facility] [email or web address]  Final Clinical Impressions(s) / ED Diagnoses     ICD-10-CM   1. Acute pain of left knee  M25.562     ED Discharge Orders         Ordered     naproxen (NAPROSYN) 500 MG tablet  2 times daily     11/26/19 1308           Discharge Instructions Discussed with and Provided to Patient:     Discharge Instructions     You were evaluated in the Emergency Department and after careful evaluation, we did not find any emergent condition requiring admission or further testing in the hospital.  Your exam/testing today was overall reassuring.  Your x-ray did not show any broken bones.  Use the Naprosyn anti-inflammatory medication provided as directed and the rest of the knee.  If there is still significant pain after 2 weeks,  we recommend follow-up with the orthopedic specialist.  Please return to the Emergency Department if you experience any worsening of your condition.  We encourage you to follow up with a primary care provider.  Thank you for allowing [place] to be a part of your care.        Sabas Sous, MD 11/26/19 [phone]

## 2019-11-26 NOTE — ED Notes (Signed)
ED Provider at bedside. 

## 2019-11-28 ENCOUNTER — Other Ambulatory Visit: Payer: Self-pay | Admitting: Family Medicine

## 2019-11-28 NOTE — Telephone Encounter (Signed)
Please call patient and get her scheduled with a new PCP.  She was supposed to follow-up with Dr. Lyn Hollingshead about a month after Dr. Denyse Amass left as some of her medications have been adjusted.  I did refill her alprazolam for 2 weeks but she needs to get in with a new PCP here in our office.

## 2019-11-29 ENCOUNTER — Other Ambulatory Visit: Payer: Self-pay | Admitting: Family Medicine

## 2019-12-02 NOTE — Telephone Encounter (Signed)
Left message advising of the need for her to establish with another provider.

## 2020-01-06 ENCOUNTER — Ambulatory Visit (INDEPENDENT_AMBULATORY_CARE_PROVIDER_SITE_OTHER): Payer: Self-pay | Admitting: Nurse Practitioner

## 2020-01-06 ENCOUNTER — Other Ambulatory Visit: Payer: Self-pay

## 2020-01-06 ENCOUNTER — Encounter: Payer: Self-pay | Admitting: Nurse Practitioner

## 2020-01-06 ENCOUNTER — Other Ambulatory Visit: Payer: Self-pay | Admitting: Family Medicine

## 2020-01-06 VITALS — BP 133/88 | HR 94 | Temp 98.2°F | Resp 14 | Ht 63.0 in | Wt 140.3 lb

## 2020-01-06 DIAGNOSIS — R1032 Left lower quadrant pain: Secondary | ICD-10-CM

## 2020-01-06 DIAGNOSIS — R319 Hematuria, unspecified: Secondary | ICD-10-CM

## 2020-01-06 LAB — POCT URINALYSIS DIP (CLINITEK)
Glucose, UA: NEGATIVE mg/dL
Leukocytes, UA: NEGATIVE
Nitrite, UA: NEGATIVE
POC PROTEIN,UA: 30 — AB
Spec Grav, UA: >=1.03 — AB
Urobilinogen, UA: 0.2 U/dL
pH, UA: 6

## 2020-01-06 MED ORDER — OXYCODONE HCL 5 MG PO TABS: 5.0000 mg | ORAL_TABLET | Freq: Four times a day (QID) | ORAL | 0 refills | Status: AC | PRN

## 2020-01-06 NOTE — Patient Instructions (Signed)
Use heating pad to the abdomen. Take the oxycodone VERY SPARINGLY and only with severe pain. Keep using ibuprofen and tylenol for the pain.   If your symptoms worsen we will consider looking into an ultrasound to make sure that your endometriosis is not worsening.   I will let you know if the urine culture indicates a need for antibiotic.

## 2020-01-06 NOTE — Progress Notes (Signed)
Acute Office Visit  Subjective:    Patient ID: Amanda Vazquez, female    DOB: [birth year], 29 y.o.   MRN: [id number]  Chief Complaint  Patient presents with  . Abdominal Pain    HPI Patient is in today for left lower abdominal pain that started this past Saturday. She has a history of endometriosis and PCOS which have caused her significant abdominal pain in the past. She did have a hysterectomy with oophorectomy, which helped her symptoms significantly, but she does report that she still has flairs which cause increased pain, nausea, and vomiting. She reports she was told they were unable to get all of the endometriosis and to expect that she will have flairs from time to time.    She reports the pain is constant without waxing and waning and feels like pins and needles pain with cramping. Her pain is 8/10 and not eased with 800mg  ibuprofen or 1000mg  tylenol. She has also tried Lyrica and flexeril, which usually help with severe symptoms, but have not been effective for this flair. She reports it has been some time since her last flair of this level. She has also been experiencing nausea and vomiting starting last night and intermittent diarrhea.   She denies fever, low back pain, burning with urination, increased frequency, urinary urgency, or incontinence.   Past Medical History:  Diagnosis Date  . Common migraine with intractable migraine 12/16/2016  . Endometriosis   . Frequency of urination   . Migraine   . Pelvic pain in female   . Polycystic ovary disease     Past Surgical History:  Procedure Laterality Date  . ABDOMINAL HYSTERECTOMY    . DX LAPAROSCOPY W/ LASER ABLATION OF ENDOMETRIOSIS  06-12-2014   [facility]  . LAPAROSCOPIC OVARIAN CYSTECTOMY Left 01/08/2015   Procedure: LAPAROSCOPIC OVARIAN CYSTECTOMY;  Surgeon: 06-14-2014, MD;  Location: [facility];  Service: Gynecology;  Laterality: Left;  . LAPAROSCOPY N/A 01/08/2015   Procedure:  LAPAROSCOPY DIAGNOSTIC;  Surgeon: [facility], MD;  Location: [facility];  Service: Gynecology;  Laterality: N/A;  . TONSILLECTOMY  age 72    Family History  Problem Relation Age of Onset  . Diabetes Sister   . Diabetes Maternal Grandfather   . Hypertension Maternal Grandfather   . Cancer Maternal Grandfather   . Hypertension Paternal Grandmother   . Alcohol abuse Mother   . Cancer Maternal Grandmother   . Cancer Paternal Grandfather   . Heart attack Neg Hx     Social History   Socioeconomic History  . Marital status: Single    Spouse name: Not on file  . Number of children: 0  . Years of education: 8  . Highest education level: Not on file  Occupational History  . Not on file  Tobacco Use  . Smoking status: Never Smoker  . Smokeless tobacco: Never Used  Substance and Sexual Activity  . Alcohol use: Yes    Comment: occasional  . Drug use: No  . Sexual activity: Yes    Birth control/protection: Surgical  Other Topics Concern  . Not on file  Social History Narrative   Lives w/ mother    Caffeine use: Coffee once in awhile   Right-handed   Social Determinants of Health   Financial Resource Strain:   . Difficulty of Paying Living Expenses:   Food Insecurity:   . Worried About 14 in the Last Year:   . 14 of Programme researcher, broadcasting/film/video  in the Last Year:   Transportation Needs:   . Film/video editor (Medical):   Marland Kitchen Lack of Transportation (Non-Medical):   Physical Activity:   . Days of Exercise per Week:   . Minutes of Exercise per Session:   Stress:   . Feeling of Stress :   Social Connections:   . Frequency of Communication with Friends and Family:   . Frequency of Social Gatherings with Friends and Family:   . Attends Religious Services:   . Active Member of Clubs or Organizations:   . Attends Archivist Meetings:   Marland Kitchen Marital Status:   Intimate Partner Violence:   . Fear of Current or Ex-Partner:   . Emotionally Abused:   Marland Kitchen  Physically Abused:   . Sexually Abused:     Outpatient Medications Prior to Visit  Medication Sig Dispense Refill  . ALPRAZolam (XANAX) 0.25 MG tablet TAKE ONE TABLET BY MOUTH THREE TIMES A DAY AS NEEDED FOR ANXIETY 45 tablet 0  . cyclobenzaprine (FLEXERIL) 10 MG tablet TAKE 1 TABLET BY MOUTH THREE TIMES DAILY AS NEEDED FOR MUSCLE SPASMS 90 tablet 0  . ibuprofen (ADVIL,MOTRIN) 800 MG tablet Take 800 mg by mouth every 8 (eight) hours as needed for headache or moderate pain.    . naproxen (NAPROSYN) 500 MG tablet Take 1 tablet (500 mg total) by mouth 2 (two) times daily. 30 tablet 0  . promethazine (PHENERGAN) 25 MG tablet TAKE 1 TABLET BY MOUTH EVERY 8 HOURS AS NEEDED FOR NAUSEA AND VOMITING 30 tablet 5  . oxyCODONE (ROXICODONE) 5 MG immediate release tablet Take 1 tablet (5 mg total) by mouth every 4 (four) hours as needed for severe pain. 5 tablet 0   No facility-administered medications prior to visit.    Allergies  Allergen Reactions  . Buprenorphine Nausea Only    Review of Systems  Constitutional: Positive for activity change and fatigue. Negative for chills, diaphoresis and fever.  Respiratory: Negative for chest tightness and shortness of breath.   Cardiovascular: Negative for chest pain.  Gastrointestinal: Positive for abdominal pain, diarrhea, nausea and vomiting. Negative for abdominal distention, anal bleeding, blood in stool, constipation and rectal pain.  Genitourinary: Positive for pelvic pain. Negative for decreased urine volume, difficulty urinating, dysuria, flank pain, frequency, hematuria, menstrual problem, urgency, vaginal bleeding and vaginal discharge.  Musculoskeletal: Negative for back pain and myalgias.  Skin: Negative for rash.  Neurological: Negative for dizziness and light-headedness.  Psychiatric/Behavioral: Positive for sleep disturbance.       Objective:    Physical Exam Vitals and nursing note reviewed.  Constitutional:      Appearance: She is  well-developed.  HENT:     Head: Normocephalic.  Cardiovascular:     Rate and Rhythm: Normal rate and regular rhythm.     Heart sounds: Normal heart sounds.  Pulmonary:     Effort: Pulmonary effort is normal.     Breath sounds: Normal breath sounds.  Abdominal:     General: Abdomen is flat. Bowel sounds are normal. There is no distension or abdominal bruit. There are no signs of injury.     Palpations: Abdomen is soft. There is no shifting dullness, fluid wave, hepatomegaly, splenomegaly, mass or pulsatile mass.     Tenderness: There is abdominal tenderness in the right lower quadrant, suprapubic area and left lower quadrant. There is no right CVA tenderness, left CVA tenderness, guarding or rebound.     Hernia: No hernia is present.  Genitourinary:  Adnexa: Right adnexa normal and left adnexa normal.       Right: No mass, tenderness or fullness.         Left: No mass, tenderness or fullness.    Skin:    General: Skin is warm and dry.     Capillary Refill: Capillary refill takes less than 2 seconds.  Neurological:     General: No focal deficit present.     Mental Status: She is alert and oriented to person, place, and time.  Psychiatric:        Mood and Affect: Mood normal.        Behavior: Behavior normal.     BP 133/88 (BP Location: Left Arm, Patient Position: Sitting, Cuff Size: Normal)   Pulse 94   Temp 98.2 F (36.8 C)   Resp 14   Ht 5\' 3"  (1.6 m)   Wt 140 lb 4.8 oz (63.6 kg)   LMP 12/11/2014 (Approximate)   SpO2 98%   BMI 24.85 kg/m  Wt Readings from Last 3 Encounters:  01/06/20 140 lb 4.8 oz (63.6 kg)  11/26/19 135 lb (61.2 kg)  07/01/19 145 lb (65.8 kg)    There are no preventive care reminders to display for this patient.  There are no preventive care reminders to display for this patient.   No results found for: TSH Lab Results  Component Value Date   WBC 7.2 01/08/2019   HGB 13.0 01/08/2019   HCT 38.2 01/08/2019   MCV 87.8 01/08/2019   PLT 357  01/08/2019   Lab Results  Component Value Date   NA 141 01/08/2019   K 3.3 (L) 01/08/2019   CO2 23 01/08/2019   GLUCOSE 93 01/08/2019   BUN 13 01/08/2019   CREATININE 0.74 01/08/2019   BILITOT 0.9 01/08/2019   ALKPHOS 53 01/08/2019   AST 18 01/08/2019   ALT 13 01/08/2019   PROT 8.0 01/08/2019   ALBUMIN 5.2 (H) 01/08/2019   CALCIUM 9.5 01/08/2019   ANIONGAP 14 01/08/2019   No results found for: CHOL No results found for: HDL No results found for: LDLCALC No results found for: TRIG No results found for: CHOLHDL No results found for: 01/10/2019     Assessment & Plan:   1. Left lower quadrant abdominal pain Symptoms and presentation consistent with pain patient has experienced in the past due to endometriosis flair. The pain is not currently controlled with OTC medications and conservative management.  The patient does not currently have health insurance, therefore, we will avoid additional testing at this time, but if symptoms persist she will need an abdominal ultrasound to determine if the endometriosis has grown. There is also a small suspicion for kidney stones, however, the description of pain is not consistent with this diagnosis.  Urinalysis did show a small amount of trace blood and ketones, with no leukocytes or nitrites present. We will send for culture, but I will avoid antibiotics unless it is indicated based on those results.  Will treat today with limited quantity of Oxycodone IR 5mg . Patient encouraged to continue ibuprofen and tylenol alternating along with heating pad to the abdomen.  Patient instructed to avoid using oxycodone with alprazolam and to use both VERY SPARINGLY. She may also use promethazine for nausea, as needed.  Patient to follow-up if symptoms worsen or fail to improve.  - POCT URINALYSIS DIP (CLINITEK) - oxyCODONE (OXY IR/ROXICODONE) 5 MG immediate release tablet; Take 1 tablet (5 mg total) by mouth every 6 (six) hours as needed  for up to 5 days for  severe pain.  Dispense: 20 tablet; Refill: 0  2. Hematuria, unspecified type Incidental finding of hematuria on urinalysis without other symptoms of urinary tract infection present. Will send the urine for culture and determine if antibiotic therapy is necessary once results are received.  Patient instructed to notify if she begins to experience UTI symptoms.  - Urine Culture    [name], NP

## 2020-01-08 ENCOUNTER — Other Ambulatory Visit: Payer: Self-pay | Admitting: Nurse Practitioner

## 2020-01-08 DIAGNOSIS — N3090 Cystitis, unspecified without hematuria: Secondary | ICD-10-CM

## 2020-01-08 LAB — URINE CULTURE
MICRO NUMBER:: 10352362
SPECIMEN QUALITY:: ADEQUATE

## 2020-01-08 MED ORDER — NITROFURANTOIN MONOHYD MACRO 100 MG PO CAPS: 100.0000 mg | ORAL_CAPSULE | Freq: Two times a day (BID) | ORAL | 0 refills | Status: DC

## 2020-01-09 ENCOUNTER — Encounter: Payer: Self-pay | Admitting: Nurse Practitioner

## 2020-01-09 ENCOUNTER — Other Ambulatory Visit: Payer: Self-pay | Admitting: Nurse Practitioner

## 2020-01-09 DIAGNOSIS — R1032 Left lower quadrant pain: Secondary | ICD-10-CM

## 2020-01-10 ENCOUNTER — Other Ambulatory Visit: Payer: Self-pay | Admitting: Nurse Practitioner

## 2020-01-10 DIAGNOSIS — R1032 Left lower quadrant pain: Secondary | ICD-10-CM

## 2020-01-10 DIAGNOSIS — F411 Generalized anxiety disorder: Secondary | ICD-10-CM

## 2020-01-10 MED ORDER — ALPRAZOLAM 0.25 MG PO TABS: 0.2500 mg | ORAL_TABLET | Freq: Two times a day (BID) | ORAL | 0 refills | Status: DC | PRN

## 2020-01-10 MED ORDER — CYCLOBENZAPRINE HCL 10 MG PO TABS: 10.0000 mg | ORAL_TABLET | Freq: Three times a day (TID) | ORAL | 0 refills | Status: DC | PRN

## 2020-02-07 ENCOUNTER — Encounter: Payer: Self-pay | Admitting: Nurse Practitioner

## 2020-02-07 ENCOUNTER — Other Ambulatory Visit: Payer: Self-pay

## 2020-02-07 ENCOUNTER — Ambulatory Visit (INDEPENDENT_AMBULATORY_CARE_PROVIDER_SITE_OTHER): Payer: Self-pay | Admitting: Nurse Practitioner

## 2020-02-07 VITALS — BP 109/76 | HR 88 | Temp 98.0°F | Ht 63.0 in | Wt 146.6 lb

## 2020-02-07 DIAGNOSIS — R1032 Left lower quadrant pain: Secondary | ICD-10-CM

## 2020-02-07 MED ORDER — CYCLOBENZAPRINE HCL 10 MG PO TABS: 10.0000 mg | ORAL_TABLET | Freq: Three times a day (TID) | ORAL | 0 refills | Status: DC | PRN

## 2020-02-07 MED ORDER — OXYCODONE-ACETAMINOPHEN 5-325 MG PO TABS: 1.0000 | ORAL_TABLET | Freq: Four times a day (QID) | ORAL | 0 refills | Status: AC | PRN

## 2020-02-07 NOTE — Patient Instructions (Signed)
Endometriosis  Endometriosis is a condition in which the tissue that lines the uterus (endometrium) grows outside of its normal location. The tissue may grow in many locations close to the uterus, but it commonly grows on the ovaries, fallopian tubes, vagina, or bowel. When the uterus sheds the endometrium every menstrual cycle, there is bleeding wherever the endometrial tissue is located. This can cause pain because blood is irritating to tissues that are not normally exposed to it. What are the causes? The cause of endometriosis is not known. What increases the risk? You may be more likely to develop endometriosis if you:  Have a family history of endometriosis.  Have never given birth.  Started your period at age 10 or younger.  Have high levels of estrogen in your body.  Were exposed to a certain medicine (diethylstilbestrol) before you were born (in utero).  Had low birth weight.  Were born as a twin, triplet, or other multiple.  Have a BMI of less than 25. BMI is an estimate of body fat and is calculated from height and weight. What are the signs or symptoms? Often, there are no symptoms of this condition. If you do have symptoms, they may:  Vary depending on where your endometrial tissue is growing.  Occur during your menstrual period (most common) or midcycle.  Come and go, or you may go months with no symptoms at all.  Stop with menopause. Symptoms may include:  Pain in the back or abdomen.  Heavier bleeding during periods.  Pain during sex.  Painful bowel movements.  Infertility.  Pelvic pain.  Bleeding more than once a month. How is this diagnosed? This condition is diagnosed based on your symptoms and a physical exam. You may have tests, such as:  Blood tests and urine tests. These may be done to help rule out other possible causes of your symptoms.  Ultrasound, to look for abnormal tissues.  An X-ray of the lower bowel (barium enema).  An  ultrasound that is done through the vagina (transvaginally).  CT scan.  MRI.  Laparoscopy. In this procedure, a lighted, pencil-sized instrument called a laparoscope is inserted into your abdomen through an incision. The laparoscope allows your health care provider to look at the organs inside your body and check for abnormal tissue to confirm the diagnosis. If abnormal tissue is found, your health care provider may remove a small piece of tissue (biopsy) to be examined under a microscope. How is this treated? Treatment for this condition may include:  Medicines to relieve pain, such as NSAIDs.  Hormone therapy. This involves using artificial (synthetic) hormones to reduce endometrial tissue growth. Your health care provider may recommend using a hormonal form of birth control, or other medicines.  Surgery. This may be done to remove abnormal endometrial tissue. ? In some cases, tissue may be removed using a laparoscope and a laser (laparoscopic laser treatment). ? In severe cases, surgery may be done to remove the fallopian tubes, uterus, and ovaries (hysterectomy). Follow these instructions at home:  Take over-the-counter and prescription medicines only as told by your health care provider.  Do not drive or use heavy machinery while taking prescription pain medicine.  Try to avoid activities that cause pain, including sexual activity.  Keep all follow-up visits as told by your health care provider. This is important. Contact a health care provider if:  You have pain in the area between your hip bones (pelvic area) that occurs: ? Before, during, or after your period. ?   In between your period and gets worse during your period. ? During or after sex. ? With bowel movements or urination, especially during your period.  You have problems getting pregnant.  You have a fever. Get help right away if:  You have severe pain that does not get better with medicine.  You have severe  nausea and vomiting, or you cannot eat without vomiting.  You have pain that affects only the lower, right side of your abdomen.  You have abdominal pain that gets worse.  You have abdominal swelling.  You have blood in your stool. This information is not intended to replace advice given to you by your health care provider. Make sure you discuss any questions you have with your health care provider. Document Revised: 08/25/2017 Document Reviewed: 02/13/2016 Elsevier Patient Education  2020 [employer]  

## 2020-02-07 NOTE — Progress Notes (Signed)
Acute Office Visit  Subjective:    Patient ID: Amanda Vazquez, female    DOB: 1990/10/26, 29 y.o.   MRN: [id number]  No chief complaint on file.   HPI Patient is in today for left lower quadrant abdominal pain that has been ongoing for some time now. She was last seen in this office for the same problem on 01/06/2020. At that time she was unsure if her pain could be related to endometriosis flairs, which she was told would likely persist despite hysterectomy. She reports that her pain improved last time with the aid of heating pad and pain medication. She reports her symptoms started again Wednesday night of this week and were so bad last night that she was unable to sleep. She does have an appointment scheduled with her GYN on Tuesday.    Endorses sharp shooting pain at an 8/10 on the pain scale all located in the left lower quadrant. She reports the pain is increased with movement, but does not fully resolve with rest. She tried heat, bath, tylenol and ibuprofen with minimal relief of symptoms. She reports 5-6 flairs since her hysterectomy in 2018, but she feels they are occurring closer together.    Past Medical History:  Diagnosis Date  . Common migraine with intractable migraine 12/16/2016  . Endometriosis   . Frequency of urination   . Migraine   . Pelvic pain in female   . Polycystic ovary disease     Past Surgical History:  Procedure Laterality Date  . ABDOMINAL HYSTERECTOMY    . DX LAPAROSCOPY W/ LASER ABLATION OF ENDOMETRIOSIS  06-12-2014   [facility]  . LAPAROSCOPIC OVARIAN CYSTECTOMY Left 01/08/2015   Procedure: LAPAROSCOPIC OVARIAN CYSTECTOMY;  Surgeon: Richardean Chimera, MD;  Location: [facility];  Service: Gynecology;  Laterality: Left;  . LAPAROSCOPY N/A 01/08/2015   Procedure: LAPAROSCOPY DIAGNOSTIC;  Surgeon: Richardean Chimera, MD;  Location: [facility];  Service: Gynecology;  Laterality: N/A;  . TONSILLECTOMY  age 38    Family  History  Problem Relation Age of Onset  . Diabetes Sister   . Diabetes Maternal Grandfather   . Hypertension Maternal Grandfather   . Cancer Maternal Grandfather   . Hypertension Paternal Grandmother   . Alcohol abuse Mother   . Cancer Maternal Grandmother   . Cancer Paternal Grandfather   . Heart attack Neg Hx     Social History   Socioeconomic History  . Marital status: Single    Spouse name: Not on file  . Number of children: 0  . Years of education: 52  . Highest education level: Not on file  Occupational History  . Not on file  Tobacco Use  . Smoking status: Never Smoker  . Smokeless tobacco: Never Used  Substance and Sexual Activity  . Alcohol use: Yes    Comment: occasional  . Drug use: No  . Sexual activity: Yes    Birth control/protection: Surgical  Other Topics Concern  . Not on file  Social History Narrative   Lives w/ mother    Caffeine use: Coffee once in awhile   Right-handed   Social Determinants of Health   Financial Resource Strain:   . Difficulty of Paying Living Expenses:   Food Insecurity:   . Worried About Programme researcher, broadcasting/film/video in the Last Year:   . Barista in the Last Year:   Transportation Needs:   . Freight forwarder (Medical):   Marland Kitchen Lack of Transportation (  Non-Medical):   Physical Activity:   . Days of Exercise per Week:   . Minutes of Exercise per Session:   Stress:   . Feeling of Stress :   Social Connections:   . Frequency of Communication with Friends and Family:   . Frequency of Social Gatherings with Friends and Family:   . Attends Religious Services:   . Active Member of Clubs or Organizations:   . Attends Banker Meetings:   Marland Kitchen Marital Status:   Intimate Partner Violence:   . Fear of Current or Ex-Partner:   . Emotionally Abused:   Marland Kitchen Physically Abused:   . Sexually Abused:     Outpatient Medications Prior to Visit  Medication Sig Dispense Refill  . ALPRAZolam (XANAX) 0.25 MG tablet Take 1  tablet (0.25 mg total) by mouth 2 (two) times daily as needed for anxiety. Please establish care with new PCP. 45 tablet 0  . cyclobenzaprine (FLEXERIL) 10 MG tablet Take 1 tablet (10 mg total) by mouth 3 (three) times daily as needed. 90 tablet 0  . ibuprofen (ADVIL,MOTRIN) 800 MG tablet Take 800 mg by mouth every 8 (eight) hours as needed for headache or moderate pain.    . naproxen (NAPROSYN) 500 MG tablet Take 1 tablet (500 mg total) by mouth 2 (two) times daily. 30 tablet 0  . nitrofurantoin, macrocrystal-monohydrate, (MACROBID) 100 MG capsule Take 1 capsule (100 mg total) by mouth 2 (two) times daily. 14 capsule 0  . promethazine (PHENERGAN) 25 MG tablet TAKE 1 TABLET BY MOUTH EVERY 8 HOURS AS NEEDED FOR NAUSEA AND VOMITING 30 tablet 5   No facility-administered medications prior to visit.    Allergies  Allergen Reactions  . Buprenorphine Nausea Only    Review of Systems  Constitutional: Negative for appetite change, chills, fatigue and fever.  Respiratory: Negative for cough, chest tightness and shortness of breath.   Cardiovascular: Negative for chest pain, palpitations and leg swelling.  Gastrointestinal: Positive for abdominal distention, abdominal pain and nausea. Negative for blood in stool, constipation, diarrhea and vomiting.  Genitourinary: Positive for pelvic pain. Negative for difficulty urinating, dysuria, flank pain, frequency, hematuria, urgency, vaginal bleeding, vaginal discharge and vaginal pain.  Musculoskeletal: Negative for back pain.  Neurological: Negative for dizziness and weakness.  Psychiatric/Behavioral: Positive for sleep disturbance.       Objective:    Physical Exam Vitals and nursing note reviewed.  Constitutional:      Appearance: She is well-developed and normal weight.  HENT:     Head: Normocephalic.  Abdominal:     General: Abdomen is flat. Bowel sounds are normal. There is no abdominal bruit.     Palpations: Abdomen is soft. There is no  shifting dullness, hepatomegaly, splenomegaly, mass or pulsatile mass.     Tenderness: There is abdominal tenderness in the left lower quadrant. There is guarding. There is no rebound.     Hernia: No hernia is present.  Genitourinary:    Uterus: Absent.   Skin:    General: Skin is warm and dry.     Capillary Refill: Capillary refill takes less than 2 seconds.  Neurological:     General: No focal deficit present.     Mental Status: She is alert and oriented to person, place, and time.  Psychiatric:        Mood and Affect: Mood normal.        Behavior: Behavior normal.     LMP 12/11/2014 (Approximate)  Wt Readings from Last 3  Encounters:  01/06/20 140 lb 4.8 oz (63.6 kg)  11/26/19 135 lb (61.2 kg)  07/01/19 145 lb (65.8 kg)    There are no preventive care reminders to display for this patient.  There are no preventive care reminders to display for this patient.   No results found for: TSH Lab Results  Component Value Date   WBC 7.2 01/08/2019   HGB 13.0 01/08/2019   HCT 38.2 01/08/2019   MCV 87.8 01/08/2019   PLT 357 01/08/2019   Lab Results  Component Value Date   NA 141 01/08/2019   K 3.3 (L) 01/08/2019   CO2 23 01/08/2019   GLUCOSE 93 01/08/2019   BUN 13 01/08/2019   CREATININE 0.74 01/08/2019   BILITOT 0.9 01/08/2019   ALKPHOS 53 01/08/2019   AST 18 01/08/2019   ALT 13 01/08/2019   PROT 8.0 01/08/2019   ALBUMIN 5.2 (H) 01/08/2019   CALCIUM 9.5 01/08/2019   ANIONGAP 14 01/08/2019   No results found for: CHOL No results found for: HDL No results found for: LDLCALC No results found for: TRIG No results found for: CHOLHDL No results found for: HGBA1C     Assessment & Plan:   1. Left lower quadrant abdominal pain Symptoms and presentation consistent with recurrence of lower abdominal pain related to endometriosis post hysterectomy. It is possible scar tissue could be causing her pain, as well, as she did have multiple surgeries in the lower abdominal  region. She does have an appointment scheduled with her OB/GYN on Tuesday and plans to have an ultrasound performed at that visit where they can get instant results. We will defer this imaging to them at this time.  We will provide her with prescription for 5 day pain medication today in addition to flexeril, which has helped significantly with symptoms in the past. She may also use heat at any time and ibuprofen in between taking the pain medication to help with symptoms. She was instructed not take the flexeril and pain medication at the same time and to avoid driving while taking these medications.  Instructed to contact the office or go to the emergency room if symptoms worsen over the weekend or before she is able to see her OB/GYN Will follow along with GYN for management of care.  - oxyCODONE-acetaminophen (PERCOCET) 5-325 MG tablet; Take 1 tablet by mouth every 6 (six) hours as needed for up to 5 days for severe pain. Use sparingly to avoid tolerance/dependence  Dispense: 20 tablet; Refill: 0 - cyclobenzaprine (FLEXERIL) 10 MG tablet; Take 1 tablet (10 mg total) by mouth 3 (three) times daily as needed.  Dispense: 90 tablet; Refill: 0  Return if symptoms worsen or fail to improve.   Orma Render, NP

## 2020-02-25 ENCOUNTER — Encounter: Payer: Self-pay | Admitting: Nurse Practitioner

## 2020-02-25 ENCOUNTER — Other Ambulatory Visit: Payer: Self-pay | Admitting: Family Medicine

## 2020-02-25 ENCOUNTER — Telehealth (INDEPENDENT_AMBULATORY_CARE_PROVIDER_SITE_OTHER): Payer: Self-pay | Admitting: Nurse Practitioner

## 2020-02-25 VITALS — Temp 99.2°F

## 2020-02-25 DIAGNOSIS — J069 Acute upper respiratory infection, unspecified: Secondary | ICD-10-CM

## 2020-02-25 MED ORDER — PROMETHAZINE-CODEINE 6.25-10 MG/5ML PO SOLN: 5.0000 mL | Freq: Four times a day (QID) | ORAL | 0 refills | Status: DC | PRN

## 2020-02-25 NOTE — Progress Notes (Signed)
Virtual Visit via MyChart Note  I connected with  [employer] on 02/25/20 at 11:20 AM EDT by the video enabled telemedicine application, MyChart, and verified that I am speaking with the correct person using two identifiers.   I introduced myself as a Publishing rights manager with the practice. We discussed the limitations of evaluation and management by telemedicine and the availability of in person appointments. The patient expressed understanding and agreed to proceed.  The patient is: at home I am: in the office  Subjective:    CC: cold  HPI: Amanda Vazquez is a 29 y.o. y/o female presenting via MyChart today for symptoms of frequent cough, runny nose, sinus pain and pressure, sinus congestion, sore throat, painful swallowing, and nausea that started on Sunday (2 days ago). She reports significant difficulty sleeping last night due to post nasal drip and cough. She has tried Delsym and Mucinex DM with some relief. She has also been using Vicks VapoRub on her chest. She has used promethazine-codeine cough syrup in the past with similar symptoms and this helped significantly with her nausea and cough.   Past medical history, Surgical history, Family history not pertinant except as noted below, Social history, Allergies, and medications have been entered into the medical record, reviewed, and corrections made.   Review of Systems:  General: endorses low grade fever (99.1), chills, body aches Neuro: endorses headache- frontal HEENT: endoreses sinus pain/pressure,sore throat, difficulty swallowing, rhinorrhea Pulmonary/CV: endorses cough. Denies shortness of breath, chest pain, edema, palpitations, syncope GI: endorses nausea. Denies abdominal pain, vomiting, diarrhea, changes in bowel habits, anorexia   Objective:    General: Speaking clearly in complete sentences without any shortness of breath.  Alert and oriented x3.  Normal judgment. No apparent acute distress. Frequent dry cough and  congestion are present.   Impression and Recommendations:   1. Viral URI with cough Presentation and symptoms consistent with viral upper respiratory illness with cough. Given the length of time since symptom presentation, antibiotics are not warranted at this time. Information provided on symptom management and discussed with patient.   PLAN: -Prescription cough syrup for nausea and cough taken only as needed every 6 hours. Do not drive while taking this medication.  -Symptom management with OTC medications listed on AVS -Increase fluid intake - make sure you stay hydrated -Rest -If symptoms persist, worsen, or improve then return, notify me through MyChart as antibiotics may be warranted if symptoms last longer than 7 days.   - Promethazine-Codeine 6.25-10 MG/5ML SOLN; Take 5 mLs by mouth every 6 (six) hours as needed (cough and nausea).  Dispense: 180 mL; Refill: 0     I discussed the assessment and treatment plan with the patient. The patient was provided an opportunity to ask questions and all were answered. The patient agreed with the plan and demonstrated an understanding of the instructions.   The patient was advised to call back or seek an in-person evaluation if the symptoms worsen or if the condition fails to improve as anticipated.  I provided 10 minutes of non-face-to-face interaction with this MYCHART visit.   [name], NP

## 2020-02-25 NOTE — Patient Instructions (Addendum)
The following information is provided as a Counsellor for ADULT patients only and does NOT take into account PREGNANCY, ALLERGIES, LIVER CONDITIONS, KIDNEY CONDITIONS, GASTROINTESTINAL CONDITIONS, OR PRESCRIPTION MEDICATION INTERACTIONS. Please be sure to ask your provider if the following are safe to take with your specific medical history, conditions, or current medication regimen if you are unsure.   Adult Basic Symptom Management for Sinusitis/Common Cold  Congestion: Guaifenesin (Mucinex)- follow directions on packaging with a maximum dose of 2400mg  in a 24 hour period. Mucinex DM may also be used.   Pain/Fever: Ibuprofen 200mg  - 400mg  every 4-6 hours as needed (MAX 1200mg  in a 24 hour period) Pain/Fever: Tylenol 500mg  -1000mg  every 6-8 hours as needed (MAX 3000mg  in a 24 hour period)  Cough: Dextromethorphan (Delsym)- follow directions on packing with a maximum dose of 120mg  in a 24 hour period.  Nasal Stuffiness: Saline nasal spray and/or Nettie Pot with sterile saline solution  Runny Nose: Fluticasone nasal spray (Flonase) OR Mometasone nasal spray (Nasonex) OR Triamcinolone Acetonide nasal spray (Nasacort)- follow directions on the packaging  Pain/Pressure: Warm washcloth to the face  Sore Throat: Warm salt water gargles  Vicks VapoRub may help with the chest congestions.   If you have allergies, you may also consider taking an oral antihistamine (like Zyrtec or Claritin) as these may also help with your symptoms.  **Many medications will have more than one ingredient, be sure you are reading the packaging carefully and not taking more than one dose of the same kind of medication at the same time or too close together. It is OK to use formulas that have all of the ingredients you want, but do not take them in a combined medication and as separate dose too close together. If you have any questions, the pharmacist will be happy to help you decide what is safe.  This is more  than likely a viral upper respiratory and sinus infection. Follow the guidelines above to help with your symptoms. If you continue to have symptoms beyond 5-7 days or if your symptoms improve and then worsen again, please let me know through a MyChart message and we can consider antibiotics at that time.   I hope you feel better soon!

## 2020-03-01 ENCOUNTER — Encounter: Payer: Self-pay | Admitting: Nurse Practitioner

## 2020-03-02 ENCOUNTER — Encounter: Payer: Self-pay | Admitting: Nurse Practitioner

## 2020-03-02 ENCOUNTER — Telehealth (INDEPENDENT_AMBULATORY_CARE_PROVIDER_SITE_OTHER): Payer: Self-pay | Admitting: Nurse Practitioner

## 2020-03-02 VITALS — Temp 99.4°F | Wt 142.0 lb

## 2020-03-02 DIAGNOSIS — J069 Acute upper respiratory infection, unspecified: Secondary | ICD-10-CM

## 2020-03-02 MED ORDER — ALBUTEROL SULFATE HFA 108 (90 BASE) MCG/ACT IN AERS: 1.0000 | INHALATION_SPRAY | Freq: Four times a day (QID) | RESPIRATORY_TRACT | 1 refills | Status: DC | PRN

## 2020-03-02 MED ORDER — PREDNISONE 20 MG PO TABS: 20.0000 mg | ORAL_TABLET | Freq: Every day | ORAL | 0 refills | Status: DC

## 2020-03-02 MED ORDER — AMOXICILLIN-POT CLAVULANATE 875-125 MG PO TABS: 1.0000 | ORAL_TABLET | Freq: Two times a day (BID) | ORAL | 0 refills | Status: DC

## 2020-03-02 MED ORDER — PROMETHAZINE-CODEINE 6.25-10 MG/5ML PO SOLN: 5.0000 mL | Freq: Four times a day (QID) | ORAL | 0 refills | Status: DC | PRN

## 2020-03-02 NOTE — Telephone Encounter (Signed)
Pt was seen by SaraBeth on 03/02/20 for re-evaluation of symptoms.

## 2020-03-02 NOTE — Progress Notes (Signed)
Virtual Visit via MyChart Note  I connected with  [facility] on 03/02/20 at  8:50 AM EDT by the video enabled telemedicine application, MyChart, and verified that I am speaking with the correct person using two identifiers.   I introduced myself as a Designer, jewellery with the practice. We discussed the limitations of evaluation and management by telemedicine and the availability of in person appointments. The patient expressed understanding and agreed to proceed.  The patient is: at home I am: in the office  Subjective:    CC: Upper Respiratory Infection   HPI: Amanda Vazquez is a 29 y.o. y/o female presenting via [facility] today for continued symptoms of upper respiratory infection including non-productive cough, shortness of breath, chest congestion, chest heaviness, head congestion, sore throat, low grade fever, nausea, and malaise. Her symptoms first started over a week ago. She has been taking tylenol, ibuprofen, nyquil, mucinex, and prescription cough syrup with codeine and promethazine. She feels the nyquil and the prescription cough syrup are helping, but her symptoms return in 3-4 hours. She is almost out of prescription cough syrup and is asking for a refill on that today.   She denies chest pain, palpitations, dizziness, light headedness, or loss of taste or smell.   Past medical history, Surgical history, Family history not pertinant except as noted below, Social history, Allergies, and medications have been entered into the medical record, reviewed, and corrections made.   Review of Systems:  See HPI for pertinent positives and negatives.   Objective:    General: Speaking clearly in complete sentences without any shortness of breath. She is actively coughing frequently and is audibly congested.  Alert and oriented x3.  Normal judgment. No apparent acute distress.  Impression and Recommendations:    1. Upper respiratory infection, acute Symptoms and presentation  consistent with acute upper respiratory infection, most likely of bacterial etiology considering the length of time symptoms have been present. Her symptoms are unrelieved with conservative over the counter and prescription treatment, therefore we will add antibiotics today. Given the tightness in her chest due to congestions, I will also add albuterol inhaler and a prednisone burst to her treatment regimen.   PLAN: -Augmentin twice a day for 7 days.  -Prednisone 20mg  once a day for 5 days. -Albuterol inhaler as needed for cough, wheezing, shortness of breath every 4-6 hours -Promethazine-Codeine cough syrup as needed every 6 hours for cough and nausea-- use this medication very sparingly, preferably at night only.  -Mucinex and Delsym may be added during the day to help with symptoms of cough and congestion.  -You may continue Nyquil at night in between doses of the prescription cough medication to help with rebound cough.  -Continue to use Tylenol or Ibuprofen for pain and fevers.  -Cough is likely a bronchitis and may last up to 3 months. If symptoms persist, we may need to discontinue the codeine cough medication and consider a different inhaler to help with the cough. -If you begin to have shortness of breath that is not relieved with medication, dizziness, chest pain, or difficulty breathing, go to the emergency room for evaluation.    - amoxicillin-clavulanate (AUGMENTIN) 875-125 MG tablet; Take 1 tablet by mouth 2 (two) times daily.  Dispense: 14 tablet; Refill: 0 - albuterol (VENTOLIN HFA) 108 (90 Base) MCG/ACT inhaler; Inhale 1-2 puffs into the lungs every 6 (six) hours as needed for wheezing or shortness of breath.  Dispense: 8 g; Refill: 1 - Promethazine-Codeine 6.25-10 MG/5ML SOLN; Take 5  mLs by mouth every 6 (six) hours as needed (cough and nausea).  Dispense: 180 mL; Refill: 0 - predniSONE (DELTASONE) 20 MG tablet; Take 1 tablet (20 mg total) by mouth daily with breakfast for 5 days.   Dispense: 5 tablet; Refill: 0  Follow-up if symptoms worsen or fail to improve.    I discussed the assessment and treatment plan with the patient. The patient was provided an opportunity to ask questions and all were answered. The patient agreed with the plan and demonstrated an understanding of the instructions.   The patient was advised to call back or seek an in-person evaluation if the symptoms worsen or if the condition fails to improve as anticipated.  I provided 8 minutes of non-face-to-face interaction with this MYCHART visit.   [name], NP

## 2020-03-02 NOTE — Patient Instructions (Addendum)
Your cough may continue after your other symptoms have resolved. We are seeing acute bronchitis with this type of upper respiratory infection that can cause the cough to continue for up to 3 months. If this continues, we will have to stop the codeine-promethazine cough syrup and change to a different medication. I am hopeful adding the prednisone now will help decrease the inflammation in your lungs and prevent this from occurring.    PLAN: -Augmentin twice a day for 7 days.  -Prednisone 20mg  once a day for 5 days. -Albuterol inhaler as needed for cough, wheezing, shortness of breath every 4-6 hours -Promethazine-Codeine cough syrup as needed every 6 hours for cough and nausea-- use this medication very sparingly, preferably at night only.  -Mucinex and Delsym may be added during the day to help with symptoms of cough and congestion.  -You may continue Nyquil at night in between doses of the prescription cough medication to help with rebound cough.  -Continue to use Tylenol or Ibuprofen for pain and fevers.  -If you begin to have shortness of breath that is not relieved with medication, dizziness, chest pain, or difficulty breathing, go to the emergency room for evaluation.    Upper Respiratory Infection, Adult An upper respiratory infection (URI) affects the nose, throat, and upper air passages. URIs are caused by germs (viruses). The most common type of URI is often called "the common cold." Medicines cannot cure URIs, but you can do things at home to relieve your symptoms. URIs usually get better within 7-10 days. Follow these instructions at home: Activity  Rest as needed.  If you have a fever, stay home from work or school until your fever is gone, or until your doctor says you may return to work or school. ? You should stay home until you cannot spread the infection anymore (you are not contagious). ? Your doctor may have you wear a face mask so you have less risk of spreading the  infection. Relieving symptoms  Gargle with a salt-water mixture 3-4 times a day or as needed. To make a salt-water mixture, completely dissolve -1 tsp of salt in 1 cup of warm water.  Use a cool-mist humidifier to add moisture to the air. This can help you breathe more easily. Eating and drinking   Drink enough fluid to keep your pee (urine) pale yellow.  Eat soups and other clear broths. General instructions   Take over-the-counter and prescription medicines only as told by your doctor. These include cold medicines, fever reducers, and cough suppressants.  Do not use any products that contain nicotine or tobacco. These include cigarettes and e-cigarettes. If you need help quitting, ask your doctor.  Avoid being where people are smoking (avoid secondhand smoke).  Make sure you get regular shots and get the flu shot every year.  Keep all follow-up visits as told by your doctor. This is important. How to avoid spreading infection to others   Wash your hands often with soap and water. If you do not have soap and water, use hand sanitizer.  Avoid touching your mouth, face, eyes, or nose.  Cough or sneeze into a tissue or your sleeve or elbow. Do not cough or sneeze into your hand or into the air. Contact a doctor if:  You are getting worse, not better.  You have any of these: ? A fever. ? Chills. ? Brown or red mucus in your nose. ? Yellow or brown fluid (discharge)coming from your nose. ? Pain in your face,  especially when you bend forward. ? Swollen neck glands. ? Pain with swallowing. ? White areas in the back of your throat. Get help right away if:  You have shortness of breath that gets worse.  You have very bad or constant: ? Headache. ? Ear pain. ? Pain in your forehead, behind your eyes, and over your cheekbones (sinus pain). ? Chest pain.  You have long-lasting (chronic) lung disease along with any of these: ? Wheezing. ? Long-lasting cough. ? Coughing  up blood. ? A change in your usual mucus.  You have a stiff neck.  You have changes in your: ? Vision. ? Hearing. ? Thinking. ? Mood. Summary  An upper respiratory infection (URI) is caused by a germ called a virus. The most common type of URI is often called "the common cold."  URIs usually get better within 7-10 days.  Take over-the-counter and prescription medicines only as told by your doctor. This information is not intended to replace advice given to you by your health care provider. Make sure you discuss any questions you have with your health care provider. Document Revised: 09/20/2018 Document Reviewed: 05/05/2017 Elsevier Patient Education  2020 [employer].

## 2020-03-04 ENCOUNTER — Encounter: Payer: Self-pay | Admitting: Nurse Practitioner

## 2020-03-06 ENCOUNTER — Other Ambulatory Visit: Payer: Self-pay | Admitting: Nurse Practitioner

## 2020-03-06 DIAGNOSIS — J069 Acute upper respiratory infection, unspecified: Secondary | ICD-10-CM

## 2020-03-06 MED ORDER — PROMETHAZINE HCL 25 MG PO TABS: 25.0000 mg | ORAL_TABLET | Freq: Four times a day (QID) | ORAL | 1 refills | Status: DC | PRN

## 2020-03-06 MED ORDER — PREDNISONE 20 MG PO TABS: 20.0000 mg | ORAL_TABLET | Freq: Two times a day (BID) | ORAL | 0 refills | Status: AC

## 2020-03-06 MED ORDER — AZITHROMYCIN 250 MG PO TABS: ORAL_TABLET | ORAL | 0 refills | Status: DC

## 2020-03-12 ENCOUNTER — Telehealth: Payer: Self-pay | Admitting: Medical-Surgical

## 2020-03-12 ENCOUNTER — Other Ambulatory Visit: Payer: Self-pay | Admitting: Medical-Surgical

## 2020-03-12 NOTE — Telephone Encounter (Signed)
Patient believed her appointment was today, we tried to see if she could be worked in and was not able to be. Needing anxiety medication refilled (Alprazolam .25 mg) She has an appointment Monday the 21st, is there anyway the medication can be sent in to hold her over until then.

## 2020-03-12 NOTE — Telephone Encounter (Signed)
As I have never provided care for this patient and she has not officially establish with me, it is not appropriate for me to refill a controlled substance.  At her appointment on the 21st, we can evaluate her use of Xanax and the appropriateness of continued treatment.  No response necessary at this time.  This note is for charting purposes and to close the loop of communication.

## 2020-03-13 IMAGING — CT CT ABDOMEN AND PELVIS WITH CONTRAST
2 of 4 series · 16 of 46 positions shown, 18 images · IV contrast (omnipaque)
Comparison: 01/31/2016 from [REDACTED]

CLINICAL DATA: Diffuse abdominal pain for 1 week. Nausea, vomiting,
and diarrhea.

EXAM:
CT ABDOMEN AND PELVIS WITH CONTRAST
TECHNIQUE: Multidetector CT imaging of the abdomen and pelvis was performed
using the standard protocol following bolus administration of
intravenous contrast.
CONTRAST:  100mL OMNIPAQUE IOHEXOL 300 MG/ML  SOLN

[Series 2: axial st · axial · 0.79mm/px · z∈[+676,+1096]mm · 13 of 92 slices shown, 15 images]
[im 4/92  soft-tissue]
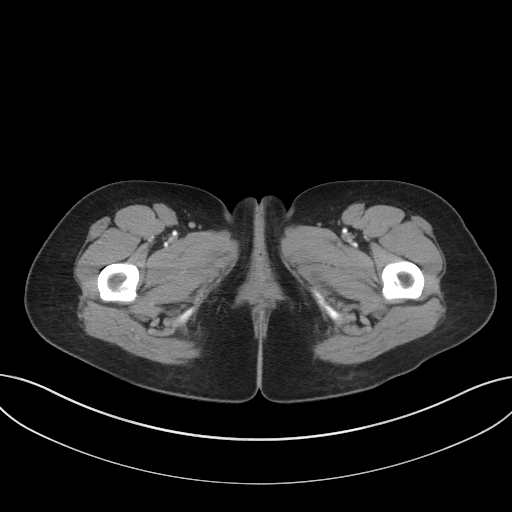
[im 4/92  bone]
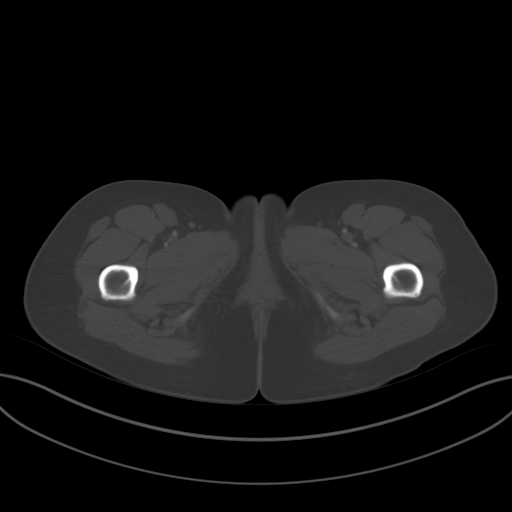
[im 12/92  soft-tissue]
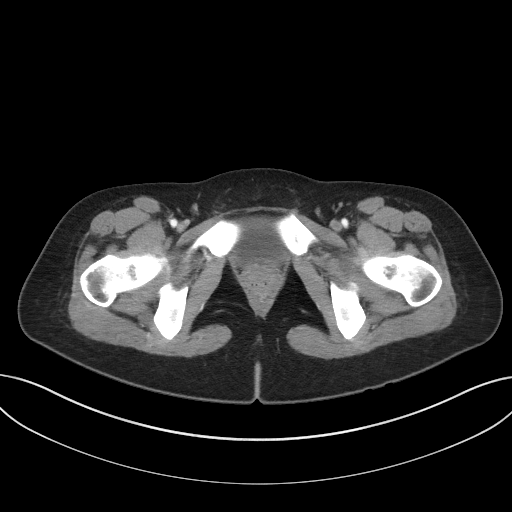
[im 19/92  soft-tissue]
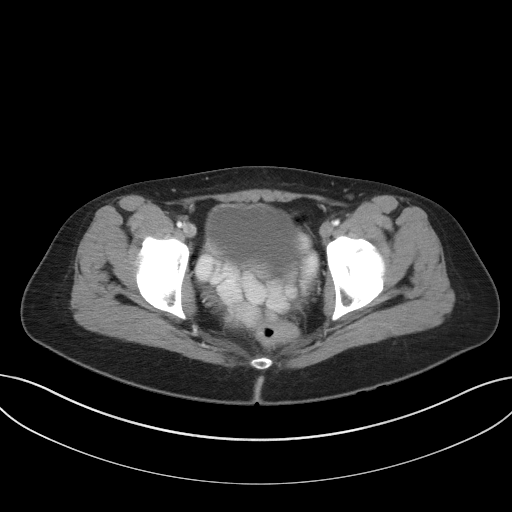
[im 27/92  soft-tissue]
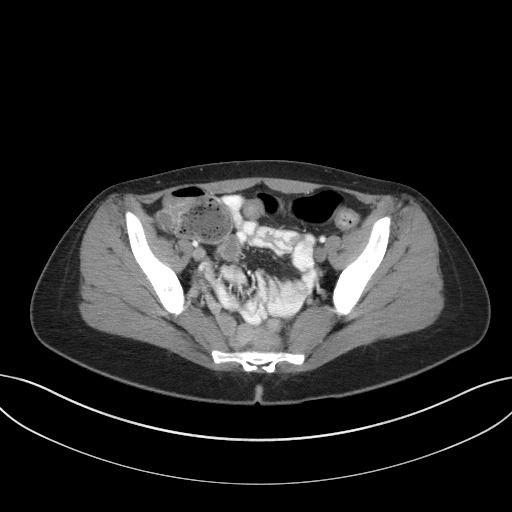
[im 31/92  soft-tissue]
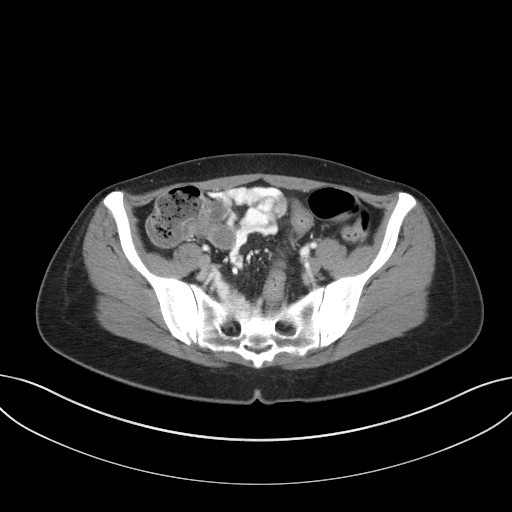
[im 38/92  soft-tissue]
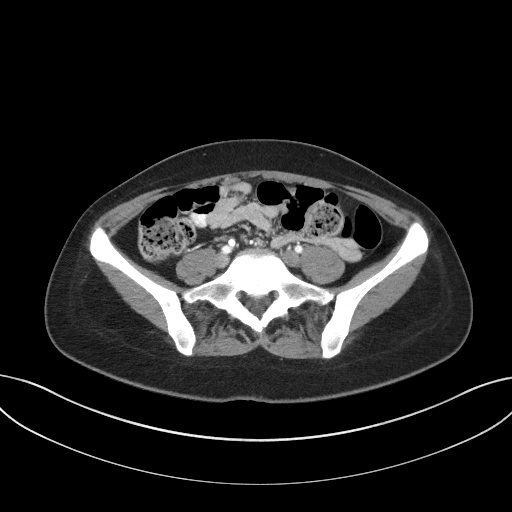
[im 46/92  soft-tissue]
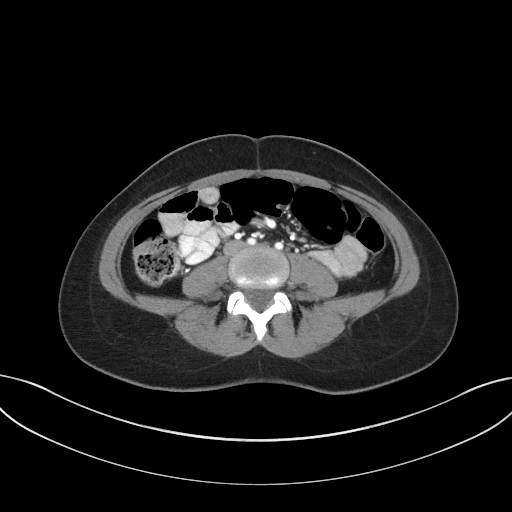
[im 54/92  soft-tissue]
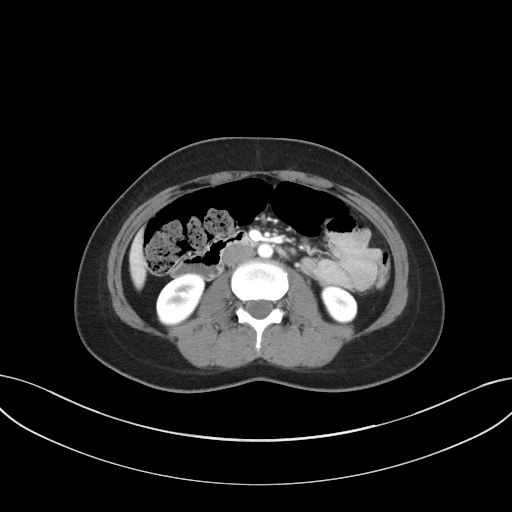
[im 61/92  soft-tissue]
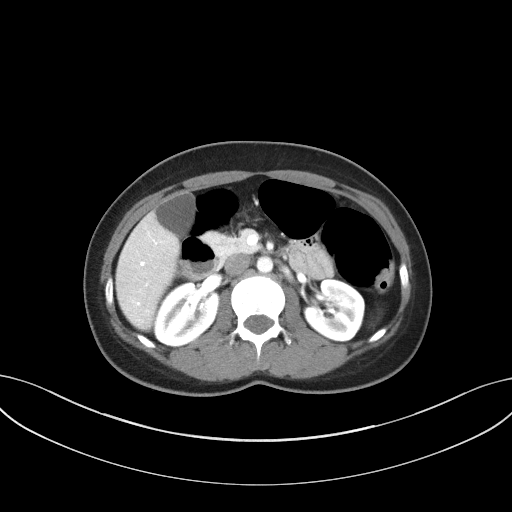
[im 61/92  bone]
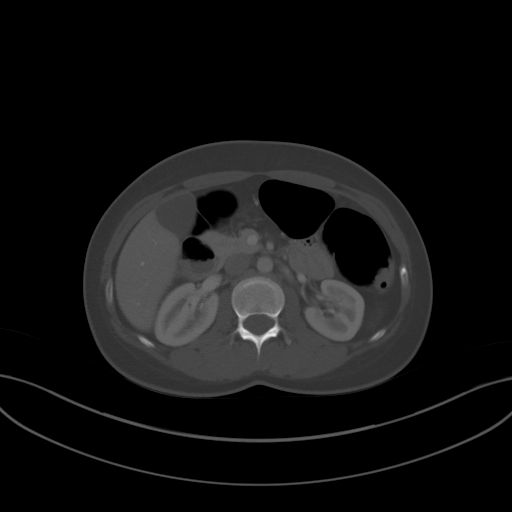
[im 65/92  soft-tissue]
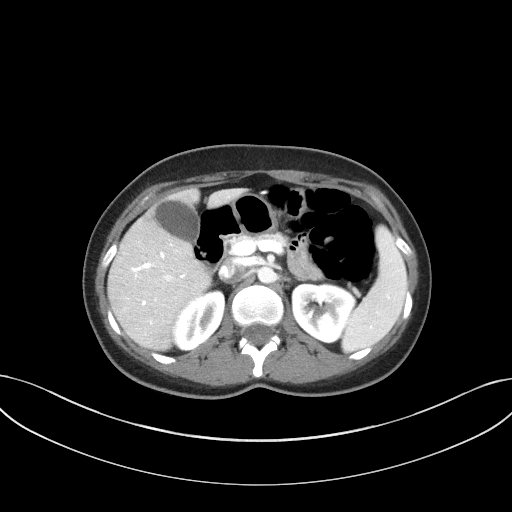
[im 73/92  soft-tissue]
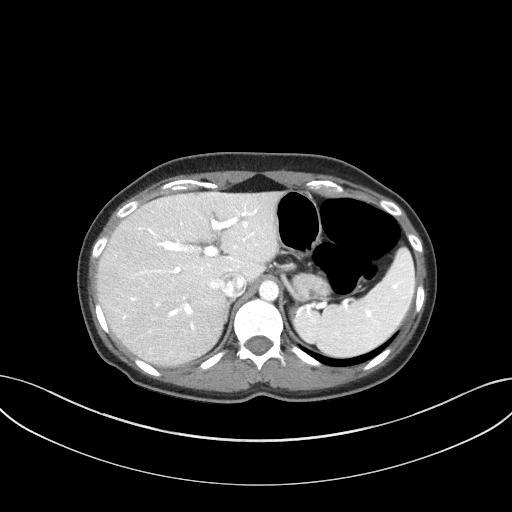
[im 80/92  soft-tissue]
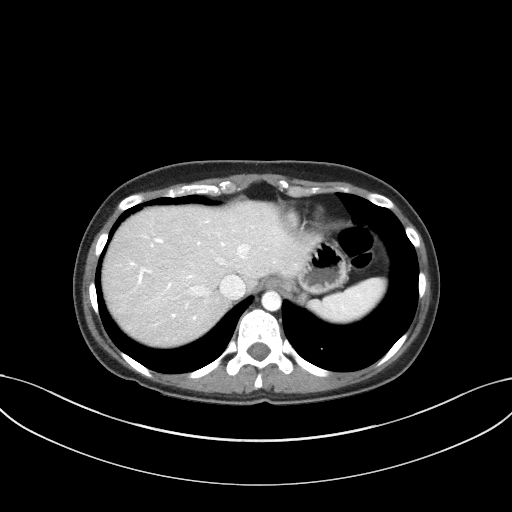
[im 88/92  soft-tissue]
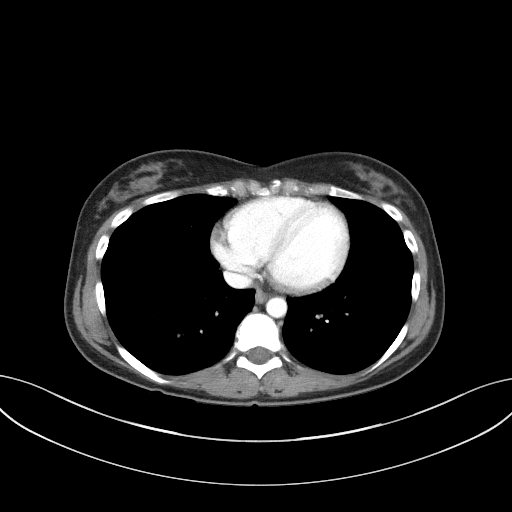

[Series 5: coronal st · coronal · 0.69mm/px · 3 of 72 slices shown]
[im 24/72  soft-tissue]
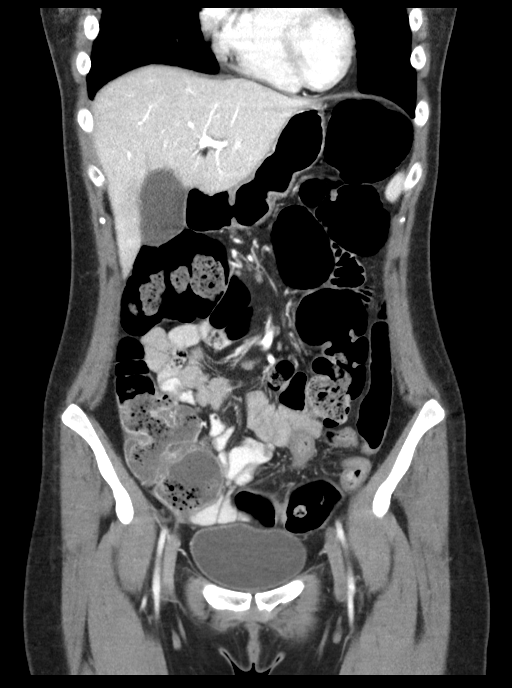
[im 32/72  soft-tissue]
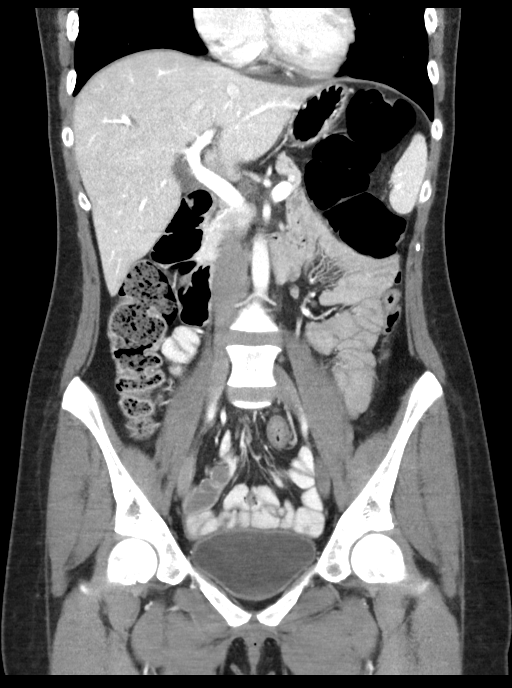
[im 40/72  soft-tissue]
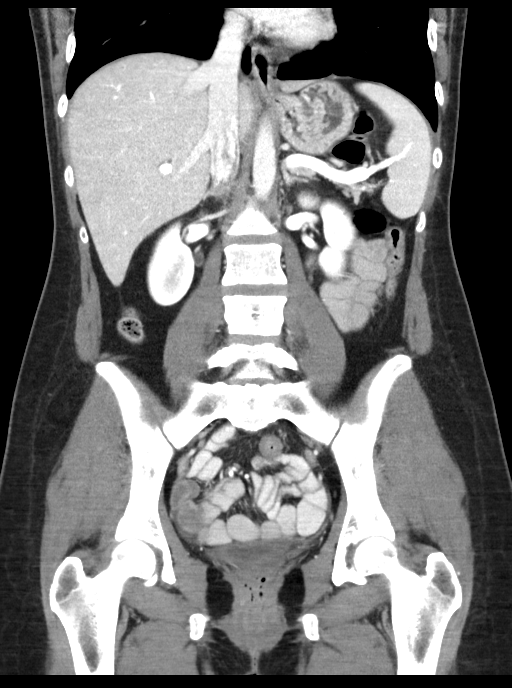

[16 of 46 positions shown; findings below may reference images not displayed]

FINDINGS: Lower Chest: No acute findings.

Hepatobiliary: No hepatic masses identified. Gallbladder is
unremarkable.

Pancreas:  No mass or inflammatory changes.

Spleen: Within normal limits in size and appearance.

Adrenals/Urinary Tract: No masses identified. No evidence of
hydronephrosis.

Stomach/Bowel: No evidence of obstruction, inflammatory process or
abnormal fluid collections.

Vascular/Lymphatic: No pathologically enlarged lymph nodes. No
abdominal aortic aneurysm.

Reproductive: Prior hysterectomy noted. Adnexal regions are
unremarkable in appearance.

Other:  None.

Musculoskeletal:  No suspicious bone lesions identified.
IMPRESSION: Negative. No acute findings or other significant abnormality.

## 2020-03-16 ENCOUNTER — Ambulatory Visit (INDEPENDENT_AMBULATORY_CARE_PROVIDER_SITE_OTHER): Payer: Self-pay | Admitting: Medical-Surgical

## 2020-03-16 ENCOUNTER — Encounter: Payer: Self-pay | Admitting: Medical-Surgical

## 2020-03-16 VITALS — BP 119/81 | HR 78 | Temp 98.4°F | Ht 64.5 in | Wt 143.2 lb

## 2020-03-16 DIAGNOSIS — F411 Generalized anxiety disorder: Secondary | ICD-10-CM

## 2020-03-16 DIAGNOSIS — R059 Cough, unspecified: Secondary | ICD-10-CM

## 2020-03-16 DIAGNOSIS — R05 Cough: Secondary | ICD-10-CM

## 2020-03-16 DIAGNOSIS — Z7689 Persons encountering health services in other specified circumstances: Secondary | ICD-10-CM

## 2020-03-16 MED ORDER — IPRATROPIUM BROMIDE 0.03 % NA SOLN: 2.0000 | Freq: Two times a day (BID) | NASAL | 0 refills | Status: DC

## 2020-03-16 MED ORDER — ALPRAZOLAM 0.25 MG PO TABS: 0.2500 mg | ORAL_TABLET | Freq: Every day | ORAL | 0 refills | Status: DC | PRN

## 2020-03-16 NOTE — Progress Notes (Signed)
Subjective:    CC: Anxiety follow-up  HPI: Pleasant 29 year old female presenting today for follow-up on anxiety.  She would also like to discuss her continued cough.  Anxiety-long history of dealing with anxiety.  She reports that this started several years ago when she lost several family members abruptly.  Since her mother passed away she has struggled with separation anxiety.  Other factors to her anxiety include chronic stomach pain from endometriosis status post hysterectomy.  She is not currently doing any counseling.  Is not currently taking any maintenance medication although she has tried many in the past.  Endorses taking Xanax 0.25 mg approximately twice weekly for the last few weeks.  Reports she feels she is getting better at coping but her depression seems to be worsening.  Denies SI/HI.  Cough-was seen by my colleague Shawna Clamp, NP for an upper respiratory infection and provided promethazine-codeine cough syrup to use at night.  Patient reports she has been using this every night but she continues to have nighttime coughing.  She is using Mucinex during the day but notes the cough is worse at night.  Reports she continues to have mild chest heaviness that made her wonder if she had pneumonia.  I reviewed the past medical history, family history, social history, surgical history, and allergies today and no changes were needed.  Please see the problem list section below in epic for further details.  Past Medical History: Past Medical History:  Diagnosis Date  . Common migraine with intractable migraine 12/16/2016  . Endometriosis   . Frequency of urination   . Migraine   . Pelvic pain in female   . Polycystic ovary disease    Past Surgical History: Past Surgical History:  Procedure Laterality Date  . ABDOMINAL HYSTERECTOMY    . DX LAPAROSCOPY W/ LASER ABLATION OF ENDOMETRIOSIS  06-12-2014   [facility]  . LAPAROSCOPIC OVARIAN CYSTECTOMY Left 01/08/2015    Procedure: LAPAROSCOPIC OVARIAN CYSTECTOMY;  Surgeon: Richardean Chimera, MD;  Location: [facility] [place];  Service: Gynecology;  Laterality: Left;  . LAPAROSCOPY N/A 01/08/2015   Procedure: LAPAROSCOPY DIAGNOSTIC;  Surgeon: Richardean Chimera, MD;  Location: [facility];  Service: Gynecology;  Laterality: N/A;  . TONSILLECTOMY  age 21   Social History: Social History   Socioeconomic History  . Marital status: Single    Spouse name: Not on file  . Number of children: 0  . Years of education: 72  . Highest education level: Not on file  Occupational History  . Not on file  Tobacco Use  . Smoking status: Never Smoker  . Smokeless tobacco: Never Used  Substance and Sexual Activity  . Alcohol use: Yes    Comment: occasional  . Drug use: No  . Sexual activity: Yes    Partners: Male    Birth control/protection: Surgical  Other Topics Concern  . Not on file  Social History Narrative   Lives w/ mother    Caffeine use: Coffee once in awhile   Right-handed   Social Determinants of Health   Financial Resource Strain:   . Difficulty of Paying Living Expenses:   Food Insecurity:   . Worried About Programme researcher, broadcasting/film/video in the Last Year:   . Barista in the Last Year:   Transportation Needs:   . Freight forwarder (Medical):   Marland Kitchen Lack of Transportation (Non-Medical):   Physical Activity:   . Days of Exercise per Week:   . Minutes  of Exercise per Session:   Stress:   . Feeling of Stress :   Social Connections:   . Frequency of Communication with Friends and Family:   . Frequency of Social Gatherings with Friends and Family:   . Attends Religious Services:   . Active Member of Clubs or Organizations:   . Attends Archivist Meetings:   Marland Kitchen Marital Status:    Family History: Family History  Problem Relation Age of Onset  . Diabetes Sister   . Diabetes Maternal Grandfather   . Hypertension Maternal Grandfather   . Cancer Maternal Grandfather   .  Hypertension Paternal Grandmother   . Alcohol abuse Mother   . Cancer Maternal Grandmother   . Cancer Paternal Grandfather   . Heart attack Neg Hx    Allergies: Allergies  Allergen Reactions  . Buprenorphine Nausea Only   Medications: See med rec.  Review of Systems: See HPI for pertinent positives and negatives.   Objective:    General: Well Developed, well nourished, and in no acute distress.  Neuro: Alert and oriented x3.  HEENT: Normocephalic, atraumatic.  Skin: Warm and dry. Cardiac: Regular rate and rhythm, no murmurs rubs or gallops, no lower extremity edema.  Respiratory: Clear to auscultation bilaterally. Not using accessory muscles, speaking in full sentences.   Impression and Recommendations:    1. Encounter to establish care Reviewed available records and discussed with patient.  She is currently self-pay.  Without insurance, we will defer screenings and wellness care due to cost.  2. Generalized anxiety disorder Has been on several different maintenance medications for anxiety depression in the past, most of them not tolerated due to stomach issues.  Cost is certainly a concern as she does not have insurance.  Discussed the importance of using Xanax only as a rescue drug for severe anxiety.  Discussed titrating Xanax use with a goal of eventually stopping the medication.  Patient verbalized understanding and is open to exploring the possibility of other maintenance medications.  I will look at her past medications and see what possibilities are left that are not too cost prohibitive.  For now we will continue Xanax but reduced dosing to daily as needed for severe anxiety.  Recommend contacting community resources to see if there is some low/no cost counseling options available. - ALPRAZolam (XANAX) 0.25 MG tablet; Take 1 tablet (0.25 mg total) by mouth daily as needed for anxiety.  Dispense: 30 tablet; Refill: 0  3. Cough Continue using Mucinex as needed.  Recommend  increasing fluid intake.  May benefit from a coolmist humidifier by the bed at night.  Suspect postnasal drip is exacerbating her nighttime cough so sending in Atrovent nasal spray to see if this may help.  Return in about 3 months (around 06/16/2020) for anxiety follow up.  ___________________________________________ Clearnce Sorrel, DNP, APRN, FNP-BC [facility] [place]

## 2020-03-18 ENCOUNTER — Other Ambulatory Visit: Payer: Self-pay | Admitting: Nurse Practitioner

## 2020-03-18 DIAGNOSIS — J069 Acute upper respiratory infection, unspecified: Secondary | ICD-10-CM

## 2020-04-07 ENCOUNTER — Encounter: Payer: Self-pay | Admitting: Medical-Surgical

## 2020-04-07 ENCOUNTER — Ambulatory Visit (INDEPENDENT_AMBULATORY_CARE_PROVIDER_SITE_OTHER): Payer: Self-pay | Admitting: Medical-Surgical

## 2020-04-07 VITALS — BP 129/86 | HR 88 | Temp 98.0°F | Wt 145.0 lb

## 2020-04-07 DIAGNOSIS — R102 Pelvic and perineal pain unspecified side: Secondary | ICD-10-CM

## 2020-04-07 DIAGNOSIS — K582 Mixed irritable bowel syndrome: Secondary | ICD-10-CM

## 2020-04-07 DIAGNOSIS — G8929 Other chronic pain: Secondary | ICD-10-CM

## 2020-04-07 DIAGNOSIS — G894 Chronic pain syndrome: Secondary | ICD-10-CM

## 2020-04-07 MED ORDER — OXYCODONE-ACETAMINOPHEN 7.5-325 MG PO TABS: 1.0000 | ORAL_TABLET | Freq: Two times a day (BID) | ORAL | 0 refills | Status: AC | PRN

## 2020-04-07 MED ORDER — OXYCODONE-ACETAMINOPHEN 7.5-325 MG PO TABS: 1.0000 | ORAL_TABLET | Freq: Two times a day (BID) | ORAL | 0 refills | Status: DC | PRN

## 2020-04-07 NOTE — Progress Notes (Signed)
Subjective:    CC: Left abdominal pain  HPI: Amanda Vazquez 29 year old female presenting today with recurrent left abdominal pain.  Long history of abdominal pain with multiple treatments attempted as well as complete hysterectomy over the past few years.  She has a planned endoscopic surgery on 8/5 with her OB/GYN to evaluate for potential endometriosis in the abdominal wall.  Unfortunately her pain is kicked up in the last couple of days and it has been really bad.  She is unable to get in with her OB/GYN office until Monday and they advised her to go to urgent care or to her primary care to see if she can get treatment there.  Her abdominal pain tends to last 4 to 5 days and is similar to a menstrual cycle.  She has been using heating pads/hot bath, sometimes using ice/cold, ibuprofen 40 mg every 4 hours, Tylenol 1000 mg every 6-8 hours.  Tuesday night she did try Flexeril which only made her sleepy and did not help with the pain.  She is currently taking estradiol for hormone replacement which is managed by OB/GYN.  She does have a history of nausea in association with this pain.  Endorses occasional vomiting.  Would like to be referred to GI for reevaluation to make sure there is nothing GI that is causing her discomfort.  History of opioid dependence.  She is aware that we prefer not to give her narcotics due to her history of addiction.  She was previously referred to pain management and reports she saw someone at the low [place] location.  Notes that the only treatment they wanted to provide for her was "cutting a nerve" and that she did not want to have this done in her OB/GYN at that time advised her not to go forward with it.  I reviewed the past medical history, family history, social history, surgical history, and allergies today and no changes were needed.  Please see the problem list section below in epic for further details.  Past Medical History: Past Medical History:  Diagnosis  Date  . Common migraine with intractable migraine 12/16/2016  . Endometriosis   . Frequency of urination   . Migraine   . Pelvic pain in female   . Polycystic ovary disease    Past Surgical History: Past Surgical History:  Procedure Laterality Date  . ABDOMINAL HYSTERECTOMY    . DX LAPAROSCOPY W/ LASER ABLATION OF ENDOMETRIOSIS  06-12-2014   [facility]  . LAPAROSCOPIC OVARIAN CYSTECTOMY Left 01/08/2015   Procedure: LAPAROSCOPIC OVARIAN CYSTECTOMY;  Surgeon: Richardean Chimera, MD;  Location: [facility];  Service: Gynecology;  Laterality: Left;  . LAPAROSCOPY N/A 01/08/2015   Procedure: LAPAROSCOPY DIAGNOSTIC;  Surgeon: Richardean Chimera, MD;  Location: [facility];  Service: Gynecology;  Laterality: N/A;  . TONSILLECTOMY  age 44   Social History: Social History   Socioeconomic History  . Marital status: Single    Spouse name: Not on file  . Number of children: 0  . Years of education: 50  . Highest education level: Not on file  Occupational History  . Not on file  Tobacco Use  . Smoking status: Never Smoker  . Smokeless tobacco: Never Used  Substance and Sexual Activity  . Alcohol use: Yes    Comment: occasional  . Drug use: No  . Sexual activity: Yes    Partners: Male    Birth control/protection: Surgical  Other Topics Concern  . Not on file  Social  History Narrative   Lives w/ mother    Caffeine use: Coffee once in awhile   Right-handed   Social Determinants of Health   Financial Resource Strain:   . Difficulty of Paying Living Expenses:   Food Insecurity:   . Worried About Programme researcher, broadcasting/film/video in the Last Year:   . Barista in the Last Year:   Transportation Needs:   . Freight forwarder (Medical):   Marland Kitchen Lack of Transportation (Non-Medical):   Physical Activity:   . Days of Exercise per Week:   . Minutes of Exercise per Session:   Stress:   . Feeling of Stress :   Social Connections:   . Frequency of Communication  with Friends and Family:   . Frequency of Social Gatherings with Friends and Family:   . Attends Religious Services:   . Active Member of Clubs or Organizations:   . Attends Banker Meetings:   Marland Kitchen Marital Status:    Family History: Family History  Problem Relation Age of Onset  . Diabetes Sister   . Diabetes Maternal Grandfather   . Hypertension Maternal Grandfather   . Cancer Maternal Grandfather   . Hypertension Paternal Grandmother   . Alcohol abuse Mother   . Cancer Maternal Grandmother   . Cancer Paternal Grandfather   . Heart attack Neg Hx    Allergies: Allergies  Allergen Reactions  . Buprenorphine Nausea Only   Medications: See med rec.  Review of Systems: No fevers, chills, night sweats, weight loss, chest pain, or shortness of breath.   Objective:    General: Well Developed, well nourished, and in no acute distress.  Neuro: Alert and oriented x3.  HEENT: Normocephalic, atraumatic.  Skin: Warm and dry. Cardiac: Regular rate and rhythm, no murmurs rubs or gallops, no lower extremity edema.  Respiratory: Clear to auscultation bilaterally. Not using accessory muscles, speaking in full sentences. Abdomen: Soft, nondistended.  Tenderness in the left lower quadrant near the inguinal crease.  Bilateral upper quadrant and right lower quadrant nontender.  Bowel sounds positive x4.  No HSM appreciated.  Impression and Recommendations:    1. Irritable bowel syndrome with both constipation and diarrhea With her history of irritable bowel syndrome and the nausea that she has been experiencing, referring to GI for further evaluation at patient request. - Ambulatory referral to Gastroenterology  2. Chronic pelvic pain in female/chronic pain syndrome Discussed current pain with patient.  With her history, cannot reconcile treating with chronic opioids for this recurrent pain.  Feel that she would benefit more from an ambulatory referral to the pain clinic to  reevaluate treatment options.  Given that she is in a holding pattern waiting for an OB/GYN appointment on Monday and other treatments are not helpful, I am giving her 10 tabs of Percocet today.  Discussed limiting the Percocet as much as possible.  Also discussed the nature of physical and psychological opioid dependence and its effect on the body. She has been informed that she will receive no further narcotics from any provider in this practice.  Patient verbalized understanding and is agreeable to the plan. - Ambulatory referral to Pain Clinic  Return if symptoms worsen or fail to improve.  Keep scheduled appointment in September. ___________________________________________ Thayer Ohm, DNP, APRN, FNP-BC Primary [facility]

## 2020-04-14 ENCOUNTER — Other Ambulatory Visit: Payer: Self-pay | Admitting: Medical-Surgical

## 2020-04-14 DIAGNOSIS — F411 Generalized anxiety disorder: Secondary | ICD-10-CM

## 2020-05-11 ENCOUNTER — Other Ambulatory Visit: Payer: Self-pay | Admitting: Medical-Surgical

## 2020-05-11 DIAGNOSIS — F411 Generalized anxiety disorder: Secondary | ICD-10-CM

## 2020-05-20 ENCOUNTER — Other Ambulatory Visit: Payer: Self-pay | Admitting: Family Medicine

## 2020-06-09 ENCOUNTER — Other Ambulatory Visit: Payer: Self-pay | Admitting: Medical-Surgical

## 2020-06-09 DIAGNOSIS — F411 Generalized anxiety disorder: Secondary | ICD-10-CM

## 2020-06-16 ENCOUNTER — Ambulatory Visit: Payer: Self-pay | Admitting: Medical-Surgical

## 2020-06-29 ENCOUNTER — Encounter: Payer: Self-pay | Admitting: Nurse Practitioner

## 2020-06-29 ENCOUNTER — Telehealth (INDEPENDENT_AMBULATORY_CARE_PROVIDER_SITE_OTHER): Payer: Self-pay | Admitting: Nurse Practitioner

## 2020-06-29 DIAGNOSIS — J029 Acute pharyngitis, unspecified: Secondary | ICD-10-CM

## 2020-06-29 DIAGNOSIS — R059 Cough, unspecified: Secondary | ICD-10-CM

## 2020-06-29 DIAGNOSIS — R112 Nausea with vomiting, unspecified: Secondary | ICD-10-CM

## 2020-06-29 DIAGNOSIS — Z20822 Contact with and (suspected) exposure to covid-19: Secondary | ICD-10-CM

## 2020-06-29 MED ORDER — PROMETHAZINE-DM 6.25-15 MG/5ML PO SYRP: 5.0000 mL | ORAL_SOLUTION | Freq: Four times a day (QID) | ORAL | 0 refills | Status: DC | PRN

## 2020-06-29 NOTE — Progress Notes (Signed)
Patient reports that that child tested negative for COVID but she started having vomiting, cough, fever, and body aches on Saturday.  Her fever reached 100.7 at its highest.  She has tried Tylenol Cold and flu and Delsym for her symptoms.

## 2020-06-29 NOTE — Progress Notes (Signed)
Virtual Video Visit via MyChart Note  I connected with  [employer] on 06/29/20 at  2:30 PM EDT by the video enabled telemedicine application for , MyChart, and verified that I am speaking with the correct person using two identifiers.   I introduced myself as a Publishing rights manager with the practice. We discussed the limitations of evaluation and management by telemedicine and the availability of in person appointments. The patient expressed understanding and agreed to proceed.  The patient is: at home I am: in the office  Subjective:    CC:  Chief Complaint  Patient presents with  . Emesis    Started Saturday  . Fever    highest 100.7  . Generalized Body Aches  . Cough    HPI: Amanda Vazquez is a 29 y.o. y/o female presenting via MyChart today for cough, generalized body aches, fever (T-max 100.7), sore throat, and vomiting that started Saturday night around midnight.  She reports that her daughter was sent home from school on Thursday with upset stomach and they were both tested on Friday for Covid which was negative.  She reports she had no symptoms at the time that she was tested.  She states that she has been taking albuterol, Tylenol Cold and flu, and Delsym for her symptoms which are moderately helpful.  She has been also taking promethazine for the vomiting and nausea which has been helpful but she reports it is difficult to swallow the pill form.  She is concerned about the possibility of strep as she has had strep many times in the past and her symptoms were similar.  Past medical history, Surgical history, Family history not pertinant except as noted below, Social history, Allergies, and medications have been entered into the medical record, reviewed, and corrections made.   Review of Systems:  See HPI for pertinent positive and negatives  Objective:    General: Speaking clearly in complete sentences without any shortness of breath.   Alert and oriented x3.    Normal judgment.  No apparent acute distress. She is audibly congested and does have a dry/hacking cough.  Impression and Recommendations:    1. Cough in adult 2. Intractable vomiting with nausea, unspecified vomiting type 3. Sore throat 4. Suspected COVID-19 virus infection Symptoms and presentation consistent with possible strep, flu, or COVID-19 virus, among other viral illnesses.  Recommend testing for COVID-19 and flu.  Given her history of positive strep we will also recommend testing for strep. Prescription for promethazine and dextromethorphan cough syrup provided today.  Discussed with the patient the need to avoid cough syrups containing codeine as these can suppress the cough and possibly lead to worsening illness or pneumonia. Recommend continue over-the-counter remedies with Advil/Tylenol, Delsym, and Mucinex.  Monitor the ingredients in any over-the-counter medications you take to avoid taking too much of the dextromethorphan. Placed on nurse schedule for tomorrow morning for testing for flu/COVID-19/strep. We will notify the patient of her results and make changes to the plan of care based on those results. Follow-up if symptoms worsen or fail to improve  I discussed the assessment and treatment plan with the patient. The patient was provided an opportunity to ask questions and all were answered. The patient agreed with the plan and demonstrated an understanding of the instructions.   The patient was advised to call back or seek an in-person evaluation if the symptoms worsen or if the condition fails to improve as anticipated.  I provided 20 minutes of non-face-to-face interaction with this  MYCHART visit including intake, same-day documentation, and chart review. --- Visit converted to telephone visit due to technical difficulties  [name], NP

## 2020-06-29 NOTE — Patient Instructions (Signed)
We have you scheduled at 1030 tomorrow morning for Covid/flu/strep testing.  Continue to use albuterol inhaler and Tylenol or ibuprofen for pain. I have called in a prescription for you to med [facility] with Phenergan and dextromethorphan combination.  Be sure to look at all of the medications you are taking and do not take more of the dextromethorphan then recommended in a day.  I recommend that you continue to quarantine until your COVID-19 results have been reviewed.

## 2020-06-30 ENCOUNTER — Other Ambulatory Visit: Payer: Self-pay

## 2020-06-30 ENCOUNTER — Ambulatory Visit (INDEPENDENT_AMBULATORY_CARE_PROVIDER_SITE_OTHER): Payer: Self-pay | Admitting: Medical-Surgical

## 2020-06-30 ENCOUNTER — Encounter: Payer: Self-pay | Admitting: Nurse Practitioner

## 2020-06-30 DIAGNOSIS — J029 Acute pharyngitis, unspecified: Secondary | ICD-10-CM

## 2020-06-30 DIAGNOSIS — R059 Cough, unspecified: Secondary | ICD-10-CM

## 2020-06-30 LAB — POCT INFLUENZA A/B
Influenza A, POC: NEGATIVE
Influenza B, POC: NEGATIVE

## 2020-06-30 LAB — POCT RAPID STREP A (OFFICE): Rapid Strep A Screen: NEGATIVE

## 2020-06-30 NOTE — Progress Notes (Signed)
Flu and strep negative. Awaiting results of COVID-19 test.

## 2020-06-30 NOTE — Progress Notes (Signed)
Flu, Strep, COVID collected.  Flu and strep negative.

## 2020-07-01 ENCOUNTER — Other Ambulatory Visit: Payer: Self-pay | Admitting: Nurse Practitioner

## 2020-07-01 DIAGNOSIS — R059 Cough, unspecified: Secondary | ICD-10-CM

## 2020-07-01 MED ORDER — BENZONATATE 200 MG PO CAPS: 200.0000 mg | ORAL_CAPSULE | Freq: Three times a day (TID) | ORAL | 0 refills | Status: DC | PRN

## 2020-07-01 NOTE — Progress Notes (Signed)
Order for tessalon perles and chest x-ray placed

## 2020-07-02 LAB — SARS-COV-2, NAA 2 DAY TAT

## 2020-07-02 LAB — NOVEL CORONAVIRUS, NAA: SARS-CoV-2, NAA: NOT DETECTED

## 2020-07-06 ENCOUNTER — Other Ambulatory Visit: Payer: Self-pay | Admitting: Medical-Surgical

## 2020-07-06 DIAGNOSIS — F411 Generalized anxiety disorder: Secondary | ICD-10-CM

## 2020-07-14 ENCOUNTER — Other Ambulatory Visit: Payer: Self-pay

## 2020-07-14 ENCOUNTER — Emergency Department (HOSPITAL_BASED_OUTPATIENT_CLINIC_OR_DEPARTMENT_OTHER): Payer: Self-pay

## 2020-07-14 ENCOUNTER — Encounter (HOSPITAL_BASED_OUTPATIENT_CLINIC_OR_DEPARTMENT_OTHER): Payer: Self-pay | Admitting: Emergency Medicine

## 2020-07-14 ENCOUNTER — Emergency Department (HOSPITAL_BASED_OUTPATIENT_CLINIC_OR_DEPARTMENT_OTHER)
Admission: EM | Admit: 2020-07-14 | Discharge: 2020-07-14 | Disposition: A | Discharge status: Home / Self Care | Payer: Self-pay | Attending: Emergency Medicine | Admitting: Emergency Medicine

## 2020-07-14 DIAGNOSIS — W19XXXA Unspecified fall, initial encounter: Secondary | ICD-10-CM

## 2020-07-14 DIAGNOSIS — S300XXA Contusion of lower back and pelvis, initial encounter: Secondary | ICD-10-CM | POA: Insufficient documentation

## 2020-07-14 DIAGNOSIS — Y92009 Unspecified place in unspecified non-institutional (private) residence as the place of occurrence of the external cause: Secondary | ICD-10-CM | POA: Insufficient documentation

## 2020-07-14 DIAGNOSIS — R202 Paresthesia of skin: Secondary | ICD-10-CM | POA: Insufficient documentation

## 2020-07-14 DIAGNOSIS — W108XXA Fall (on) (from) other stairs and steps, initial encounter: Secondary | ICD-10-CM | POA: Insufficient documentation

## 2020-07-14 DIAGNOSIS — Y9389 Activity, other specified: Secondary | ICD-10-CM | POA: Insufficient documentation

## 2020-07-14 DIAGNOSIS — Y999 Unspecified external cause status: Secondary | ICD-10-CM | POA: Insufficient documentation

## 2020-07-14 MED ORDER — TRAMADOL HCL 50 MG PO TABS: ORAL_TABLET | ORAL | 0 refills | Status: DC

## 2020-07-14 MED ORDER — METHOCARBAMOL 500 MG PO TABS: 500.0000 mg | ORAL_TABLET | Freq: Four times a day (QID) | ORAL | 0 refills | Status: DC | PRN

## 2020-07-14 MED ORDER — IBUPROFEN 800 MG PO TABS: 800.0000 mg | ORAL_TABLET | Freq: Three times a day (TID) | ORAL | 0 refills | Status: DC

## 2020-07-14 NOTE — ED Triage Notes (Signed)
Pt fell down stairs today injuring her low back.  Pain on right low back radiating down into right leg and up to middle back.

## 2020-07-14 NOTE — Discharge Instructions (Signed)
You most likely strain your sacroiliac joint or caused a bruise in the area by your fall.  You may take ibuprofen 800 mg every 8 hours with food for baseline pain control.  Take Robaxin for muscle spasms if needed.  If pain is not controlled by ibuprofen and Robaxin, take 1-2 tramadol tablets. Apply ice pack to the area for about 20 minutes every few hours for the first 48 hours.  After that you may try warm moist heat. See your doctor for recheck in 2 to 3 days. Return to the emergency department if you have severely increasing pain, numbness or weakness to the leg or other concerning symptoms.

## 2020-07-14 NOTE — ED Provider Notes (Signed)
[facility] EMERGENCY DEPARTMENT Provider Note   CSN: [id number] Arrival date & time: 07/14/20  1702     History Chief Complaint  Patient presents with  . Fall    [address] [place] is a 29 y.o. female.  HPI Patient reports she was going down the stairs in her home.  She slipped and fell directly onto her right sacrum area on the back.  She reports it was a very hard fall and she has had a lot of pain right across the lower area of her back and she indicates SI region.  She reports much worse with walking.  She has a little bit of a tingling sensation down in her upper thigh and hip but no weakness or numbness of the leg.  No abdominal pain.  She did not have any head injury.  No injury to the chest.  No difficulty breathing.  She has tried ibuprofen for pain with some relief but continues to have a lot of pain.    Past Medical History:  Diagnosis Date  . Common migraine with intractable migraine 12/16/2016  . Endometriosis   . Frequency of urination   . Migraine   . Pelvic pain in female   . Polycystic ovary disease     Patient Active Problem List   Diagnosis Date Noted  . Insomnia 06/05/2019  . Intractable nausea and vomiting 01/09/2019  . Poor venous access 01/07/2019  . Uncomplicated opioid dependence (HCC) 05/26/2017  . Irritable bowel syndrome 01/27/2017  . Generalized anxiety disorder 12/26/2016  . Major depressive disorder, recurrent episode, moderate (HCC) 12/26/2016  . Surgical menopause 11/14/2016  . Dyspareunia, female 11/14/2016  . Headache disorder 11/14/2016  . Constipation 11/14/2016  . Chronic pelvic pain in female 03/26/2015  . Chronic pain syndrome 03/26/2015  . History of left salpingo-oophorectomy 03/26/2015  . Lumbago 07/04/2014    Past Surgical History:  Procedure Laterality Date  . ABDOMINAL HYSTERECTOMY    . DX LAPAROSCOPY W/ LASER ABLATION OF ENDOMETRIOSIS  06-12-2014   [facility]  . LAPAROSCOPIC OVARIAN CYSTECTOMY  Left 01/08/2015   Procedure: LAPAROSCOPIC OVARIAN CYSTECTOMY;  Surgeon: Richardean Chimera, MD;  Location: [facility];  Service: Gynecology;  Laterality: Left;  . LAPAROSCOPY N/A 01/08/2015   Procedure: LAPAROSCOPY DIAGNOSTIC;  Surgeon: Richardean Chimera, MD;  Location: [facility];  Service: Gynecology;  Laterality: N/A;  . TONSILLECTOMY  age 78     OB History    Gravida  1   Para      Term      Preterm      AB  1   Living        SAB  1   TAB      Ectopic      Multiple      Live Births              Family History  Problem Relation Age of Onset  . Diabetes Sister   . Diabetes Maternal Grandfather   . Hypertension Maternal Grandfather   . Cancer Maternal Grandfather   . Hypertension Paternal Grandmother   . Alcohol abuse Mother   . Cancer Maternal Grandmother   . Cancer Paternal Grandfather   . Heart attack Neg Hx     Social History   Tobacco Use  . Smoking status: Never Smoker  . Smokeless tobacco: Never Used  Substance Use Topics  . Alcohol use: Yes    Comment: occasional  . Drug use: No  Home Medications Prior to Admission medications   Medication Sig Start Date End Date Taking? Authorizing Provider  albuterol (VENTOLIN HFA) 108 (90 Base) MCG/ACT inhaler Inhale 1-2 puffs into the lungs every 6 (six) hours as needed for wheezing or shortness of breath. 03/02/20   Early, Sung Amabile, NP  ALPRAZolam (XANAX) 0.25 MG tablet TAKE 1 TABLET(0.25 MG) BY MOUTH DAILY AS NEEDED FOR ANXIETY 07/09/20   Christen Butter, NP  benzonatate (TESSALON) 200 MG capsule Take 1 capsule (200 mg total) by mouth 3 (three) times daily as needed for cough. 07/01/20   [name], NP  ibuprofen (ADVIL) 800 MG tablet Take 1 tablet (800 mg total) by mouth 3 (three) times daily. 07/14/20   Arby Barrette, MD  ibuprofen (ADVIL,MOTRIN) 800 MG tablet Take 800 mg by mouth every 8 (eight) hours as needed for headache or moderate pain.     [provider]  ipratropium  (ATROVENT) 0.03 % nasal spray Place 2 sprays into both nostrils every 12 (twelve) hours. 03/16/20   Christen Butter, NP  methocarbamol (ROBAXIN) 500 MG tablet Take 1 tablet (500 mg total) by mouth every 6 (six) hours as needed for muscle spasms. 07/14/20   Arby Barrette, MD  promethazine (PHENERGAN) 25 MG tablet Take 1 tablet (25 mg total) by mouth every 6 (six) hours as needed for nausea or vomiting. 03/06/20   Early, Sung Amabile, NP  promethazine-dextromethorphan (PROMETHAZINE-DM) 6.25-15 MG/5ML syrup Take 5 mLs by mouth 4 (four) times daily as needed for cough. 06/29/20   [name], NP  traMADol Janean Sark) 50 MG tablet 1 to 2 tablets every 6 hours for pain not relieved by ibuprofen 07/14/20   Arby Barrette, MD    Allergies    Buprenorphine  Review of Systems   Review of Systems Constitutional: No fever no chills no malaise Respiratory: No shortness of breath no chest pain GI: No abdominal pain no nausea no vomiting Neurologic: No weakness numbness or tingling of extremities. Physical Exam Updated Vital Signs BP 109/80 (BP Location: Left Arm)   Pulse 87   Temp 98.7 F (37.1 C) (Oral)   Resp 18   Ht 5\' 2"  (1.575 m)   Wt 64.7 kg   LMP 12/11/2014 (Approximate)   SpO2 100%   BMI 26.08 kg/m   Physical Exam Constitutional:      Appearance: Normal appearance.  HENT:     Head: Normocephalic and atraumatic.  Eyes:     Extraocular Movements: Extraocular movements intact.  Cardiovascular:     Rate and Rhythm: Normal rate and regular rhythm.  Pulmonary:     Effort: Pulmonary effort is normal.     Breath sounds: Normal breath sounds.  Abdominal:     General: There is no distension.     Palpations: Abdomen is soft.     Tenderness: There is no abdominal tenderness. There is no guarding.  Musculoskeletal:     Comments: Patient has reproducible pain along the SI joint on the right.  No visible soft tissue contusion or abrasion at this time.  No midline C-spine tenderness.  No deformity of  the leg.  No swelling of the lower extremities.  Patient is able to bear weight with slightly antalgic gait.  No weakness.  Skin:    General: Skin is warm and dry.  Neurological:     General: No focal deficit present.     Mental Status: She is alert and oriented to person, place, and time.     Coordination: Coordination normal.  Psychiatric:        Mood and Affect: Mood normal.     ED Results / Procedures / Treatments   Labs (all labs ordered are listed, but only abnormal results are displayed) Labs Reviewed - No data to display  EKG None  Radiology DG Hip Unilat W or Wo Pelvis 2-3 Views Right  Result Date: 07/14/2020 CLINICAL DATA:  Status post fall. EXAM: DG HIP (WITH OR WITHOUT PELVIS) 2-3V RIGHT COMPARISON:  None. FINDINGS: There is no evidence of hip fracture or dislocation. There is no evidence of arthropathy or other focal bone abnormality. IMPRESSION: Negative. Electronically Signed   By: Aram Candela M.D.   On: 07/14/2020 18:34    Procedures Procedures (including critical care time)  Medications Ordered in ED Medications - No data to display  ED Course  I have reviewed the triage vital signs and the nursing notes.  Pertinent labs & imaging results that were available during my care of the patient were reviewed by me and considered in my medical decision making (see chart for details).    MDM Rules/Calculators/A&P                          Mechanical fall earlier today.  Pain is consistent with SI joint strain or contusion.  X-ray does not show any acute findings.  No neurologic dysfunction.  At this time plan will be for ibuprofen for baseline pain control, Robaxin for muscle relaxer and tramadol as needed for additional pain control.  Return precautions reviewed. Final Clinical Impression(s) / ED Diagnoses Final diagnoses:  Sacral contusion, initial encounter  Fall, initial encounter    Rx / DC Orders ED Discharge Orders         Ordered     methocarbamol (ROBAXIN) 500 MG tablet  Every 6 hours PRN        07/14/20 1936    traMADol (ULTRAM) 50 MG tablet  Status:  Discontinued        07/14/20 1936    ibuprofen (ADVIL) 800 MG tablet  3 times daily        07/14/20 1936    traMADol (ULTRAM) 50 MG tablet        07/14/20 1937           Arby Barrette, MD 07/14/20 1943

## 2020-07-17 ENCOUNTER — Other Ambulatory Visit: Payer: Self-pay

## 2020-07-17 ENCOUNTER — Ambulatory Visit (INDEPENDENT_AMBULATORY_CARE_PROVIDER_SITE_OTHER): Payer: Self-pay | Admitting: Sports Medicine

## 2020-07-17 ENCOUNTER — Ambulatory Visit: Payer: Self-pay

## 2020-07-17 DIAGNOSIS — M533 Sacrococcygeal disorders, not elsewhere classified: Secondary | ICD-10-CM

## 2020-07-17 NOTE — Assessment & Plan Note (Signed)
Amanda Vazquez is a 29 year old female, she recently had a fall, she was seen in the ED where x-rays were negative, she was given some ibuprofen and tramadol as well as a muscle relaxer which has not helped her pain very much. Pain is localized over the right sacroiliac joint with radiation into the buttock and thigh. Nothing overtly radicular, no red flags. X-rays personally reviewed and are unremarkable, today we injected her SI joint, return to see me in 4 to 6 weeks. Amanda Vazquez does admit that there has been a problem with pain medications in the past and understands that we will not be prescribing controlled substances.

## 2020-07-17 NOTE — Progress Notes (Signed)
    Procedures performed today:    Procedure: Real-time Ultrasound Guided injection of the right sacroiliac joint Device: Samsung HS60  Verbal informed consent obtained.  Time-out conducted.  Noted no overlying erythema, induration, or other signs of local infection.  Skin prepped in a sterile fashion.  Local anesthesia: Topical Ethyl chloride.  With sterile technique and under real time ultrasound guidance: 1 cc Kenalog 40, 2 cc lidocaine, 2 cc bupivacaine injected easily Completed without difficulty  Pain immediately resolved suggesting accurate placement of the medication.  Advised to call if fevers/chills, erythema, induration, drainage, or persistent bleeding.  Images permanently stored and available for review in PACS.  Impression: Technically successful ultrasound guided injection.  Independent interpretation of notes and tests performed by another provider:   None.  Brief History, Exam, Impression, and Recommendations:    Pain of right sacroiliac joint Amanda Vazquez is a 29 year old female, she recently had a fall, she was seen in the ED where x-rays were negative, she was given some ibuprofen and tramadol as well as a muscle relaxer which has not helped her pain very much. Pain is localized over the right sacroiliac joint with radiation into the buttock and thigh. Nothing overtly radicular, no red flags. X-rays personally reviewed and are unremarkable, today we injected her SI joint, return to see me in 4 to 6 weeks. Corra does admit that there has been a problem with pain medications in the past and understands that we will not be prescribing controlled substances.    ___________________________________________ Ihor Austin. Benjamin Stain, M.D., ABFM., CAQSM. Primary Care and Sports Medicine [place] [facility]  Adjunct Instructor of Family Medicine  [employer]

## 2020-07-20 MED ORDER — GABAPENTIN 300 MG PO CAPS: ORAL_CAPSULE | ORAL | 3 refills | Status: DC

## 2020-07-24 MED ORDER — CYCLOBENZAPRINE HCL 10 MG PO TABS: ORAL_TABLET | ORAL | 0 refills | Status: DC

## 2020-07-24 NOTE — Addendum Note (Signed)
Addended by: Monica Becton on: 07/24/2020 04:32 PM   Modules accepted: Orders

## 2020-07-24 NOTE — Addendum Note (Signed)
Addended by: Monica Becton on: 07/24/2020 11:37 AM   Modules accepted: Orders

## 2020-08-10 ENCOUNTER — Encounter: Payer: Self-pay | Admitting: Medical-Surgical

## 2020-08-10 ENCOUNTER — Other Ambulatory Visit: Payer: Self-pay | Admitting: Medical-Surgical

## 2020-08-10 DIAGNOSIS — F411 Generalized anxiety disorder: Secondary | ICD-10-CM

## 2020-08-14 ENCOUNTER — Ambulatory Visit: Payer: Self-pay | Admitting: Sports Medicine

## 2020-09-03 ENCOUNTER — Other Ambulatory Visit: Payer: Self-pay | Admitting: Medical-Surgical

## 2020-09-03 DIAGNOSIS — F411 Generalized anxiety disorder: Secondary | ICD-10-CM

## 2020-09-07 ENCOUNTER — Other Ambulatory Visit: Payer: Self-pay | Admitting: Medical-Surgical

## 2020-09-07 DIAGNOSIS — F411 Generalized anxiety disorder: Secondary | ICD-10-CM

## 2020-09-22 ENCOUNTER — Telehealth (INDEPENDENT_AMBULATORY_CARE_PROVIDER_SITE_OTHER): Payer: Self-pay | Admitting: Osteopathic Medicine

## 2020-09-22 ENCOUNTER — Encounter: Payer: Self-pay | Admitting: Osteopathic Medicine

## 2020-09-22 VITALS — Temp 101.0°F | Wt 130.0 lb

## 2020-09-22 DIAGNOSIS — Z20822 Contact with and (suspected) exposure to covid-19: Secondary | ICD-10-CM

## 2020-09-22 DIAGNOSIS — R6889 Other general symptoms and signs: Secondary | ICD-10-CM

## 2020-09-22 MED ORDER — PROMETHAZINE-CODEINE 6.25-10 MG/5ML PO SYRP
7.5000 mL | ORAL_SOLUTION | Freq: Four times a day (QID) | ORAL | 0 refills | Status: DC | PRN
Start: 2020-09-22 — End: 2020-12-28

## 2020-09-22 MED ORDER — PREDNISONE 20 MG PO TABS: 20.0000 mg | ORAL_TABLET | Freq: Two times a day (BID) | ORAL | 0 refills | Status: DC

## 2020-09-22 NOTE — Progress Notes (Signed)
Attempted to contact patient at 1145 am, no answer. Left a detailed vm msg.

## 2020-09-22 NOTE — Progress Notes (Signed)
Telemedicine Visit via  Video & Audio (App used: MyChart)   I connected with [employer] on 09/22/20 at 2:02 PM  by phone or  telemedicine application as noted above  I verified that I am speaking with or regarding  the correct patient using two identifiers.  Participants: Myself, Dr Sunnie Nielsen DO Patient: Amanda Vazquez Patient proxy if applicable: none Other, if applicable: none  Patient is in separate location from myself  I am in office at [facility]    I discussed the limitations of evaluation and management  by telemedicine and the availability of in person appointments.  The participant(s) above expressed understanding and  agreed to proceed with this appointment via telemedicine.       History of Present Illness: [name] is a 29 y.o. female who would like to discuss feeling sick. Onset of illness 09/18/20, negative COVID rapid test yesterday. Not COVID vaccinated. She was with family last week then they tested (+)COVID    Observations/Objective: Temp (!) 101 F (38.3 C)   Wt 130 lb (59 kg)   LMP 12/11/2014 (Approximate)   BMI 23.78 kg/m  BP Readings from Last 3 Encounters:  07/14/20 109/80  04/07/20 129/86  03/16/20 119/81   Exam: Normal Speech.  NAD  Lab and Radiology Results No results found for this or any previous visit (from the past 72 hour(s)). No results found.     Assessment and Plan: 30 y.o. female with The primary encounter diagnosis was Suspected COVID-19 virus infection. A diagnosis of Flu-like symptoms was also pertinent to this visit.  Given COVID exposure (she was with family who was likely pre-symptomatic shedding) I am disinclined to trust a negative rapid test in this setting. Would be more reassured if PCR negative. Also possible flu. Symptomatic care for now!    PDMP not reviewed this encounter. No orders of the defined types were placed in this encounter.  Meds ordered this encounter   Medications  . predniSONE (DELTASONE) 20 MG tablet    Sig: Take 1 tablet (20 mg total) by mouth 2 (two) times daily with a meal.    Dispense:  10 tablet    Refill:  0  . promethazine-codeine (PHENERGAN WITH CODEINE) 6.25-10 MG/5ML syrup    Sig: Take 7.5 mLs by mouth every 6 (six) hours as needed for cough.    Dispense:  180 mL    Refill:  0   There are no Patient Instructions on file for this visit.     Follow Up Instructions: Return for COVID AND FLU SWAB - NUSE VISIT FOR TOMORROW .    I discussed the assessment and treatment plan with the patient. The patient was provided an opportunity to ask questions and all were answered. The patient agreed with the plan and demonstrated an understanding of the instructions.   The patient was advised to call back or seek an in-person evaluation if any new concerns, if symptoms worsen or if the condition fails to improve as anticipated.  30 minutes of non-face-to-face time was provided during this encounter.      . . . . . . . . . . . . . Marland Kitchen                   Historical information moved to improve visibility of documentation.  Past Medical History:  Diagnosis Date  . Common migraine with intractable migraine 12/16/2016  . Endometriosis   . Frequency of urination   . Migraine   .  Pelvic pain in female   . Polycystic ovary disease    Past Surgical History:  Procedure Laterality Date  . ABDOMINAL HYSTERECTOMY    . DX LAPAROSCOPY W/ LASER ABLATION OF ENDOMETRIOSIS  06-12-2014   [facility]  . LAPAROSCOPIC OVARIAN CYSTECTOMY Left 01/08/2015   Procedure: LAPAROSCOPIC OVARIAN CYSTECTOMY;  Surgeon: Richardean Chimera, MD;  Location: [facility];  Service: Gynecology;  Laterality: Left;  . LAPAROSCOPY N/A 01/08/2015   Procedure: LAPAROSCOPY DIAGNOSTIC;  Surgeon: Richardean Chimera, MD;  [facility];  Service: Gynecology;  Laterality: N/A;  . TONSILLECTOMY  age 54    Social History   Tobacco Use  . Smoking status: Never Smoker  . Smokeless tobacco: Never Used  Substance Use Topics  . Alcohol use: Yes    Comment: occasional   family history includes Alcohol abuse in her mother; Cancer in her maternal grandfather, maternal grandmother, and paternal grandfather; Diabetes in her maternal grandfather and sister; Hypertension in her maternal grandfather and paternal grandmother.  Medications: Current Outpatient Medications  Medication Sig Dispense Refill  . albuterol (VENTOLIN HFA) 108 (90 Base) MCG/ACT inhaler Inhale 1-2 puffs into the lungs every 6 (six) hours as needed for wheezing or shortness of breath. 8 g 1  . ALPRAZolam (XANAX) 0.25 MG tablet TAKE 1 TABLET(0.25 MG) BY MOUTH DAILY AS NEEDED FOR ANXIETY 30 tablet 0  . cyclobenzaprine (FLEXERIL) 10 MG tablet One half to one tab PO qHS, then increase gradually to one tab TID. 30 tablet 0  . gabapentin (NEURONTIN) 300 MG capsule One tab PO qHS for a week, then BID for a week, then TID. May double weekly to a max of 3,600mg /day 90 capsule 3  . ibuprofen (ADVIL) 800 MG tablet Take 1 tablet (800 mg total) by mouth 3 (three) times daily. 21 tablet 0  . ipratropium (ATROVENT) 0.03 % nasal spray Place 2 sprays into both nostrils every 12 (twelve) hours. 30 mL 0  . predniSONE (DELTASONE) 20 MG tablet Take 1 tablet (20 mg total) by mouth 2 (two) times daily with a meal. 10 tablet 0  . promethazine (PHENERGAN) 25 MG tablet Take 1 tablet (25 mg total) by mouth every 6 (six) hours as needed for nausea or vomiting. 30 tablet 1  . promethazine-codeine (PHENERGAN WITH CODEINE) 6.25-10 MG/5ML syrup Take 7.5 mLs by mouth every 6 (six) hours as needed for cough. 180 mL 0  . traMADol (ULTRAM) 50 MG tablet 1 to 2 tablets every 6 hours for pain not relieved by ibuprofen 15 tablet 0   No current facility-administered medications for this visit.   Allergies  Allergen Reactions  . Buprenorphine Nausea Only

## 2020-09-23 ENCOUNTER — Ambulatory Visit: Payer: Self-pay

## 2020-09-29 ENCOUNTER — Encounter: Payer: Self-pay | Admitting: Osteopathic Medicine

## 2020-10-03 ENCOUNTER — Encounter (HOSPITAL_BASED_OUTPATIENT_CLINIC_OR_DEPARTMENT_OTHER): Payer: Self-pay | Admitting: Emergency Medicine

## 2020-10-03 ENCOUNTER — Emergency Department (HOSPITAL_BASED_OUTPATIENT_CLINIC_OR_DEPARTMENT_OTHER)
Admission: EM | Admit: 2020-10-03 | Discharge: 2020-10-03 | Disposition: A | Discharge status: Home / Self Care | Payer: Self-pay | Attending: Emergency Medicine | Admitting: Emergency Medicine

## 2020-10-03 ENCOUNTER — Emergency Department (HOSPITAL_BASED_OUTPATIENT_CLINIC_OR_DEPARTMENT_OTHER): Payer: Self-pay

## 2020-10-03 ENCOUNTER — Other Ambulatory Visit: Payer: Self-pay

## 2020-10-03 DIAGNOSIS — S8012XA Contusion of left lower leg, initial encounter: Secondary | ICD-10-CM | POA: Insufficient documentation

## 2020-10-03 DIAGNOSIS — W19XXXA Unspecified fall, initial encounter: Secondary | ICD-10-CM

## 2020-10-03 DIAGNOSIS — W100XXA Fall (on)(from) escalator, initial encounter: Secondary | ICD-10-CM | POA: Insufficient documentation

## 2020-10-03 DIAGNOSIS — T148XXA Other injury of unspecified body region, initial encounter: Secondary | ICD-10-CM

## 2020-10-03 MED ORDER — CYCLOBENZAPRINE HCL 10 MG PO TABS: 10.0000 mg | ORAL_TABLET | Freq: Two times a day (BID) | ORAL | 0 refills | Status: DC | PRN

## 2020-10-03 NOTE — ED Notes (Signed)
Pt discharged to home. Discharge instructions have been discussed with patient and/or family members. Pt verbally acknowledges understanding d/c instructions, and endorses comprehension to checkout at registration before leaving.  °

## 2020-10-03 NOTE — Discharge Instructions (Signed)
Take Tylenol 1000 mg 4 times a day for 1 week. This is the maximum dose of Tylenol (acetaminophen) you can take from all sources. Please check other over-the-counter medications and prescriptions to ensure you are not taking other medications that contain acetaminophen.  You may also take ibuprofen 400 mg 6 times a day alternating with or at the same time as tylenol.  You may also take the prescribed flexeril for pain.

## 2020-10-03 NOTE — ED Triage Notes (Signed)
Pt fell while going down stairs yesterday. C/o L lower leg pain.

## 2020-10-06 NOTE — ED Provider Notes (Signed)
[facility] EMERGENCY DEPARTMENT Provider Note   CSN: [id number] Arrival date & time: 10/03/20  5625     History Chief Complaint  Patient presents with  . Fall    [address] Amanda Vazquez is a 30 y.o. female.  HPI      30yo female presents with concern for left lower leg pain after falling on the escalator yesterday.   Reports her shoe got caught in the escalator causing her to fall and hurt her shin/anterior left lower leg.  Occurred yesterday and has been taking OTC medications without relief.  Pain is severe, worse with palpation, movement, particularly dorsiflexion.  Denies other injuries from fall, no head trauma, LOC, neck pain. Denies other acute concerns today  Past Medical History:  Diagnosis Date  . Common migraine with intractable migraine 12/16/2016  . Endometriosis   . Frequency of urination   . Migraine   . Pelvic pain in female   . Polycystic ovary disease     Patient Active Problem List   Diagnosis Date Noted  . Pain of right sacroiliac joint 07/17/2020  . Insomnia 06/05/2019  . Intractable nausea and vomiting 01/09/2019  . Poor venous access 01/07/2019  . Uncomplicated opioid dependence (HCC) 05/26/2017  . Irritable bowel syndrome 01/27/2017  . Generalized anxiety disorder 12/26/2016  . Major depressive disorder, recurrent episode, moderate (HCC) 12/26/2016  . Surgical menopause 11/14/2016  . Dyspareunia, female 11/14/2016  . Headache disorder 11/14/2016  . Constipation 11/14/2016  . Chronic pelvic pain in female 03/26/2015  . Chronic pain syndrome 03/26/2015  . History of left salpingo-oophorectomy 03/26/2015  . Lumbago 07/04/2014    Past Surgical History:  Procedure Laterality Date  . ABDOMINAL HYSTERECTOMY    . DX LAPAROSCOPY W/ LASER ABLATION OF ENDOMETRIOSIS  06-12-2014   [facility]  . LAPAROSCOPIC OVARIAN CYSTECTOMY Left 01/08/2015   Procedure: LAPAROSCOPIC OVARIAN CYSTECTOMY;  Surgeon: Richardean Chimera, MD;  Location: [facility]  [place];  Service: Gynecology;  Laterality: Left;  . LAPAROSCOPY N/A 01/08/2015   Procedure: LAPAROSCOPY DIAGNOSTIC;  Surgeon: Richardean Chimera, MD;  Location: [facility];  Service: Gynecology;  Laterality: N/A;  . TONSILLECTOMY  age 39     OB History    Gravida  1   Para      Term      Preterm      AB  1   Living        SAB  1   IAB      Ectopic      Multiple      Live Births              Family History  Problem Relation Age of Onset  . Diabetes Sister   . Diabetes Maternal Grandfather   . Hypertension Maternal Grandfather   . Cancer Maternal Grandfather   . Hypertension Paternal Grandmother   . Alcohol abuse Mother   . Cancer Maternal Grandmother   . Cancer Paternal Grandfather   . Heart attack Neg Hx     Social History   Tobacco Use  . Smoking status: Never Smoker  . Smokeless tobacco: Never Used  Substance Use Topics  . Alcohol use: Yes    Comment: occasional  . Drug use: No    Home Medications Prior to Admission medications   Medication Sig Start Date End Date Taking? Authorizing Provider  cyclobenzaprine (FLEXERIL) 10 MG tablet Take 1 tablet (10 mg total) by mouth 2 (two) times daily as needed for muscle  spasms. 10/03/20  Yes Alvira Monday, MD  albuterol (VENTOLIN HFA) 108 (90 Base) MCG/ACT inhaler Inhale 1-2 puffs into the lungs every 6 (six) hours as needed for wheezing or shortness of breath. 03/02/20   Early, Sung Amabile, NP  ALPRAZolam (XANAX) 0.25 MG tablet TAKE 1 TABLET(0.25 MG) BY MOUTH DAILY AS NEEDED FOR ANXIETY 09/07/20   Christen Butter, NP  gabapentin (NEURONTIN) 300 MG capsule One tab PO qHS for a week, then BID for a week, then TID. May double weekly to a max of 3,600mg /day 07/20/20   Monica Becton, MD  ibuprofen (ADVIL) 800 MG tablet Take 1 tablet (800 mg total) by mouth 3 (three) times daily. 07/14/20   Arby Barrette, MD  ipratropium (ATROVENT) 0.03 % nasal spray Place 2 sprays into both nostrils every 12  (twelve) hours. 03/16/20   Christen Butter, NP  predniSONE (DELTASONE) 20 MG tablet Take 1 tablet (20 mg total) by mouth 2 (two) times daily with a meal. 09/22/20   Sunnie Nielsen, DO  promethazine (PHENERGAN) 25 MG tablet Take 1 tablet (25 mg total) by mouth every 6 (six) hours as needed for nausea or vomiting. 03/06/20   Early, Sung Amabile, NP  promethazine-codeine (PHENERGAN WITH CODEINE) 6.25-10 MG/5ML syrup Take 7.5 mLs by mouth every 6 (six) hours as needed for cough. 09/22/20   Sunnie Nielsen, DO  traMADol Janean Sark) 50 MG tablet 1 to 2 tablets every 6 hours for pain not relieved by ibuprofen 07/14/20   Arby Barrette, MD    Allergies    Buprenorphine  Review of Systems   Review of Systems  Constitutional: Negative for fever.  Musculoskeletal: Positive for arthralgias and gait problem.  Skin: Positive for wound.  Neurological: Negative for syncope and headaches.    Physical Exam Updated Vital Signs BP 115/80 (BP Location: Right Arm)   Pulse 74   Temp 98.5 F (36.9 C) (Oral)   Resp 16   Ht 5\' 2"  (1.575 m)   Wt 59 kg   LMP 12/11/2014 (Approximate)   SpO2 99%   BMI 23.78 kg/m   Physical Exam Vitals and nursing note reviewed.  Constitutional:      General: She is not in acute distress.    Appearance: Normal appearance. She is not ill-appearing, toxic-appearing or diaphoretic.  HENT:     Head: Normocephalic.  Eyes:     Conjunctiva/sclera: Conjunctivae normal.  Cardiovascular:     Rate and Rhythm: Normal rate and regular rhythm.     Pulses: Normal pulses.  Pulmonary:     Effort: Pulmonary effort is normal. No respiratory distress.  Musculoskeletal:        General: Tenderness (Left tibia) present. No deformity or signs of injury.     Cervical back: No rigidity.  Skin:    General: Skin is warm and dry.     Coloration: Skin is not jaundiced or pale.     Comments: Abrasion anterior left lower leg without surrounding erythema  Neurological:     General: No focal deficit  present.     Mental Status: She is alert and oriented to person, place, and time.     ED Results / Procedures / Treatments   Labs (all labs ordered are listed, but only abnormal results are displayed) Labs Reviewed - No data to display  EKG None  Radiology No results found.  Procedures Procedures (including critical care time)  Medications Ordered in ED Medications - No data to display  ED Course  I have reviewed the triage  vital signs and the nursing notes.  Pertinent labs & imaging results that were available during my care of the patient were reviewed by me and considered in my medical decision making (see chart for details).    MDM Rules/Calculators/A&P                          29yo female presents with concern for left lower leg pain after falling on the escalator yesterday.  Normal pulses, no sign of compartment syndrome, infection, DVT> Has abrasion and tenderness over tibia. XR shows no sign of fracture. Suspect pain related to contusion and muscular contusion. Recommend ibuprofen/tylenol, given rx for flexeril. Patient discharged in stable condition with understanding of reasons to return.   Final Clinical Impression(s) / ED Diagnoses Final diagnoses:  Fall, initial encounter  Contusion of left lower leg, initial encounter  Abrasion    Rx / DC Orders ED Discharge Orders         Ordered    cyclobenzaprine (FLEXERIL) 10 MG tablet  2 times daily PRN        10/03/20 2637           Alvira Monday, MD 10/06/20 1437

## 2020-10-07 ENCOUNTER — Telehealth: Payer: Self-pay

## 2020-10-07 ENCOUNTER — Encounter: Payer: Self-pay | Admitting: Osteopathic Medicine

## 2020-10-07 ENCOUNTER — Other Ambulatory Visit: Payer: Self-pay | Admitting: Medical-Surgical

## 2020-10-07 DIAGNOSIS — F411 Generalized anxiety disorder: Secondary | ICD-10-CM

## 2020-10-07 MED ORDER — ALPRAZOLAM 0.25 MG PO TABS: 0.2500 mg | ORAL_TABLET | Freq: Every day | ORAL | 0 refills | Status: DC | PRN

## 2020-10-07 NOTE — Telephone Encounter (Signed)
Ok to get the refill today but her next prescription refill will be not be available for greater than 30 days. Advise using sparingly and only as needed for severe anxiety. Xanax is not intended to be used daily or for long periods of time. Recommend a slow weaning and discontinuation of the medication within the next 6 months. Due to regulations and prescribing laws for NPs in [place], I will not be managing chronic/long-term benzodiazepines in the future.

## 2020-10-07 NOTE — Telephone Encounter (Signed)
Called pharmacy and authorized early pick up of alprazolam. Pt aware of Joy's response and that she needs to start weaning off of the medication and plan on complete discontinuation over the next 6 months. No further questions or concerns at this time.

## 2020-10-07 NOTE — Telephone Encounter (Signed)
Last written 09/07/2020 #30 no refills Last appt 09/22/2020

## 2020-10-07 NOTE — Telephone Encounter (Signed)
Pt states her pharmacy got the refill auth for her alprazolam, but it is 1 day too early for her to be able to get it. She is leaving this evening at 6:00 to go to [place] to be with her family. The pharmacy told her she could call us and we could call them and authorize her to be able to get it a day early.

## 2020-11-10 ENCOUNTER — Other Ambulatory Visit: Payer: Self-pay | Admitting: Medical-Surgical

## 2020-11-10 DIAGNOSIS — F411 Generalized anxiety disorder: Secondary | ICD-10-CM

## 2020-11-11 ENCOUNTER — Other Ambulatory Visit: Payer: Self-pay | Admitting: Medical-Surgical

## 2020-11-11 ENCOUNTER — Telehealth: Payer: Self-pay

## 2020-11-11 DIAGNOSIS — F411 Generalized anxiety disorder: Secondary | ICD-10-CM

## 2020-11-11 NOTE — Telephone Encounter (Signed)
Pt called and said she contacted her pharmacy yesterday regarding getting a refill of alprazolam. She last picked it up from the pharmacy on 10/07/2020 for #30 tablets. Looking in that encounter, she was told:  "Advise using sparingly and only as needed for severe anxiety. Xanax is not intended to be used daily or for long periods of time. Recommend a slow weaning and discontinuation of the medication within the next 6 months. Due to regulations and prescribing laws for NPs in [place], I will not be managing chronic/long-term benzodiazepines in the future". I asked her how often she was taking the medication. She said that she takes it twice a day when she needs it. She said she does not need to take it everyday because her anxiety is not a daily problem, but when her anxiety does exacerbate, it is really bad and she will have to take 2 tablets that day. She said that she knows she is supposed to be weaning off of it, and that when she was seeing Dr. Denyse Amass she was taking it TID and has not decreased to PRN.   Spoke with Joy who said that she already refilled the medication this morning but only wrote for #25 tablets. Joy said that if she is taking the medication that often that this needs to either be on a controller medication or have this managed through psychiatry.   Informed pt of Joy's response and the change in the dispense quantity. She asked if there was anything that she could take that is not dangerous. I told her that there were several medications she could take and that what would be best would be for her to come in for an OV to discuss further with Joy. Pt agreeable to OV and has been scheduled to see Joy on 11/16/20 at 1:40 PM, with an arrival time of 1:30. No further questions or concerns at this time.

## 2020-11-16 ENCOUNTER — Ambulatory Visit: Payer: Self-pay | Admitting: Medical-Surgical

## 2020-11-16 DIAGNOSIS — F411 Generalized anxiety disorder: Secondary | ICD-10-CM

## 2020-11-16 DIAGNOSIS — F331 Major depressive disorder, recurrent, moderate: Secondary | ICD-10-CM

## 2020-11-24 ENCOUNTER — Encounter: Payer: Self-pay | Admitting: Medical-Surgical

## 2020-11-24 ENCOUNTER — Other Ambulatory Visit: Payer: Self-pay | Admitting: Medical-Surgical

## 2020-11-24 DIAGNOSIS — J069 Acute upper respiratory infection, unspecified: Secondary | ICD-10-CM

## 2020-11-24 MED ORDER — PROMETHAZINE HCL 25 MG PO TABS
25.0000 mg | ORAL_TABLET | Freq: Three times a day (TID) | ORAL | 0 refills | Status: DC | PRN
Start: 2020-11-24 — End: 2021-03-08

## 2020-12-13 ENCOUNTER — Other Ambulatory Visit: Payer: Self-pay | Admitting: Medical-Surgical

## 2020-12-13 DIAGNOSIS — F411 Generalized anxiety disorder: Secondary | ICD-10-CM

## 2020-12-16 MED ORDER — ALPRAZOLAM 0.25 MG PO TABS
0.2500 mg | ORAL_TABLET | Freq: Every day | ORAL | 0 refills | Status: DC | PRN
Start: 2020-12-16 — End: 2021-01-17

## 2020-12-28 ENCOUNTER — Ambulatory Visit (INDEPENDENT_AMBULATORY_CARE_PROVIDER_SITE_OTHER): Payer: Self-pay | Admitting: Family Medicine

## 2020-12-28 ENCOUNTER — Encounter: Payer: Self-pay | Admitting: Family Medicine

## 2020-12-28 VITALS — BP 114/71 | HR 100 | Wt 129.3 lb

## 2020-12-28 DIAGNOSIS — R102 Pelvic and perineal pain: Secondary | ICD-10-CM

## 2020-12-28 NOTE — Progress Notes (Signed)
Acute Office Visit  Subjective:    Patient ID: Amanda Vazquez, female    DOB: 06-Jul-1991, 29 y.o.   MRN: [id number]  Chief Complaint  Patient presents with  . Pelvic Pain    HPI Patient is in today for endometriosis pain.  Patient is in for recurrent endometriosis pain and "flare" of symptoms worse than she usually experiences.   She reports a full hysterectomy in 2017 related to PCOS and endometriosis followed by 2 more surgeries to "clean it out" again. She reports that she has been having flares for the past several years that typically happen every month and resemble period pains, but the flares seem to be spacing out a little more recently with the last one being about 2-3 months ago.    This episode started Friday evening at work. She said initially it was manageable like most of her "flares" but it quickly worsened and she ended up staying in bed all of Saturday and Sunday. She reports the pain is LLQ/suprapubic 8/10 stabbing pain that is constant all day. She states it feels like a really bad period. She has been trying 800 mg ibuprofen and extra strength tylenol alternating throughout the day as well as using a heating pad, ice packs, and resting. She states she is having some nausea associated with the pain, but no vaginal bleeding/dishcarge, blood in urine/stool, vomiting, constipation, diarrhea, change in appetite, fevers, other abdominal pain, chest pain or shortness of breath. There is no association with meals, and the only thing that specifically makes her symptoms worse is any activity; none of the remedies previously described have helped much at all.   Patient considered going to the ED this weekend because of the pain, but decided she did not want to be around other germs and wanted to wait until today. She attempted to call OBGYN but they cannot see her until next week.   Patient reports the last time she had a flare like this she was given Percocet and it really helped.  She is requesting some now.     Past Medical History:  Diagnosis Date  . Common migraine with intractable migraine 12/16/2016  . Endometriosis   . Frequency of urination   . Migraine   . Pelvic pain in female   . Polycystic ovary disease     Past Surgical History:  Procedure Laterality Date  . ABDOMINAL HYSTERECTOMY    . DX LAPAROSCOPY W/ LASER ABLATION OF ENDOMETRIOSIS  06-12-2014   [facility]  . LAPAROSCOPIC OVARIAN CYSTECTOMY Left 01/08/2015   Procedure: LAPAROSCOPIC OVARIAN CYSTECTOMY;  Surgeon: Richardean Chimera, MD;  Location[facility] [place];  Service: Gynecology;  Laterality: Left;  . LAPAROSCOPY N/A 01/08/2015   Procedure: LAPAROSCOPY DIAGNOSTIC;  Surgeon: Richardean Chimera, MD;  Location: [facility];  Service: Gynecology;  Laterality: N/A;  . TONSILLECTOMY  age 41    Family History  Problem Relation Age of Onset  . Diabetes Sister   . Diabetes Maternal Grandfather   . Hypertension Maternal Grandfather   . Cancer Maternal Grandfather   . Hypertension Paternal Grandmother   . Alcohol abuse Mother   . Cancer Maternal Grandmother   . Cancer Paternal Grandfather   . Heart attack Neg Hx     Social History   Socioeconomic History  . Marital status: Single    Spouse name: Not on file  . Number of children: 0  . Years of education: 35  . Highest education level: Not on file  Occupational History  . Not on file  Tobacco Use  . Smoking status: Never Smoker  . Smokeless tobacco: Never Used  Substance and Sexual Activity  . Alcohol use: Yes    Comment: occasional  . Drug use: No  . Sexual activity: Yes    Partners: Male    Birth control/protection: Surgical  Other Topics Concern  . Not on file  Social History Narrative   Lives w/ mother    Caffeine use: Coffee once in awhile   Right-handed   Social Determinants of Health   Financial Resource Strain: Not on file  Food Insecurity: Not on file  Transportation Needs: Not on file   Physical Activity: Not on file  Stress: Not on file  Social Connections: Not on file  Intimate Partner Violence: Not on file    Outpatient Medications Prior to Visit  Medication Sig Dispense Refill  . estradiol (ESTRACE) 1 MG tablet Take 1 mg by mouth daily.    Marland Kitchen albuterol (VENTOLIN HFA) 108 (90 Base) MCG/ACT inhaler Inhale 1-2 puffs into the lungs every 6 (six) hours as needed for wheezing or shortness of breath. 8 g 1  . ALPRAZolam (XANAX) 0.25 MG tablet Take 1 tablet (0.25 mg total) by mouth daily as needed for anxiety. 20 tablet 0  . cyclobenzaprine (FLEXERIL) 10 MG tablet Take 1 tablet (10 mg total) by mouth 2 (two) times daily as needed for muscle spasms. (Patient not taking: Reported on 12/28/2020) 10 tablet 0  . gabapentin (NEURONTIN) 300 MG capsule One tab PO qHS for a week, then BID for a week, then TID. May double weekly to a max of 3,600mg /day (Patient not taking: Reported on 12/28/2020) 90 capsule 3  . ibuprofen (ADVIL) 800 MG tablet Take 1 tablet (800 mg total) by mouth 3 (three) times daily. 21 tablet 0  . ipratropium (ATROVENT) 0.03 % nasal spray Place 2 sprays into both nostrils every 12 (twelve) hours. 30 mL 0  . promethazine (PHENERGAN) 25 MG tablet Take 1 tablet (25 mg total) by mouth every 8 (eight) hours as needed for nausea or vomiting. 15 tablet 0  . predniSONE (DELTASONE) 20 MG tablet Take 1 tablet (20 mg total) by mouth 2 (two) times daily with a meal. 10 tablet 0  . promethazine-codeine (PHENERGAN WITH CODEINE) 6.25-10 MG/5ML syrup Take 7.5 mLs by mouth every 6 (six) hours as needed for cough. 180 mL 0  . traMADol (ULTRAM) 50 MG tablet 1 to 2 tablets every 6 hours for pain not relieved by ibuprofen 15 tablet 0   No facility-administered medications prior to visit.    Allergies  Allergen Reactions  . Buprenorphine Nausea Only    Review of Systems All review of systems negative except what is listed in the HPI      Objective:    Physical Exam Constitutional:       Appearance: Normal appearance.  Abdominal:     General: There is no distension.     Palpations: Abdomen is soft. There is no mass.     Tenderness: There is abdominal tenderness. There is no rebound.     Comments: LLQ and suprapubic region tender to palpation   Musculoskeletal:        General: Normal range of motion.     Cervical back: Normal range of motion.  Skin:    General: Skin is warm and dry.     Capillary Refill: Capillary refill takes less than 2 seconds.  Neurological:     General: No  focal deficit present.     Mental Status: She is alert and oriented to person, place, and time.  Psychiatric:        Mood and Affect: Mood normal.        Behavior: Behavior normal.        Thought Content: Thought content normal.        Judgment: Judgment normal.     BP 114/71   Pulse 100   Wt 129 lb 4.8 oz (58.7 kg)   LMP 12/11/2014 (Approximate)   SpO2 100%   BMI 23.65 kg/m  Wt Readings from Last 3 Encounters:  12/28/20 129 lb 4.8 oz (58.7 kg)  10/03/20 130 lb (59 kg)  09/22/20 130 lb (59 kg)    Health Maintenance Due  Topic Date Due  . Hepatitis C Screening  Never done    There are no preventive care reminders to display for this patient.   No results found for: TSH Lab Results  Component Value Date   WBC 7.2 01/08/2019   HGB 13.0 01/08/2019   HCT 38.2 01/08/2019   MCV 87.8 01/08/2019   PLT 357 01/08/2019   Lab Results  Component Value Date   NA 141 01/08/2019   K 3.3 (L) 01/08/2019   CO2 23 01/08/2019   GLUCOSE 93 01/08/2019   BUN 13 01/08/2019   CREATININE 0.74 01/08/2019   BILITOT 0.9 01/08/2019   ALKPHOS 53 01/08/2019   AST 18 01/08/2019   ALT 13 01/08/2019   PROT 8.0 01/08/2019   ALBUMIN 5.2 (H) 01/08/2019   CALCIUM 9.5 01/08/2019   ANIONGAP 14 01/08/2019   No results found for: CHOL No results found for: HDL No results found for: LDLCALC No results found for: TRIG No results found for: CHOLHDL No results found for: VOZD6U      Assessment & Plan:   1. Pelvic pain Patient with recurrent pelvic pain worse in the left side and suprapubic region. She is requesting percocet, however after her chart review and discussing with her PCP I do not feel comfortable giving any controlled substances at this time. Discussed supportive measures with patient including tylenol, ibuprofen (possibly switching to Aleve if she wants to see if she gets any better relief with this), heating pad, herbal tea (ex Tension Tamer Tea), gentle abdominal massage/pressure, rest, hydration. Encouraged her to schedule an appointment with her OBGYN since her symptoms are worse than usual and have been tending to recur only on the left side. They may want to do further investigation or change her medication regimen. Educated on signs and symptoms requiring urgent evaluation.  Follow-up as needed.   Clayborne Dana, NP

## 2021-01-17 ENCOUNTER — Other Ambulatory Visit: Payer: Self-pay | Admitting: Medical-Surgical

## 2021-01-17 DIAGNOSIS — F411 Generalized anxiety disorder: Secondary | ICD-10-CM

## 2021-01-18 MED ORDER — ALPRAZOLAM 0.25 MG PO TABS
0.2500 mg | ORAL_TABLET | Freq: Every day | ORAL | 0 refills | Status: DC | PRN
Start: 2021-01-18 — End: 2021-02-11

## 2021-02-11 ENCOUNTER — Other Ambulatory Visit: Payer: Self-pay | Admitting: Medical-Surgical

## 2021-02-11 DIAGNOSIS — F411 Generalized anxiety disorder: Secondary | ICD-10-CM

## 2021-02-12 MED ORDER — ALPRAZOLAM 0.25 MG PO TABS
0.2500 mg | ORAL_TABLET | Freq: Every day | ORAL | 0 refills | Status: DC | PRN
Start: 2021-02-17 — End: 2021-03-22

## 2021-03-01 ENCOUNTER — Telehealth (INDEPENDENT_AMBULATORY_CARE_PROVIDER_SITE_OTHER): Payer: Self-pay | Admitting: Physician Assistant

## 2021-03-01 ENCOUNTER — Encounter: Payer: Self-pay | Admitting: Physician Assistant

## 2021-03-01 VITALS — Temp 99.3°F | Ht 62.0 in | Wt 129.0 lb

## 2021-03-01 DIAGNOSIS — J329 Chronic sinusitis, unspecified: Secondary | ICD-10-CM

## 2021-03-01 DIAGNOSIS — J40 Bronchitis, not specified as acute or chronic: Secondary | ICD-10-CM

## 2021-03-01 MED ORDER — AZITHROMYCIN 250 MG PO TABS: ORAL_TABLET | ORAL | 0 refills | Status: DC

## 2021-03-01 MED ORDER — METHYLPREDNISOLONE 4 MG PO TBPK
ORAL_TABLET | ORAL | 0 refills | Status: DC
Start: 2021-03-01 — End: 2021-05-24

## 2021-03-01 MED ORDER — HYDROCOD POLST-CPM POLST ER 10-8 MG/5ML PO SUER: 5.0000 mL | Freq: Two times a day (BID) | ORAL | 0 refills | Status: DC | PRN

## 2021-03-01 NOTE — Progress Notes (Signed)
Started Tuesday Cough  Body aches Congestion Feels like "elephant is sitting on chest"  2 home Covid tests negative  Taking cold and flu cough syrup Sinus meds Tylenol/ibuprofen

## 2021-03-01 NOTE — Progress Notes (Signed)
Patient ID: Amanda Vazquez, female   DOB: 19-Nov-1990, 30 y.o.   MRN: [id number] .Marland KitchenVirtual Visit via Video Note  I connected with [employer] on 03/01/21 at  8:30 AM EDT by a video enabled telemedicine application and verified that I am speaking with the correct person using two identifiers.  Location: Patient: home Provider: clinic  .Marland KitchenParticipating in visit:  Patient: Hospital doctor Provider: Tandy Gaw PA-C   I discussed the limitations of evaluation and management by telemedicine and the availability of in person appointments. The patient expressed understanding and agreed to proceed.  History of Present Illness: Patient is a 30 year old female with cough, body aches, congestion, sinus pressure, chest tightness for 6 days who presents to the clinic for intervention.  She does have a positive COVID exposure but she has had 2 negative COVID test.  She has not had the vaccination.  She has been taking Robitussin, TheraFlu, Mucinex D, Robitussin with little to no relief.  Her cough is very productive and she is blowing out green snot.  She has a low-grade fever of 99-100.  She is using her albuterol regularly for her chest tightness.  It does at times feel like an elephant is sitting on her chest.  She does not have any known asthma.  .. Active Ambulatory Problems    Diagnosis Date Noted  . Chronic pelvic pain in female 03/26/2015  . Surgical menopause 11/14/2016  . Dyspareunia, female 11/14/2016  . Headache disorder 11/14/2016  . Constipation 11/14/2016  . Generalized anxiety disorder 12/26/2016  . Major depressive disorder, recurrent episode, moderate (HCC) 12/26/2016  . Irritable bowel syndrome 01/27/2017  . Lumbago 07/04/2014  . Chronic pain syndrome 03/26/2015  . Uncomplicated opioid dependence (HCC) 05/26/2017  . Poor venous access 01/07/2019  . Insomnia 06/05/2019  . History of left salpingo-oophorectomy 03/26/2015  . Intractable nausea and vomiting 01/09/2019  . Pain of right  sacroiliac joint 07/17/2020   Resolved Ambulatory Problems    Diagnosis Date Noted  . Left knee pain 12/24/2010  . Ovarian cyst 01/08/2015  . Status post laparoscopy 01/08/2015  . Endometriosis 06/13/2014  . Anxiety 11/14/2016  . Depression, major, single episode, moderate (HCC) 11/14/2016  . Common migraine with intractable migraine 12/16/2016  . Separation anxiety 12/26/2016  . Panic disorder 12/26/2016   Past Medical History:  Diagnosis Date  . Frequency of urination   . Migraine   . Pelvic pain in female   . Polycystic ovary disease       Observations/Objective: No acute distress No labored breathing Active productive cough Pale appearance  .Marland Kitchen Today's Vitals   03/01/21 0817  Temp: 99.3 F (37.4 C)  TempSrc: Oral  Weight: 129 lb (58.5 kg)  Height: 5\' 2"  (1.575 m)   Body mass index is 23.59 kg/m.    Assessment and Plan: Marland KitchenAmber was seen today for cough.  Diagnoses and all orders for this visit:  Sinobronchitis -     azithromycin (ZITHROMAX Z-PAK) 250 MG tablet; Take 2 tablets (500 mg) on  Day 1,  followed by 1 tablet (250 mg) once daily on Days 2 through 5. -     methylPREDNISolone (MEDROL DOSEPAK) 4 MG TBPK tablet; Take as directed by package insert. -     chlorpheniramine-HYDROcodone (TUSSIONEX) 10-8 MG/5ML SUER; Take 5 mLs by mouth every 12 (twelve) hours as needed.  negative for covid times 2.  She does have positive covid exposure.  No vaccine.  Day 6 of symptoms.  Zpak, medrol dose pack and tussionex given.  Discussed symptomatic care.  Continue to use albuterol inhaler every 2-4 hours as needed for symptomatic relief.  Written out of work to go back Wednesday.     Follow Up Instructions:    I discussed the assessment and treatment plan with the patient. The patient was provided an opportunity to ask questions and all were answered. The patient agreed with the plan and demonstrated an understanding of the instructions.   The patient was  advised to call back or seek an in-person evaluation if the symptoms worsen or if the condition fails to improve as anticipated.   Tandy Gaw, PA-C

## 2021-03-02 ENCOUNTER — Encounter: Payer: Self-pay | Admitting: Physician Assistant

## 2021-03-02 ENCOUNTER — Other Ambulatory Visit (HOSPITAL_BASED_OUTPATIENT_CLINIC_OR_DEPARTMENT_OTHER): Payer: Self-pay

## 2021-03-02 MED ORDER — ONDANSETRON 8 MG PO TBDP
8.0000 mg | ORAL_TABLET | Freq: Three times a day (TID) | ORAL | 0 refills | Status: DC | PRN
  Filled 2021-03-02 – 2021-06-16 (×2): qty 20, 7d supply, fill #0

## 2021-03-03 ENCOUNTER — Other Ambulatory Visit (HOSPITAL_BASED_OUTPATIENT_CLINIC_OR_DEPARTMENT_OTHER): Payer: Self-pay

## 2021-03-03 MED ORDER — DEXTROMETHORPHAN HBR 15 MG/5ML PO SYRP
10.0000 mL | ORAL_SOLUTION | Freq: Four times a day (QID) | ORAL | 0 refills | Status: DC | PRN
  Filled 2021-03-03: qty 120, 3d supply, fill #0

## 2021-03-03 NOTE — Telephone Encounter (Signed)
Amanda Vazquez from [place] - patient has a controlled medication caution, should not be given controlled medication (since she is asking for codeine, Joy wanted to make sure you were alerted)

## 2021-03-03 NOTE — Addendum Note (Signed)
Addended by: Jomarie Longs on: 03/03/2021 02:53 PM   Modules accepted: Orders

## 2021-03-04 ENCOUNTER — Other Ambulatory Visit (HOSPITAL_BASED_OUTPATIENT_CLINIC_OR_DEPARTMENT_OTHER): Payer: Self-pay

## 2021-03-08 ENCOUNTER — Telehealth (INDEPENDENT_AMBULATORY_CARE_PROVIDER_SITE_OTHER): Payer: Self-pay | Admitting: Medical-Surgical

## 2021-03-08 ENCOUNTER — Encounter: Payer: Self-pay | Admitting: Medical-Surgical

## 2021-03-08 DIAGNOSIS — J40 Bronchitis, not specified as acute or chronic: Secondary | ICD-10-CM

## 2021-03-08 DIAGNOSIS — J329 Chronic sinusitis, unspecified: Secondary | ICD-10-CM

## 2021-03-08 MED ORDER — CEFDINIR 300 MG PO CAPS: 300.0000 mg | ORAL_CAPSULE | Freq: Two times a day (BID) | ORAL | 0 refills | Status: DC

## 2021-03-08 MED ORDER — BENZONATATE 200 MG PO CAPS: 200.0000 mg | ORAL_CAPSULE | Freq: Three times a day (TID) | ORAL | 0 refills | Status: DC | PRN

## 2021-03-08 MED ORDER — CHLORPHENIRAMINE MALEATE 4 MG PO TABS: 4.0000 mg | ORAL_TABLET | Freq: Two times a day (BID) | ORAL | 0 refills | Status: DC | PRN

## 2021-03-08 MED ORDER — PROMETHAZINE HCL 25 MG PO TABS: 25.0000 mg | ORAL_TABLET | Freq: Three times a day (TID) | ORAL | 0 refills | Status: DC | PRN

## 2021-03-08 NOTE — Progress Notes (Signed)
Virtual Visit via Video Note  I connected with [employer] on 03/08/21 at  9:50 AM EDT by a video enabled telemedicine application and verified that I am speaking with the correct person using two identifiers.   I discussed the limitations of evaluation and management by telemedicine and the availability of in person appointments. The patient expressed understanding and agreed to proceed.  Patient location: home Provider locations: office  Subjective:    CC: Continued cough  HPI: Pleasant 30 year old female presenting via MyChart video visit with reports of continued cough productive of thick green mucus.  She has been sick for a total of 3 weeks.  She saw Tandy Gaw, PA in a virtual visit on 6/6.  At that time she was prescribed azithromycin, Medrol Dosepak, albuterol, and Tussionex.  She has completed these medications as prescribed and notes that her symptoms did improve somewhat.  Unfortunately over the last few days, her symptoms have worsened again and now she is unable to get any sleep because of her coughing.  She notes that she is very fatigued and she has some chest heaviness.  Unable to come to our office for in person evaluation or x-ray as she is currently in [facility].  No fevers, chills, chest pain, or GI symptoms.  She has tested for COVID twice, both with negative results.  Past medical history, Surgical history, Family history not pertinant except as noted below, Social history, Allergies, and medications have been entered into the medical record, reviewed, and corrections made.   Review of Systems: See HPI for pertinent positives and negatives.   Objective:    General: Speaking clearly in complete sentences without any shortness of breath.  Alert and oriented x3.  Normal judgment. No apparent acute distress.  Impression and Recommendations:    1. Sinobronchitis Symptoms not excessively responsive to azithromycin so switching her over to [facility] twice  daily x10 days.  Sending in [place] to help with her cough.  Because of her history with narcotics, avoiding any cough syrups that contain hydrocodone or codeine.  Recommend using dextromethorphan as instructed but can also try Chlor-Trimeton (OTC).  Consider trying dextromethorphan with a dose of promethazine (similar to Promethazine DM) at bedtime to help with sleep.  I discussed the assessment and treatment plan with the patient. The patient was provided an opportunity to ask questions and all were answered. The patient agreed with the plan and demonstrated an understanding of the instructions.   The patient was advised to call back or seek an in-person evaluation if the symptoms worsen or if the condition fails to improve as anticipated.  20 minutes of non-face-to-face time was provided during this encounter.  Return if symptoms worsen or fail to improve.  Thayer Ohm, DNP, APRN, FNP-BC [facility] and [facility]

## 2021-03-09 ENCOUNTER — Encounter: Payer: Self-pay | Admitting: Medical-Surgical

## 2021-03-22 ENCOUNTER — Other Ambulatory Visit: Payer: Self-pay | Admitting: Medical-Surgical

## 2021-03-22 DIAGNOSIS — F411 Generalized anxiety disorder: Secondary | ICD-10-CM

## 2021-03-22 MED ORDER — ALPRAZOLAM 0.25 MG PO TABS
0.2500 mg | ORAL_TABLET | Freq: Every day | ORAL | 0 refills | Status: DC | PRN
Start: 2021-03-22 — End: 2022-01-24

## 2021-05-24 ENCOUNTER — Encounter: Payer: Self-pay | Admitting: Family Medicine

## 2021-05-24 ENCOUNTER — Telehealth (INDEPENDENT_AMBULATORY_CARE_PROVIDER_SITE_OTHER): Payer: Self-pay | Admitting: Family Medicine

## 2021-05-24 ENCOUNTER — Encounter: Payer: Self-pay | Admitting: Medical-Surgical

## 2021-05-24 ENCOUNTER — Other Ambulatory Visit: Payer: Self-pay | Admitting: Medical-Surgical

## 2021-05-24 DIAGNOSIS — J40 Bronchitis, not specified as acute or chronic: Secondary | ICD-10-CM

## 2021-05-24 DIAGNOSIS — J329 Chronic sinusitis, unspecified: Secondary | ICD-10-CM

## 2021-05-24 DIAGNOSIS — R110 Nausea: Secondary | ICD-10-CM

## 2021-05-24 DIAGNOSIS — F411 Generalized anxiety disorder: Secondary | ICD-10-CM

## 2021-05-24 MED ORDER — AMOXICILLIN-POT CLAVULANATE 875-125 MG PO TABS: 1.0000 | ORAL_TABLET | Freq: Two times a day (BID) | ORAL | 0 refills | Status: AC

## 2021-05-24 MED ORDER — PROMETHAZINE HCL 25 MG PO TABS: 25.0000 mg | ORAL_TABLET | Freq: Three times a day (TID) | ORAL | 0 refills | Status: DC | PRN

## 2021-05-24 MED ORDER — PREDNISONE 20 MG PO TABS: 40.0000 mg | ORAL_TABLET | Freq: Every day | ORAL | 0 refills | Status: AC

## 2021-05-24 NOTE — Progress Notes (Signed)
Virtual Video Visit via MyChart Note  I connected with  [employer] on 05/24/21 at  9:10 AM EDT by the video enabled telemedicine application for MyChart, and verified that I am speaking with the correct person using two identifiers.   I introduced myself as a Publishing rights manager with the practice. We discussed the limitations of evaluation and management by telemedicine and the availability of in person appointments. The patient expressed understanding and agreed to proceed.  Participating parties in this visit include: The patient and the nurse practitioner listed.  The patient is: At home I am: In the office - Primary Care [facility]ryne Sharper  Subjective:    CC:  Chief Complaint  Patient presents with   Cough   Sinusitis    HPI: Amanda Vazquez is a 30 y.o. year old female presenting today via MyChart today for URI, sinusitis, nausea.  Patient states her stepdaughter came home about 2 weeks ago with bad cold and now patient and her husband are sick. They have both had multiple negative COVID tests and husband also tested negative for strep and flu.   Patient started feeling bad about 9 days ago. Reports she continues to have dry coughing, body aches, headache, chest tightness, wheezing, fever/chills, rhinorrhea, 7/10 maxillary sinus pressure, nausea. She reports decreased smell/taste and poor appetite. She reports childhood asthma, and for the past week she has had to use her albuterol inhaler at least twice per day. She has tried OTC cough/cold medicines but nothing is helping. She is requesting cough syrup with codeine as that has helped in the past.   She denies and vomiting, diarrhea, chest pain with deep breaths, blood in urine/stool/sputum, lethargy, AMS, GI/GU changes.    Past medical history, Surgical history, Family history not pertinant except as noted below, Social history, Allergies, and medications have been entered into the medical record, reviewed, and corrections made.    Review of Systems:  All review of systems negative except what is listed in the HPI   Objective:    General:  Speaking clearly in complete sentences. Absent shortness of breath noted.   Alert and oriented x3.   Normal judgment.  Absent acute distress.   Impression and Recommendations:   1. Nausea Requesting something for nausea. She has been on phenergan in the past. Will give 10 tablets. Encouraged supportive measures.  - promethazine (PHENERGAN) 25 MG tablet; Take 1 tablet (25 mg total) by mouth every 8 (eight) hours as needed for nausea or vomiting.  Dispense: 10 tablet; Refill: 0  2. Sinobronchitis Will go ahead and treat with prednisone burst given respiratory symptoms of wheezing and SOB/DOE. Augmentin prescribed, recommend she give another day or two of conservative measures before starting. Not giving any narcotics after reviewing chart. Offered tessalon pearls but patient states they are too expensive. Recommend she continue with OTC options and other supportive measures- rest, hydration, humidifier use, warm compresses, warm liquids with honey/lemon, antihistamine, Flonase. Encouraged her to consider allergist or ENT referral if she continues with frequent sinus infections.   - amoxicillin-clavulanate (AUGMENTIN) 875-125 MG tablet; Take 1 tablet by mouth 2 (two) times daily for 7 days.  Dispense: 14 tablet; Refill: 0 - predniSONE (DELTASONE) 20 MG tablet; Take 2 tablets (40 mg total) by mouth daily with breakfast for 5 days.  Dispense: 10 tablet; Refill: 0     Follow-up if symptoms worsen or fail to improve.    I discussed the assessment and treatment plan with the patient. The patient was provided an opportunity  to ask questions and all were answered. The patient agreed with the plan and demonstrated an understanding of the instructions.   The patient was advised to call back or seek an in-person evaluation if the symptoms worsen or if the condition fails to improve as  anticipated.  I spent 20 minutes dedicated to the care of this patient on the date of this encounter to include pre-visit chart review of prior notes and results, face-to-face time with the patient, and post-visit ordering of testing as indicated.   Lollie Marrow Reola Calkins, DNP, FNP-C

## 2021-05-24 NOTE — Patient Instructions (Signed)
Over the counter medications that may be helpful for symptoms:  Guaifenesin 1200 mg extended release tabs twice daily, with plenty of water For cough and congestion Brand name: Mucinex   Pseudoephedrine 30 mg, one or two tabs every 4 to 6 hours For sinus congestion Brand name: Sudafed You must get this from the pharmacy counter.  Oxymetazoline nasal spray each morning, one spray in each nostril, for NO MORE THAN 3 days  For nasal and sinus congestion Brand name: Afrin Saline nasal spray or Saline Nasal Irrigation 3-5 times a day For nasal and sinus congestion Brand names: Ocean or AYR Fluticasone nasal spray, one spray in each nostril, each morning (after oxymetazoline and saline, if used) For nasal and sinus congestion Brand name: Flonase Warm salt water gargles  For sore throat Every few hours as needed Alternate ibuprofen 400-600 mg and acetaminophen 1000 mg every 4-6 hours For fever, body aches, headache Brand names: Motrin or Advil and Tylenol Dextromethorphan 12-hour cough version 30 mg every 12 hours  For cough Brand name: Delsym Stop all other cold medications for now (Nyquil, Dayquil, Tylenol Cold, Theraflu, etc) and other non-prescription cough/cold preparations. Many of these have the same ingredients listed above and could cause an overdose of medication.   Herbal treatments that have been shown to be helpful in some patients include: Vitamin C 1000mg per day Vitamin D 4000iU per day Zinc 100mg per day Quercetin 25-500mg twice a day Melatonin 5-10mg at bedtime  General Instructions Allow your body to rest Drink PLENTY of fluids Isolate yourself from everyone, even family, until test results have returned    If you develop severe shortness of breath, uncontrolled fevers, coughing up blood, confusion, chest pain, or signs of dehydration (such as significantly decreased urine amounts or dizziness with standing) please go to the ER.  

## 2021-06-16 ENCOUNTER — Other Ambulatory Visit (HOSPITAL_BASED_OUTPATIENT_CLINIC_OR_DEPARTMENT_OTHER): Payer: Self-pay

## 2021-06-17 ENCOUNTER — Other Ambulatory Visit (HOSPITAL_BASED_OUTPATIENT_CLINIC_OR_DEPARTMENT_OTHER): Payer: Self-pay

## 2021-06-28 ENCOUNTER — Other Ambulatory Visit (HOSPITAL_BASED_OUTPATIENT_CLINIC_OR_DEPARTMENT_OTHER): Payer: Self-pay

## 2021-07-13 ENCOUNTER — Encounter (HOSPITAL_BASED_OUTPATIENT_CLINIC_OR_DEPARTMENT_OTHER): Payer: Self-pay | Admitting: Emergency Medicine

## 2021-07-13 ENCOUNTER — Emergency Department (HOSPITAL_BASED_OUTPATIENT_CLINIC_OR_DEPARTMENT_OTHER): Payer: Self-pay

## 2021-07-13 ENCOUNTER — Other Ambulatory Visit: Payer: Self-pay

## 2021-07-13 ENCOUNTER — Emergency Department (HOSPITAL_BASED_OUTPATIENT_CLINIC_OR_DEPARTMENT_OTHER)
Admission: EM | Admit: 2021-07-13 | Discharge: 2021-07-13 | Disposition: A | Discharge status: Home / Self Care | Payer: Self-pay | Attending: Emergency Medicine | Admitting: Emergency Medicine

## 2021-07-13 DIAGNOSIS — R110 Nausea: Secondary | ICD-10-CM | POA: Insufficient documentation

## 2021-07-13 DIAGNOSIS — R1032 Left lower quadrant pain: Secondary | ICD-10-CM | POA: Insufficient documentation

## 2021-07-13 LAB — CBC WITH DIFFERENTIAL/PLATELET
Abs Immature Granulocytes: 0.02 10*3/uL (ref 0.00–0.07)
Basophils Absolute: 0 10*3/uL (ref 0.0–0.1)
Basophils Relative: 0 %
Eosinophils Absolute: 0.1 10*3/uL (ref 0.0–0.5)
Eosinophils Relative: 1 %
HCT: 40.6 % (ref 36.0–46.0)
Hemoglobin: 14.2 g/dL (ref 12.0–15.0)
Immature Granulocytes: 0 %
Lymphocytes Relative: 25 %
Lymphs Abs: 1.8 10*3/uL (ref 0.7–4.0)
MCH: 32.3 pg (ref 26.0–34.0)
MCHC: 35 g/dL (ref 30.0–36.0)
MCV: 92.5 fL (ref 80.0–100.0)
Monocytes Absolute: 0.4 10*3/uL (ref 0.1–1.0)
Monocytes Relative: 5 %
Neutro Abs: 5.1 10*3/uL (ref 1.7–7.7)
Neutrophils Relative %: 69 %
Platelets: 319 10*3/uL (ref 150–400)
RBC: 4.39 MIL/uL (ref 3.87–5.11)
RDW: 13.1 % (ref 11.5–15.5)
WBC: 7.4 10*3/uL (ref 4.0–10.5)
nRBC: 0 % (ref 0.0–0.2)

## 2021-07-13 LAB — COMPREHENSIVE METABOLIC PANEL
ALT: 15 U/L (ref 0–44)
AST: 17 U/L (ref 15–41)
Albumin: 4.6 g/dL (ref 3.5–5.0)
Alkaline Phosphatase: 49 U/L (ref 38–126)
Anion gap: 8 (ref 5–15)
BUN: 7 mg/dL (ref 6–20)
CO2: 23 mmol/L (ref 22–32)
Calcium: 9.3 mg/dL (ref 8.9–10.3)
Chloride: 107 mmol/L (ref 98–111)
Creatinine, Ser: 0.71 mg/dL (ref 0.44–1.00)
GFR, Estimated: >60 mL/min (ref >60)
Glucose, Bld: 92 mg/dL (ref 70–99)
Potassium: 4.1 mmol/L (ref 3.5–5.1)
Sodium: 138 mmol/L (ref 135–145)
Total Bilirubin: 0.3 mg/dL (ref 0.3–1.2)
Total Protein: 7.2 g/dL (ref 6.5–8.1)

## 2021-07-13 LAB — URINALYSIS, ROUTINE W REFLEX MICROSCOPIC
Bilirubin Urine: NEGATIVE
Glucose, UA: NEGATIVE mg/dL
Hgb urine dipstick: NEGATIVE
Ketones, ur: NEGATIVE mg/dL
Leukocytes,Ua: NEGATIVE
Nitrite: NEGATIVE
Protein, ur: NEGATIVE mg/dL
Specific Gravity, Urine: 1.02 (ref 1.005–1.030)
pH: 7 (ref 5.0–8.0)

## 2021-07-13 MED ORDER — SODIUM CHLORIDE 0.9 % IV BOLUS
500.0000 mL | Freq: Once | INTRAVENOUS | Status: AC
  Administered 2021-07-13: 500 mL via INTRAVENOUS

## 2021-07-13 MED ORDER — NAPROXEN 375 MG PO TABS: 375.0000 mg | ORAL_TABLET | Freq: Two times a day (BID) | ORAL | 0 refills | Status: DC

## 2021-07-13 MED ORDER — IOHEXOL 300 MG/ML  SOLN
100.0000 mL | Freq: Once | INTRAMUSCULAR | Status: AC | PRN
  Administered 2021-07-13: 100 mL via INTRAVENOUS

## 2021-07-13 MED ORDER — KETOROLAC TROMETHAMINE 30 MG/ML IJ SOLN
30.0000 mg | Freq: Once | INTRAMUSCULAR | Status: AC
  Administered 2021-07-13: 30 mg via INTRAVENOUS
  Filled 2021-07-13: qty 1

## 2021-07-13 NOTE — ED Notes (Signed)
Unsuccessful IV attempt, left lateral AC. Blood collected, sent to lab. Spoke with Danella Deis in lab to try to process

## 2021-07-13 NOTE — ED Triage Notes (Addendum)
Pt reports left lower abd pain that radiates to left breast since Friday night. Pt reports intermittent nausea, LBM this am, hysterectomy 2017.

## 2021-07-13 NOTE — Discharge Instructions (Addendum)
You have been seen and discharged from the emergency department.  Your blood work and CAT scan were normal today.  I would follow-up with gastroenterology for further evaluation.  Take pain medicine as directed.  Follow-up with your primary provider for reevaluation and further care. Take home medications as prescribed. If you have any worsening symptoms or further concerns for your health please return to an emergency department for further evaluation.

## 2021-07-13 NOTE — ED Provider Notes (Signed)
[facility] EMERGENCY DEPARTMENT Provider Note   CSN: [id number] Arrival date & time: 07/13/21  0750     History Chief Complaint  Patient presents with   Abdominal Pain    Amanda Vazquez is a 30 y.o. female.  HPI  30 year old female with past medical history of total hysterectomy secondary to endometriosis, migraines presents to the emergency department with abdominal pain.  Patient states that this started approximately 4 days ago, and has been becoming worse in severity.  Associated with nausea.  She describes the pain as a sharp pain that originates in the left lower quadrant and spreads up the left side of her abdomen.  Had a normal bowel movement this morning.  Denies any vaginal complaints.  Denies fever/chills.  Has been taking Tylenol without improvement.  Past Medical History:  Diagnosis Date   Common migraine with intractable migraine 12/16/2016   Endometriosis    Frequency of urination    Migraine    Pelvic pain in female    Polycystic ovary disease     Patient Active Problem List   Diagnosis Date Noted   Pain of right sacroiliac joint 07/17/2020   Insomnia 06/05/2019   Intractable nausea and vomiting 01/09/2019   Poor venous access 01/07/2019   Uncomplicated opioid dependence (HCC) 05/26/2017   Irritable bowel syndrome 01/27/2017   Generalized anxiety disorder 12/26/2016   Major depressive disorder, recurrent episode, moderate (HCC) 12/26/2016   Surgical menopause 11/14/2016   Dyspareunia, female 11/14/2016   Headache disorder 11/14/2016   Constipation 11/14/2016   Chronic pelvic pain in female 03/26/2015   Chronic pain syndrome 03/26/2015   History of left salpingo-oophorectomy 03/26/2015   Lumbago 07/04/2014    Past Surgical History:  Procedure Laterality Date   ABDOMINAL HYSTERECTOMY     DX LAPAROSCOPY W/ LASER ABLATION OF ENDOMETRIOSIS  06-12-2014   [facility]   LAPAROSCOPIC OVARIAN CYSTECTOMY Left  01/08/2015   Procedure: LAPAROSCOPIC OVARIAN CYSTECTOMY;  Surgeon: Richardean Chimera, MD;  Location: [facility];  Service: Gynecology;  Laterality: Left;   LAPAROSCOPY N/A 01/08/2015   Procedure: LAPAROSCOPY DIAGNOSTIC;  Surgeon: Richardean Chimera, MD;  Location: [facility];  Service: Gynecology;  Laterality: N/A;   TONSILLECTOMY  age 30     OB History     Gravida  1   Para      Term      Preterm      AB  1   Living         SAB  1   IAB      Ectopic      Multiple      Live Births              Family History  Problem Relation Age of Onset   Diabetes Sister    Diabetes Maternal Grandfather    Hypertension Maternal Grandfather    Cancer Maternal Grandfather    Hypertension Paternal Grandmother    Alcohol abuse Mother    Cancer Maternal Grandmother    Cancer Paternal Grandfather    Heart attack Neg Hx     Social History   Tobacco Use   Smoking status: Never   Smokeless tobacco: Never  Substance Use Topics   Alcohol use: Yes    Comment: occasional   Drug use: No    Home Medications Prior to Admission medications   Medication Sig Start Date End Date Taking? Authorizing Provider  acetaminophen (TYLENOL) 500 MG tablet Take 500  mg by mouth every 6 (six) hours as needed.   Yes [provider]  diphenhydrAMINE (BENADRYL) 25 MG tablet Take 25 mg by mouth every 6 (six) hours as needed.   Yes [provider]  estradiol (ESTRACE) 1 MG tablet Take 1 mg by mouth daily.   Yes [provider]  ibuprofen (ADVIL) 800 MG tablet Take 1 tablet (800 mg total) by mouth 3 (three) times daily. 07/14/20  Yes Arby Barrette, MD  ondansetron (ZOFRAN-ODT) 8 MG disintegrating tablet Take 1 tablet (8 mg total) by mouth every 8 (eight) hours as needed for nausea. 03/02/21  Yes Breeback, Jade L, PA-C  albuterol (VENTOLIN HFA) 108 (90 Base) MCG/ACT inhaler Inhale 1-2 puffs into the lungs every 6 (six) hours as needed for wheezing or  shortness of breath. 03/02/20   [name], NP  ALPRAZolam Prudy Feeler) 0.25 MG tablet Take 1 tablet (0.25 mg total) by mouth daily as needed for anxiety. No further refills. 03/22/21   Christen Butter, NP  benzonatate (TESSALON) 200 MG capsule Take 1 capsule (200 mg total) by mouth 3 (three) times daily as needed for cough. 03/08/21   Christen Butter, NP  chlorpheniramine (CHLOR-TRIMETON) 4 MG tablet Take 1 tablet (4 mg total) by mouth 2 (two) times daily as needed. 03/08/21   Christen Butter, NP  cyclobenzaprine (FLEXERIL) 10 MG tablet Take 1 tablet (10 mg total) by mouth 2 (two) times daily as needed for muscle spasms. 10/03/20   Alvira Monday, MD  dextromethorphan 15 MG/5ML syrup Take 10 mLs (30 mg total) by mouth 4 (four) times daily as needed for cough. 03/03/21   Breeback, Jade L, PA-C  ipratropium (ATROVENT) 0.03 % nasal spray Place 2 sprays into both nostrils every 12 (twelve) hours. 03/16/20   Christen Butter, NP  promethazine (PHENERGAN) 25 MG tablet Take 1 tablet (25 mg total) by mouth every 8 (eight) hours as needed for nausea or vomiting. 05/24/21   Clayborne Dana, NP    Allergies    Buprenorphine  Review of Systems   Review of Systems  Constitutional:  Positive for chills and fatigue. Negative for fever.  HENT:  Negative for congestion.   Eyes:  Negative for visual disturbance.  Respiratory:  Negative for shortness of breath.   Cardiovascular:  Negative for chest pain.  Gastrointestinal:  Positive for abdominal pain and nausea. Negative for constipation, diarrhea and vomiting.  Genitourinary:  Negative for dysuria, hematuria, vaginal bleeding, vaginal discharge and vaginal pain.  Skin:  Negative for rash.  Neurological:  Negative for headaches.   Physical Exam Updated Vital Signs BP 115/72 (BP Location: Right Arm)   Pulse 72   Temp 98.2 F (36.8 C) (Oral)   Resp 18   Ht 5\' 2"  (1.575 m)   Wt 54.4 kg   LMP 12/11/2014 (Approximate)   SpO2 100%   BMI 21.95 kg/m   Physical Exam Vitals and  nursing note reviewed.  Constitutional:      Appearance: Normal appearance.  HENT:     Head: Normocephalic.     Mouth/Throat:     Mouth: Mucous membranes are moist.  Cardiovascular:     Rate and Rhythm: Normal rate.  Pulmonary:     Effort: Pulmonary effort is normal. No respiratory distress.  Abdominal:     General: Bowel sounds are normal.     Palpations: Abdomen is soft.     Tenderness: There is abdominal tenderness in the suprapubic area and left lower quadrant. There is guarding. There is  no rebound.  Skin:    General: Skin is warm.  Neurological:     Mental Status: She is alert and oriented to person, place, and time. Mental status is at baseline.  Psychiatric:        Mood and Affect: Mood normal.    ED Results / Procedures / Treatments   Labs (all labs ordered are listed, but only abnormal results are displayed) Labs Reviewed  COMPREHENSIVE METABOLIC PANEL  URINALYSIS, ROUTINE W REFLEX MICROSCOPIC  CBC    EKG None  Radiology No results found.  Procedures Procedures   Medications Ordered in ED Medications  sodium chloride 0.9 % bolus 500 mL (has no administration in time range)  ketorolac (TORADOL) 30 MG/ML injection 30 mg (has no administration in time range)    ED Course  I have reviewed the triage vital signs and the nursing notes.  Pertinent labs & imaging results that were available during my care of the patient were reviewed by me and considered in my medical decision making (see chart for details).    MDM Rules/Calculators/A&P                           30 year old female presents emergency department abdominal pain.  Patient had reproducible left-sided abdominal pain to palpation.  Blood work is reassuring, CAT scan shows no acute abnormality.  After symptomatic treatment she feels improved.  Plan for outpatient follow-up.  Patient at this time appears safe and stable for discharge and will be treated as an outpatient.  Discharge plan and strict  return to ED precautions discussed, patient verbalizes understanding and agreement.  Final Clinical Impression(s) / ED Diagnoses Final diagnoses:  None    Rx / DC Orders ED Discharge Orders     None        Rozelle Logan, DO 07/13/21 1426

## 2021-07-14 ENCOUNTER — Telehealth: Payer: Self-pay | Admitting: General Practice

## 2021-07-14 NOTE — Telephone Encounter (Signed)
Transition Care Management Unsuccessful Follow-up Telephone Call  Date of discharge and from where:  07/13/21 from [facility]  Attempts:  1st Attempt  Reason for unsuccessful TCM follow-up call:  Left voice message

## 2021-07-16 NOTE — Telephone Encounter (Signed)
Transition Care Management Unsuccessful Follow-up Telephone Call  Date of discharge and from where:  07/13/21 from [facility]  Attempts:  2nd Attempt  Reason for unsuccessful TCM follow-up call:  Left voice message

## 2021-07-19 NOTE — Telephone Encounter (Signed)
Transition Care Management Unsuccessful Follow-up Telephone Call  Date of discharge and from where:  07/13/21 from [facility]  Attempts:  3rd Attempt  Reason for unsuccessful TCM follow-up call:  Left voice message

## 2021-09-21 ENCOUNTER — Other Ambulatory Visit: Payer: Self-pay | Admitting: Medical-Surgical

## 2021-09-21 DIAGNOSIS — R110 Nausea: Secondary | ICD-10-CM

## 2022-01-24 ENCOUNTER — Ambulatory Visit (INDEPENDENT_AMBULATORY_CARE_PROVIDER_SITE_OTHER): Payer: Self-pay | Admitting: Medical-Surgical

## 2022-01-24 ENCOUNTER — Encounter: Payer: Self-pay | Admitting: Medical-Surgical

## 2022-01-24 VITALS — BP 108/77 | HR 99 | Resp 20 | Ht 62.0 in | Wt 108.9 lb

## 2022-01-24 DIAGNOSIS — F4321 Adjustment disorder with depressed mood: Secondary | ICD-10-CM

## 2022-01-24 MED ORDER — PROMETHAZINE HCL 25 MG PO TABS: 25.0000 mg | ORAL_TABLET | Freq: Three times a day (TID) | ORAL | 2 refills | Status: DC | PRN

## 2022-01-24 MED ORDER — HYDROXYZINE HCL 50 MG PO TABS: 50.0000 mg | ORAL_TABLET | Freq: Three times a day (TID) | ORAL | 3 refills | Status: DC | PRN

## 2022-01-24 NOTE — Progress Notes (Signed)
?  HPI with pertinent ROS:  ? ?CC: anxiety ? ?HPI: ?Pleasant 31 year old female presenting today to discuss severe anxiety and depression. She has been doing well overall but is now going through an unexpected separation. She does not have any family or friends nearby although she does have some that live in [place]. Notes that she will likely be relocating up there in the next couple of weeks. She is have intermittent palpitations, poor appetite, poor sleep, intermittent nausea, and frequent tearfulness. Has been using herbal teas and working on meditation. Unfortunately, these efforts are not helping right now. Has a history of trying multiple different medications without good benefit. Was on Xanax for a while and is aware that this became an addiction. Would like to avoid long-term and addictive medications if possible. Has had passive thoughts of being better off dead but has no plan and verbalizes that she knows that is not the way to handle things.  ?. ?I reviewed the past medical history, family history, social history, surgical history, and allergies today and no changes were needed.  Please see the problem list section below in epic for further details. ? ? ?Physical exam:  ? ?General: Well Developed, well nourished, and in no acute distress, tearful.  ?Neuro: Alert and oriented x3.  ?HEENT: Normocephalic, atraumatic.  ?Skin: Warm and dry. ?Cardiac: Regular rate and rhythm, no murmurs rubs or gallops, no lower extremity edema.  ?Respiratory: Clear to auscultation bilaterally. Not using accessory muscles, speaking in full sentences. ? ?Impression and Recommendations:   ? ?1. Grief reaction ?Starting Hydroxyzine 50mg  TID prn anxiety. Discussed possible trial of SSRI/SNRI but she would like to hold off on that since she hopes this will be a temporary issue.  ? ?Return if symptoms worsen or fail to improve. ?___________________________________________ ?Clearnce Sorrel, DNP, APRN, FNP-BC ?Primary Care and Sports  Medicine ?[place] ?

## 2022-02-17 ENCOUNTER — Encounter: Payer: Self-pay | Admitting: Medical-Surgical

## 2022-02-17 MED ORDER — HYDROXYZINE HCL 50 MG PO TABS: 50.0000 mg | ORAL_TABLET | Freq: Three times a day (TID) | ORAL | 0 refills | Status: DC | PRN

## 2022-02-17 NOTE — Telephone Encounter (Signed)
Please see the MyChart message reply(ies) for my assessment and plan.    This patient gave consent for this Medical Advice Message and is aware that it may result in a bill to [employer], as well as the possibility of receiving a bill for a co-payment or deductible. They are an established patient, but are not seeking medical advice exclusively about a problem treated during an in person or video visit in the last seven days. I did not recommend an in person or video visit within seven days of my reply.    I spent a total of 6 minutes cumulative time within 7 days through [employer].  ___________________________________________ Thayer Ohm, DNP, APRN, FNP-BC Primary Care and Sports Medicine [facility]

## 2022-05-23 ENCOUNTER — Encounter: Payer: Self-pay | Admitting: Family Medicine

## 2022-05-23 ENCOUNTER — Ambulatory Visit (INDEPENDENT_AMBULATORY_CARE_PROVIDER_SITE_OTHER): Payer: Self-pay | Admitting: Family Medicine

## 2022-05-23 ENCOUNTER — Ambulatory Visit (INDEPENDENT_AMBULATORY_CARE_PROVIDER_SITE_OTHER): Payer: Self-pay

## 2022-05-23 VITALS — BP 98/67 | HR 91 | Temp 99.1°F | Ht 62.0 in | Wt 110.0 lb

## 2022-05-23 DIAGNOSIS — R059 Cough, unspecified: Secondary | ICD-10-CM

## 2022-05-23 DIAGNOSIS — J40 Bronchitis, not specified as acute or chronic: Secondary | ICD-10-CM

## 2022-05-23 DIAGNOSIS — R110 Nausea: Secondary | ICD-10-CM

## 2022-05-23 DIAGNOSIS — J069 Acute upper respiratory infection, unspecified: Secondary | ICD-10-CM

## 2022-05-23 DIAGNOSIS — J329 Chronic sinusitis, unspecified: Secondary | ICD-10-CM

## 2022-05-23 DIAGNOSIS — R051 Acute cough: Secondary | ICD-10-CM | POA: Insufficient documentation

## 2022-05-23 LAB — POCT INFLUENZA A/B
Influenza A, POC: NEGATIVE
Influenza B, POC: NEGATIVE

## 2022-05-23 LAB — POCT RAPID STREP A (OFFICE): Rapid Strep A Screen: NEGATIVE

## 2022-05-23 LAB — POC COVID19 BINAXNOW: SARS Coronavirus 2 Ag: NEGATIVE

## 2022-05-23 MED ORDER — ALBUTEROL SULFATE HFA 108 (90 BASE) MCG/ACT IN AERS: 1.0000 | INHALATION_SPRAY | Freq: Four times a day (QID) | RESPIRATORY_TRACT | 1 refills | Status: AC | PRN

## 2022-05-23 MED ORDER — DOXYCYCLINE HYCLATE 100 MG PO TABS: 100.0000 mg | ORAL_TABLET | Freq: Two times a day (BID) | ORAL | 0 refills | Status: AC

## 2022-05-23 MED ORDER — PROMETHAZINE HCL 12.5 MG PO TABS: 12.5000 mg | ORAL_TABLET | Freq: Three times a day (TID) | ORAL | 0 refills | Status: DC | PRN

## 2022-05-23 MED ORDER — GUAIFENESIN-CODEINE 100-10 MG/5ML PO SOLN: 5.0000 mL | Freq: Three times a day (TID) | ORAL | 0 refills | Status: DC | PRN

## 2022-05-23 MED ORDER — BENZONATATE 100 MG PO CAPS: 100.0000 mg | ORAL_CAPSULE | Freq: Three times a day (TID) | ORAL | 0 refills | Status: AC | PRN

## 2022-05-23 NOTE — Assessment & Plan Note (Addendum)
-   interpreted tests myself: negative flu, negative rapid covid, negative strep - have ordered covid pcr  - coarse breath sounds one exam. Did order cxr to rule out pneumonia since pt has had some associated fevers, chills, and shortness of breath with coughing.

## 2022-05-23 NOTE — Addendum Note (Signed)
Addended by: Howard Pouch on: 05/23/2022 05:19 PM   Modules accepted: Orders

## 2022-05-23 NOTE — Progress Notes (Signed)
Acute Office Visit  Subjective:     Patient ID: Amanda Vazquez, female    DOB: 03-Mar-1991, 31 y.o.   MRN: [id number]  Chief Complaint  Patient presents with   Chills   Cough   Fever    Cough Associated symptoms include a fever.  Fever  Associated symptoms include coughing.   Patient is in today for fever, chills, cough. She says her chest feels heavy. She recently returned from a trip in [place] where she was in contact with sick family members. Has body aches, watery eyes. She admits to sinus pain with headaches. Admits to some nausea. Has had a decreased appetite. Has been having trouble keeping liquids down. Notes pain to swallow. Did tylenol, cold, and flu.   Review of Systems  Constitutional:  Positive for fever.  Respiratory:  Positive for cough.         Objective:    BP 98/67   Pulse 91   Temp 99.1 F (37.3 C)   Ht 5\' 2"  (1.575 m)   Wt 110 lb (49.9 kg)   LMP 12/11/2014 (Approximate)   SpO2 97%   BMI 20.12 kg/m    Physical Exam Vitals and nursing note reviewed.  Constitutional:      General: She is not in acute distress.    Appearance: Normal appearance.  HENT:     Head: Normocephalic and atraumatic.     Right Ear: External ear normal.     Left Ear: External ear normal.     Nose: Nose normal.  Eyes:     Conjunctiva/sclera: Conjunctivae normal.  Cardiovascular:     Rate and Rhythm: Normal rate and regular rhythm.  Pulmonary:     Comments: Coughing throughout exam. Coarse lung sounds.  Skin:    General: Skin is warm.  Neurological:     General: No focal deficit present.     Mental Status: She is alert and oriented to person, place, and time.  Psychiatric:        Mood and Affect: Mood normal.        Behavior: Behavior normal.        Thought Content: Thought content normal.        Judgment: Judgment normal.     Results for orders placed or performed in visit on 05/23/22  POC COVID-19  Result Value Ref Range   SARS Coronavirus 2 Ag  Negative Negative  POCT rapid strep A  Result Value Ref Range   Rapid Strep A Screen Negative Negative  POCT Influenza A/B  Result Value Ref Range   Influenza A, POC Negative Negative   Influenza B, POC Negative Negative        Assessment & Plan:   Problem List Items Addressed This Visit       Respiratory   Sinobronchitis    - given tesslon perles  - given inhaler for wheezing reported by patient earlier in the day - cough syrup for dry cough  - doxycycline given for acute sinobronchitis  - CXR ordered to rule out pna since coarse breath sounds heard on exam - pt coughing constantly in the room and looks to be in mild distress. Did feel warm on exam even thought vitals show no fever on arrival.  - follow up in one week if no better  - discussed if symptoms change/get worse/fail to improve to return and we can repeat testing  - counseled pt on proper usage of all medications given and not to take phenergan and cough  syrup together      Relevant Medications   benzonatate (TESSALON) 100 MG capsule   guaiFENesin-codeine 100-10 MG/5ML syrup   promethazine (PHENERGAN) 12.5 MG tablet   doxycycline (VIBRA-TABS) 100 MG tablet   Other Relevant Orders   DG Chest 2 View (Completed)     Other   Acute cough    - interpreted tests myself: negative flu, negative rapid covid, negative strep - have ordered covid pcr  - coarse breath sounds one exam. Did order cxr to rule out pneumonia since pt has had some associated fevers, chills, and shortness of breath with coughing.         Relevant Medications   benzonatate (TESSALON) 100 MG capsule   guaiFENesin-codeine 100-10 MG/5ML syrup   Other Relevant Orders   POC COVID-19 (Completed)   Novel Coronavirus, NAA ([facility])   POCT rapid strep A (Completed)   POCT Influenza A/B (Completed)   Nausea    - have ordered phenergan tablets for patient. Likely believe the cough is making her nauseous but she needs to increase appetite and fluid  intake. Hopeful the phenergan will allow her to do this. Discussed not to take this with the cough medicine. Pt verbalized understanding      Relevant Medications   promethazine (PHENERGAN) 12.5 MG tablet   Other Visit Diagnoses     Upper respiratory infection, acute    -  Primary   Relevant Medications   albuterol (VENTOLIN HFA) 108 (90 Base) MCG/ACT inhaler       Meds ordered this encounter  Medications   benzonatate (TESSALON) 100 MG capsule    Sig: Take 1 capsule (100 mg total) by mouth 3 (three) times daily as needed for up to 13 days for cough.    Dispense:  40 capsule    Refill:  0   albuterol (VENTOLIN HFA) 108 (90 Base) MCG/ACT inhaler    Sig: Inhale 1-2 puffs into the lungs every 6 (six) hours as needed for wheezing or shortness of breath.    Dispense:  8 g    Refill:  1   guaiFENesin-codeine 100-10 MG/5ML syrup    Sig: Take 5 mLs by mouth 3 (three) times daily as needed for cough.    Dispense:  240 mL    Refill:  0   promethazine (PHENERGAN) 12.5 MG tablet    Sig: Take 1 tablet (12.5 mg total) by mouth every 8 (eight) hours as needed for nausea or vomiting.    Dispense:  20 tablet    Refill:  0   doxycycline (VIBRA-TABS) 100 MG tablet    Sig: Take 1 tablet (100 mg total) by mouth 2 (two) times daily for 7 days.    Dispense:  14 tablet    Refill:  0    No follow-ups on file.  Charlton Amor, DO

## 2022-05-23 NOTE — Assessment & Plan Note (Deleted)
-   given tesslon perles  - given inhaler for wheezing reported by patient earlier in the day - cough syrup for dry cough  - doxycycline given for acute bronchitis  - CXR ordered to rule out pna since coarse breath sounds heard on exam - pt coughing constantly in the room and looks to be in mild distress. Did feel warm on exam even thought vitals show no fever on arrival.  - follow up in one week if no better  - discussed if symptoms change/get worse/fail to improve to return and we can repeat testing  - counseled pt on proper usage of all medications given and not to take phenergan and cough syrup together

## 2022-05-24 DIAGNOSIS — R110 Nausea: Secondary | ICD-10-CM | POA: Insufficient documentation

## 2022-05-24 DIAGNOSIS — J329 Chronic sinusitis, unspecified: Secondary | ICD-10-CM | POA: Insufficient documentation

## 2022-05-24 NOTE — Assessment & Plan Note (Signed)
-   have ordered phenergan tablets for patient. Likely believe the cough is making her nauseous but she needs to increase appetite and fluid intake. Hopeful the phenergan will allow her to do this. Discussed not to take this with the cough medicine. Pt verbalized understanding

## 2022-05-24 NOTE — Assessment & Plan Note (Addendum)
-   given tesslon perles  - given inhaler for wheezing reported by patient earlier in the day - cough syrup for dry cough  - doxycycline given for acute sinobronchitis  - CXR ordered to rule out pna since coarse breath sounds heard on exam - pt coughing constantly in the room and looks to be in mild distress. Did feel warm on exam even thought vitals show no fever on arrival.  - follow up in one week if no better  - discussed if symptoms change/get worse/fail to improve to return and we can repeat testing  - counseled pt on proper usage of all medications given and not to take phenergan and cough syrup together

## 2022-05-24 NOTE — Addendum Note (Signed)
Addended by: Charlton Amor on: 05/24/2022 08:55 AM   Modules accepted: Level of Service

## 2022-05-25 LAB — SPECIMEN STATUS REPORT

## 2022-05-27 ENCOUNTER — Telehealth: Payer: Self-pay | Admitting: Family Medicine

## 2022-07-05 ENCOUNTER — Encounter: Payer: Self-pay | Admitting: Family Medicine

## 2022-07-05 ENCOUNTER — Telehealth (INDEPENDENT_AMBULATORY_CARE_PROVIDER_SITE_OTHER): Payer: Self-pay | Admitting: Family Medicine

## 2022-07-05 DIAGNOSIS — J329 Chronic sinusitis, unspecified: Secondary | ICD-10-CM

## 2022-07-05 DIAGNOSIS — J40 Bronchitis, not specified as acute or chronic: Secondary | ICD-10-CM

## 2022-07-05 MED ORDER — BENZONATATE 200 MG PO CAPS: 200.0000 mg | ORAL_CAPSULE | Freq: Three times a day (TID) | ORAL | 0 refills | Status: DC | PRN

## 2022-07-05 MED ORDER — PREDNISONE 50 MG PO TABS: ORAL_TABLET | ORAL | 0 refills | Status: DC

## 2022-07-05 NOTE — Progress Notes (Signed)
Symptoms x 2 days  Non-productive painful cough. Feels like elephant on chest. Chills. Body aches. Headache. Nasal congestion.   COVID negative: yesterday  Medications: Tylenol, Motrin. Had a few Tessalon perles. They helped but didn't last long.

## 2022-07-05 NOTE — Assessment & Plan Note (Signed)
Discussed with her that symptoms seem viral in nature.  Supportive care with rest and increased fluids recommended.  She may continue OTC medications for fever, aches, cough.  She is requesting cough syrup with codeine, I declines to write this.. Recommended that she use delsym and I renewed tessalon perles.  Adding steroid burst as well.  Albuterol renewed.  Contact clinic if having worsening or non-resolving symptoms.

## 2022-07-05 NOTE — Progress Notes (Signed)
Amanda Vazquez - 31 y.o. female MRN [id number]  Date of birth: Nov 10, 1990   This visit type was conducted due to national recommendations for restrictions regarding the COVID-19 Pandemic (e.g. social distancing).  This format is felt to be most appropriate for this patient at this time.  All issues noted in this document were discussed and addressed.  No physical exam was performed (except for noted visual exam findings with Video Visits).  I discussed the limitations of evaluation and management by telemedicine and the availability of in person appointments. The patient expressed understanding and agreed to proceed.  I connected withNAME@ on 07/05/22 at 10:30 AM EDT by a video enabled telemedicine application and verified that I am speaking with the correct person using two identifiers.  Present at visit: Amanda Nutting, DO Amanda Vazquez   Patient Location: Home [address] [place] [address]   Provider location:   [facility]  Chief Complaint  Patient presents with   Cough    HPI  Amanda Vazquez is a 31 y.o. female who presents via audio/video conferencing for a telehealth visit today.  She reports having cough, congestion, chest tightness, wheezing, sinus pressure and body aches.  Low grade fever associated with this as well.  Symptoms started about 2 days ago.  COVID test at home is negative.  She denies difficulty breathing.  Using tylenol cold and flu.  Drinking decent amount of fluids.  No GI symptoms.    ROS:  A comprehensive ROS was completed and negative except as noted per HPI  Past Medical History:  Diagnosis Date   Common migraine with intractable migraine 12/16/2016   Endometriosis    Frequency of urination    Migraine    Pelvic pain in female    Polycystic ovary disease     Past Surgical History:  Procedure Laterality Date   ABDOMINAL HYSTERECTOMY     DX LAPAROSCOPY W/ LASER ABLATION OF ENDOMETRIOSIS  06-12-2014   [facility]   LAPAROSCOPIC  OVARIAN CYSTECTOMY Left 01/08/2015   Procedure: LAPAROSCOPIC OVARIAN CYSTECTOMY;  Surgeon: Arvella Nigh, MD;  Location: [facility];  Service: Gynecology;  Laterality: Left;   LAPAROSCOPY N/A 01/08/2015   Procedure: LAPAROSCOPY DIAGNOSTIC;  Surgeon: Arvella Nigh, MD;  Location: [facility];  Service: Gynecology;  Laterality: N/A;   TONSILLECTOMY  age 22    Family History  Problem Relation Age of Onset   Diabetes Sister    Diabetes Maternal Grandfather    Hypertension Maternal Grandfather    Cancer Maternal Grandfather    Hypertension Paternal Grandmother    Alcohol abuse Mother    Cancer Maternal Grandmother    Cancer Paternal Grandfather    Heart attack Neg Hx     Social History   Socioeconomic History   Marital status: Single    Spouse name: Not on file   Number of children: 0   Years of education: 12   Highest education level: Not on file  Occupational History   Not on file  Tobacco Use   Smoking status: Never   Smokeless tobacco: Never  Substance and Sexual Activity   Alcohol use: Yes    Comment: occasional   Drug use: No   Sexual activity: Yes    Partners: Male    Birth control/protection: Surgical  Other Topics Concern   Not on file  Social History Narrative   Lives w/ mother    Caffeine use: Coffee once in awhile   Right-handed   Social Determinants  of Health   Financial Resource Strain: Not on file  Food Insecurity: Not on file  Transportation Needs: Not on file  Physical Activity: Not on file  Stress: Not on file  Social Connections: Not on file  Intimate Partner Violence: Not on file     Current Outpatient Medications:    acetaminophen (TYLENOL) 500 MG tablet, Take 500 mg by mouth every 6 (six) hours as needed., Disp: , Rfl:    albuterol (VENTOLIN HFA) 108 (90 Base) MCG/ACT inhaler, Inhale 1-2 puffs into the lungs every 6 (six) hours as needed for wheezing or shortness of breath., Disp: 8 g, Rfl: 1   benzonatate  (TESSALON) 200 MG capsule, Take 1 capsule (200 mg total) by mouth 3 (three) times daily as needed for cough., Disp: 30 capsule, Rfl: 0   estradiol (ESTRACE) 1 MG tablet, Take 1 mg by mouth daily., Disp: , Rfl:    predniSONE (DELTASONE) 50 MG tablet, Take 1 tab po daily x5 days., Disp: 5 tablet, Rfl: 0   promethazine (PHENERGAN) 12.5 MG tablet, Take 1 tablet (12.5 mg total) by mouth every 8 (eight) hours as needed for nausea or vomiting., Disp: 20 tablet, Rfl: 0  EXAM:  VITALS per patient if applicable: Temp 99.7 F (37.6 C)   Ht 5\' 2"  (1.575 m)   Wt 115 lb (52.2 kg)   LMP 12/11/2014 (Approximate)   BMI 21.03 kg/m   GENERAL: alert, oriented, appears well and in no acute distress  HEENT: atraumatic, conjunttiva clear, no obvious abnormalities on inspection of external nose and ears  NECK: normal movements of the head and neck  LUNGS: on inspection no signs of respiratory distress, breathing rate appears normal, no obvious gross SOB, gasping or wheezing  CV: no obvious cyanosis  MS: moves all visible extremities without noticeable abnormality  PSYCH/NEURO: pleasant and cooperative, no obvious depression or anxiety, speech and thought processing grossly intact  ASSESSMENT AND PLAN:  Discussed the following assessment and plan:  Sinobronchitis Discussed with her that symptoms seem viral in nature.  Supportive care with rest and increased fluids recommended.  She may continue OTC medications for fever, aches, cough.  She is requesting cough syrup with codeine, I declines to write this.. Recommended that she use delsym and I renewed tessalon perles.  Adding steroid burst as well.  Albuterol renewed.  Contact clinic if having worsening or non-resolving symptoms.      I discussed the assessment and treatment plan with the patient. The patient was provided an opportunity to ask questions and all were answered. The patient agreed with the plan and demonstrated an understanding of the  instructions.   The patient was advised to call back or seek an in-person evaluation if the symptoms worsen or if the condition fails to improve as anticipated.    12/13/2014, DO

## 2022-09-29 ENCOUNTER — Telehealth (INDEPENDENT_AMBULATORY_CARE_PROVIDER_SITE_OTHER): Payer: Self-pay | Admitting: Family Medicine

## 2022-09-29 ENCOUNTER — Other Ambulatory Visit (HOSPITAL_BASED_OUTPATIENT_CLINIC_OR_DEPARTMENT_OTHER): Payer: Self-pay

## 2022-09-29 ENCOUNTER — Encounter: Payer: Self-pay | Admitting: Family Medicine

## 2022-09-29 VITALS — Temp 99.9°F | Ht 62.0 in | Wt 115.0 lb

## 2022-09-29 DIAGNOSIS — R051 Acute cough: Secondary | ICD-10-CM

## 2022-09-29 DIAGNOSIS — R6889 Other general symptoms and signs: Secondary | ICD-10-CM | POA: Insufficient documentation

## 2022-09-29 MED ORDER — BENZONATATE 100 MG PO CAPS
100.0000 mg | ORAL_CAPSULE | Freq: Two times a day (BID) | ORAL | 0 refills | Status: DC | PRN
  Filled 2022-09-29: qty 30, 15d supply, fill #0

## 2022-09-29 MED ORDER — PROMETHAZINE-DM 6.25-15 MG/5ML PO SYRP
5.0000 mL | ORAL_SOLUTION | Freq: Three times a day (TID) | ORAL | 0 refills | Status: DC | PRN
  Filled 2022-09-29: qty 118, 8d supply, fill #0

## 2022-09-29 NOTE — Progress Notes (Signed)
I connected with  [employer] on 09/29/22 by a video enabled telemedicine application and verified that I am speaking with the correct person using two identifiers.   I discussed the limitations of evaluation and management by telemedicine. The patient expressed understanding and agreed to proceed.   Acute Office Visit  Subjective:     Patient ID: Amanda Vazquez, female    DOB: 1991/06/27, 32 y.o.   MRN: [id number]  Chief Complaint  Patient presents with   Generalized Body Aches   Cough    Took Tessalon cough perles.   Headache   Nasal Congestion    HPI Presents today for an acute visit with complaint of headache, nasal and chest congestion, sore throat, body aches, cough and watery eyes.  Symptoms have been present for 2 days Associated symptoms include: low grade fever, and chills Pertinent negatives: no shortness of breath, no chest pain Treatments tried include : benzonatate, guaf with codeine for cough Treatment effective : effective, she has ran out Sick contacts : no known flu exposures, was around a lot of people on New Years. Home Covid negative   Review of Systems  Constitutional:  Positive for chills and fever.  HENT:  Positive for congestion and sore throat.   Respiratory:  Positive for cough.   Cardiovascular:  Negative for chest pain.  Gastrointestinal:  Negative for nausea and vomiting.  Musculoskeletal:  Positive for myalgias.  Neurological:  Positive for headaches.        Objective:    Temp 99.9 F (37.7 C) (Oral) Comment: Last temp taken by pt.  Ht 5\' 2"  (1.575 m)   Wt 115 lb (52.2 kg)   LMP 12/11/2014 (Approximate)   BMI 21.03 kg/m    Physical Exam Vitals and nursing note reviewed.  Constitutional:      General: She is not in acute distress.    Appearance: She is ill-appearing.     Comments: Physical exam not performed: video visit  Pulmonary:     Effort: Pulmonary effort is normal.  Neurological:     General: No focal deficit  present.     Mental Status: She is alert. Mental status is at baseline.     No results found for any visits on 09/29/22.      Assessment & Plan:   Problem List Items Addressed This Visit     Acute cough - Primary   Relevant Medications   benzonatate (TESSALON) 100 MG capsule   promethazine-dextromethorphan (PROMETHAZINE-DM) 6.25-15 MG/5ML syrup   Flu-like symptoms    Meds ordered this encounter  Medications   benzonatate (TESSALON) 100 MG capsule    Sig: Take 1 capsule (100 mg total) by mouth 2 (two) times daily as needed for cough.    Dispense:  30 capsule    Refill:  0    Order Specific Question:   Supervising Provider    Answer:   Leeanne Rio [[id number]]   promethazine-dextromethorphan (PROMETHAZINE-DM) 6.25-15 MG/5ML syrup    Sig: Take 5 mLs by mouth every 8 (eight) hours as needed for cough. Do not drive while taking    Dispense:  118 mL    Refill:  0    Order Specific Question:   Supervising Provider    Answer:   Leeanne Rio [[id number]]  Video visit for flu-like symptoms with cough. Low-grade fever, headache and body aches.  Will come to office on 1/5 for nurse visit to get tested for Influenza virus. Has had negative home Covid test.  Acute  cough: benzonatate 100 mg and promethazine DM 6.25-15 mg as instructed.  Recommend supportive therapy with fluids and rest. Diet as tolerated.   Tylenol or ibuprofen for fever and body aches.   If influenza is positive, will treat with Tamiflu.  Agrees with plan of care discussed today. Questions answered. Follow-up if symptoms do not improve.   No follow-ups on file.  Chalmers Guest, FNP

## 2022-10-06 ENCOUNTER — Other Ambulatory Visit (HOSPITAL_BASED_OUTPATIENT_CLINIC_OR_DEPARTMENT_OTHER): Payer: Self-pay

## 2022-11-10 ENCOUNTER — Ambulatory Visit (INDEPENDENT_AMBULATORY_CARE_PROVIDER_SITE_OTHER): Payer: Self-pay | Admitting: Medical-Surgical

## 2022-11-10 ENCOUNTER — Encounter: Payer: Self-pay | Admitting: Medical-Surgical

## 2022-11-10 VITALS — BP 118/76 | HR 75 | Resp 20 | Ht 62.0 in | Wt 102.9 lb

## 2022-11-10 DIAGNOSIS — R233 Spontaneous ecchymoses: Secondary | ICD-10-CM

## 2022-11-10 DIAGNOSIS — F419 Anxiety disorder, unspecified: Secondary | ICD-10-CM

## 2022-11-10 DIAGNOSIS — R197 Diarrhea, unspecified: Secondary | ICD-10-CM

## 2022-11-10 DIAGNOSIS — R634 Abnormal weight loss: Secondary | ICD-10-CM

## 2022-11-10 DIAGNOSIS — R002 Palpitations: Secondary | ICD-10-CM

## 2022-11-10 MED ORDER — PROPRANOLOL HCL 10 MG PO TABS: 10.0000 mg | ORAL_TABLET | Freq: Three times a day (TID) | ORAL | 1 refills | Status: DC | PRN

## 2022-11-10 NOTE — Progress Notes (Addendum)
Established Patient Office Visit  Subjective   Patient ID: Amanda Vazquez, female   DOB: 1991/09/03 Age: 32 y.o. MRN: [id number]   Chief Complaint  Patient presents with   Weight Loss   Palpitations    Patient states that her symptoms started over the summer time and she states her symptoms are getting worse. Patient states that she is losing weight , bruises easily,having palpitations, along with anxiety and fatigue. Patient did state that her Bowel movements are always loose     HPI Pleasant 32 year old female presenting today for evaluation of multiple symptoms that have been concerning over the last year.  She reports that over the summer last year, she started losing weight unexpectedly.  To date, she feels like she is lost approximately 30 pounds.  She also has had an increase in her anxiety and panic attacks.  Previous anxiety issues had an identifiable trigger however these recent ones do not.  Notes that her heart races on an every day basis.  This is at various times and with no identifiable provocation.  She has an increased appetite and is eating quite a bit more than usual however she notes that sometimes she has intermittent nausea and has had several episodes of vomiting.  She has had an increase in bowel movements quoting to normal once per week but the rest are diarrhea.  Her stools are very loose and brown.  Also notes easy bruising on her upper and lower extremities.  Has had an increase in hair loss and notes that skin and nail changes.  When her heart is racing, she often feels dizzy and has experienced intermittent chest pain and shortness of breath during these episodes.   Objective:    Vitals:   11/10/22 1504  BP: 118/76  Pulse: 75  Resp: 20  Height: 5' 2"$  (1.575 m)  Weight: 102 lb 14.4 oz (46.7 kg)  SpO2: 100%  BMI (Calculated): 18.82   Physical Exam Vitals reviewed.  Constitutional:      General: She is not in acute distress.    Appearance: Normal  appearance. She is not ill-appearing.  HENT:     Head: Normocephalic and atraumatic.  Eyes:     Comments: Patient reports mild bilateral exophthalmos.  Eyes appear normal on evaluation however have not seen her in quite a while to be able to pinpoint a significant difference.  Cardiovascular:     Rate and Rhythm: Normal rate and regular rhythm.     Pulses: Normal pulses.     Heart sounds: Normal heart sounds.  Pulmonary:     Effort: Pulmonary effort is normal. No respiratory distress.     Breath sounds: Normal breath sounds. No wheezing, rhonchi or rales.  Skin:    General: Skin is warm and dry.  Neurological:     Mental Status: She is alert and oriented to person, place, and time.     Comments: Bilateral hands with fine tremor.  Psychiatric:        Mood and Affect: Mood normal.        Behavior: Behavior normal.        Thought Content: Thought content normal.        Judgment: Judgment normal.   No results found for this or any previous visit (from the past 24 hour(s)).     The ASCVD Risk score (Arnett DK, et al., 2019) failed to calculate for the following reasons:   The 2019 ASCVD risk score is only valid for ages 66  to 79   Assessment & Plan:   1. Palpitations 2. Unintended weight loss 3. Anxiety 4. Diarrhea, unspecified type 5. Easy bruising Checking labs as below.  Symptoms consistent with thyroid dysfunction so definitely consider hyperthyroidism.  Family history of thyroid disorders in first-degree relatives.  She is uninsured and currently needs to limit cost.  Plans to have insurance in about 2 weeks so we will hold off any further workup until then.  Once insurance in place, consider a long-term cardiac monitor to further evaluate tachycardic episodes.  Starting propranolol 10 mg 3 times daily as needed for tachycardia and anxiety.  Advised to monitor blood pressure at home to evaluate for hypotension. - COMPLETE METABOLIC PANEL WITH GFR - TSH - Hemoglobin A1c - T4,  free  Return if symptoms worsen or fail to improve.  ___________________________________________ Clearnce Sorrel, DNP, APRN, FNP-BC [facility]

## 2022-11-11 LAB — CBC WITH DIFFERENTIAL/PLATELET
Absolute Monocytes: 429 cells/uL (ref 200–950)
Basophils Absolute: 19 cells/uL (ref 0–200)
Basophils Relative: 0.3 %
Eosinophils Absolute: 19 cells/uL (ref 15–500)
Eosinophils Relative: 0.3 %
HCT: 42.4 % (ref 35.0–45.0)
Hemoglobin: 14.8 g/dL (ref 11.7–15.5)
Lymphs Abs: 2138 cells/uL (ref 850–3900)
MCH: 34.4 pg — ABNORMAL HIGH (ref 27.0–33.0)
MCHC: 34.9 g/dL (ref 32.0–36.0)
MCV: 98.6 fL (ref 80.0–100.0)
MPV: 9.7 fL (ref 7.5–12.5)
Monocytes Relative: 6.7 %
Neutro Abs: 3795 cells/uL (ref 1500–7800)
Neutrophils Relative %: 59.3 %
Platelets: 313 10*3/uL (ref 140–400)
RBC: 4.3 10*6/uL (ref 3.80–5.10)
RDW: 14 % (ref 11.0–15.0)
Total Lymphocyte: 33.4 %
WBC: 6.4 10*3/uL (ref 3.8–10.8)

## 2022-11-11 LAB — COMPLETE METABOLIC PANEL WITH GFR
AG Ratio: 1.9 (calc) (ref 1.0–2.5)
ALT: 12 U/L (ref 6–29)
AST: 13 U/L (ref 10–30)
Albumin: 4.7 g/dL (ref 3.6–5.1)
Alkaline phosphatase (APISO): 55 U/L (ref 31–125)
BUN: 13 mg/dL (ref 7–25)
CO2: 28 mmol/L (ref 20–32)
Calcium: 9.8 mg/dL (ref 8.6–10.2)
Chloride: 102 mmol/L (ref 98–110)
Creat: 0.87 mg/dL (ref 0.50–0.97)
Globulin: 2.5 g/dL (calc) (ref 1.9–3.7)
Glucose, Bld: 94 mg/dL (ref 65–99)
Potassium: 4 mmol/L (ref 3.5–5.3)
Sodium: 140 mmol/L (ref 135–146)
Total Bilirubin: 0.8 mg/dL (ref 0.2–1.2)
Total Protein: 7.2 g/dL (ref 6.1–8.1)
eGFR: 91 mL/min/{1.73_m2} (ref >=60)

## 2022-11-11 LAB — HEMOGLOBIN A1C
Hgb A1c MFr Bld: 4.9 % of total Hgb (ref <5.7)
Mean Plasma Glucose: 94 mg/dL
eAG (mmol/L): 5.2 mmol/L

## 2022-11-11 LAB — T4, FREE: Free T4: 1.3 ng/dL (ref 0.8–1.8)

## 2022-11-11 LAB — TSH: TSH: 0.98 mIU/L

## 2022-11-28 ENCOUNTER — Encounter: Payer: Self-pay | Admitting: Medical-Surgical

## 2022-12-26 ENCOUNTER — Encounter: Payer: Self-pay | Admitting: Medical-Surgical

## 2022-12-26 ENCOUNTER — Telehealth (INDEPENDENT_AMBULATORY_CARE_PROVIDER_SITE_OTHER): Payer: Self-pay | Admitting: Medical-Surgical

## 2022-12-26 DIAGNOSIS — K529 Noninfective gastroenteritis and colitis, unspecified: Secondary | ICD-10-CM

## 2022-12-26 DIAGNOSIS — F419 Anxiety disorder, unspecified: Secondary | ICD-10-CM

## 2022-12-26 MED ORDER — PROMETHAZINE HCL 25 MG PO TABS
25.0000 mg | ORAL_TABLET | Freq: Four times a day (QID) | ORAL | 2 refills | Status: DC | PRN
Start: 2022-12-26 — End: 2023-04-06

## 2022-12-26 MED ORDER — SERTRALINE HCL 50 MG PO TABS: ORAL_TABLET | ORAL | 3 refills | Status: DC

## 2022-12-26 NOTE — Progress Notes (Signed)
Virtual Visit via Video Note  I connected with [employer] on 12/26/22 at 11:10 AM EDT by a video enabled telemedicine application and verified that I am speaking with the correct person using two identifiers.   I discussed the limitations of evaluation and management by telemedicine and the availability of in person appointments. The patient expressed understanding and agreed to proceed.  Patient location: home Provider locations: office  History, Impression and Recommendations:    1. Gastroenteritis Has had 2-3 days of bodyaches, weakness, nausea with vomiting, headaches, diarrhea, and difficulty sleeping.  Notes that her symptoms were a little better yesterday but they have worsened today.  Ibuprofen, Tylenol, and over-the-counter measures not helpful.  Difficulty with eating and drinking due to GI symptoms.  Given the duration and onset of symptoms, suspect this is viral in nature.  Discussed conservative measures to use at home for symptom management.  Adding Phenergan since Zofran was not helpful for nausea.  Recommend brat diet, advance as tolerated. - promethazine (PHENERGAN) 25 MG tablet; Take 1 tablet (25 mg total) by mouth every 6 (six) hours as needed for nausea or vomiting.  Dispense: 30 tablet; Refill: 2  2. Anxiety Long history of issues with anxiety.  Not on any controller medicine but uses propranolol 10 mg 3 times daily as needed.  Feels that her symptoms are getting much worse and anxiety attacks are lasting longer.  Open to medication options.  Start sertraline 25 mg daily x 1 week then increase to 50 mg daily.  Continue propranolol as needed however can try up to 20 mg 3 times daily as needed. - sertraline (ZOLOFT) 50 MG tablet; Take 1/2 tablet (25mg ) daily x 7 days then increase to 1 full tablet (50mg ) daily.  Dispense: 30 tablet; Refill: 3  I discussed the assessment and treatment plan with the patient. The patient was provided an opportunity to ask questions and all  were answered. The patient agreed with the plan and demonstrated an understanding of the instructions.   The patient was advised to call back or seek an in-person evaluation if the symptoms worsen or if the condition fails to improve as anticipated.  25 minutes of non-face-to-face time was provided during this encounter.  Return in about 4 weeks (around 01/23/2023) for mood follow up.  Clearnce Sorrel, DNP, APRN, FNP-BC [facility]

## 2023-04-06 ENCOUNTER — Ambulatory Visit: Payer: No Typology Code available for payment source

## 2023-04-06 ENCOUNTER — Ambulatory Visit (INDEPENDENT_AMBULATORY_CARE_PROVIDER_SITE_OTHER): Payer: No Typology Code available for payment source | Admitting: Medical-Surgical

## 2023-04-06 ENCOUNTER — Encounter: Payer: Self-pay | Admitting: Medical-Surgical

## 2023-04-06 VITALS — BP 102/71 | HR 75 | Temp 97.6°F | Ht 62.0 in | Wt 112.0 lb

## 2023-04-06 DIAGNOSIS — R10824 Left lower quadrant rebound abdominal tenderness: Secondary | ICD-10-CM | POA: Diagnosis not present

## 2023-04-06 MED ORDER — KETOROLAC TROMETHAMINE 60 MG/2ML IM SOLN
60.0000 mg | Freq: Once | INTRAMUSCULAR | Status: AC
Start: 2023-04-06 — End: 2023-04-06
  Administered 2023-04-06: 60 mg via INTRAMUSCULAR

## 2023-04-06 MED ORDER — IOHEXOL 9 MG/ML PO SOLN: 500.0000 mL | ORAL | Status: AC

## 2023-04-06 MED ORDER — HYDROCODONE-ACETAMINOPHEN 5-325 MG PO TABS: 1.0000 | ORAL_TABLET | Freq: Three times a day (TID) | ORAL | 0 refills | Status: DC | PRN

## 2023-04-06 MED ORDER — ONDANSETRON 8 MG PO TBDP: 8.0000 mg | ORAL_TABLET | Freq: Three times a day (TID) | ORAL | 3 refills | Status: DC | PRN

## 2023-04-06 NOTE — Progress Notes (Signed)
        Established patient visit  History, exam, impression, and plan:  1. Left lower quadrant abdominal tenderness with rebound tenderness Pleasant 32 year old female presents today with reports of severe left lower quadrant abdominal pain and tenderness that has been present for 2 weeks.  Onset was abrupt and it woke her from sleep.  Pain has continued to worsen over the last 2 weeks and is now interfering with her ability to walk, sleep, and perform daily activities.  Has tried multiple measures including ibuprofen, Tylenol, heat, ice, massage, stretching, and topical analgesics.  No these measures have been helpful.  Denies abnormal bowel function, urinary symptoms, melena, hematochezia, recent injury, and dietary/medication changes.  No fevers, chills.  Has had some nausea secondary to pain.  See below for physical exam.  Presentation concerning for diverticulitis, bowel perforation, or bowel entrapment given her previous pelvic surgeries.  CT abdomen pelvis stat for further evaluation.  Checking CBC and CMP today.  Toradol 60 mg IM x 1 in the office.  She does have a history of opioid abuse and we have discussed this at length.  With her current level of pain, I will provide her with a very short prescription of hydrocodone and she is aware there will be absolutely no refills. - CT Abdomen Pelvis Wo Contrast; Future - ketorolac (TORADOL) injection 60 mg - CBC with Differential - COMPLETE METABOLIC PANEL WITH GFR   Physical Exam Vitals reviewed.  Constitutional:      General: She is not in acute distress.    Appearance: Normal appearance. She is well-developed. She is ill-appearing.  HENT:     Head: Normocephalic and atraumatic.  Cardiovascular:     Rate and Rhythm: Normal rate and regular rhythm.     Pulses: Normal pulses.     Heart sounds: Normal heart sounds. No murmur heard.    No friction rub. No gallop.  Pulmonary:     Effort: Pulmonary effort is normal. No respiratory distress.      Breath sounds: Normal breath sounds. No wheezing.  Abdominal:     General: Abdomen is flat. Bowel sounds are normal. There is distension (Mild left lower quadrant). There are no signs of injury.     Palpations: Abdomen is soft.     Tenderness: There is abdominal tenderness in the left lower quadrant. There is guarding and rebound. Positive signs include Rovsing's sign.  Skin:    General: Skin is warm and dry.  Neurological:     Mental Status: She is alert and oriented to person, place, and time.  Psychiatric:        Mood and Affect: Mood normal.        Behavior: Behavior normal.        Thought Content: Thought content normal.        Judgment: Judgment normal.    Procedures performed this visit: None.  Return if symptoms worsen or fail to improve.  __________________________________ Thayer Ohm, DNP, APRN, FNP-BC Primary Care and Sports Medicine [facility]

## 2023-04-07 ENCOUNTER — Other Ambulatory Visit: Payer: Self-pay

## 2023-04-07 LAB — COMPLETE METABOLIC PANEL WITH GFR
AG Ratio: 2 (calc) (ref 1.0–2.5)
ALT: 19 U/L (ref 6–29)
AST: 18 U/L (ref 10–30)
Albumin: 4.5 g/dL (ref 3.6–5.1)
Alkaline phosphatase (APISO): 51 U/L (ref 31–125)
BUN: 10 mg/dL (ref 7–25)
CO2: 30 mmol/L (ref 20–32)
Calcium: 10 mg/dL (ref 8.6–10.2)
Chloride: 103 mmol/L (ref 98–110)
Creat: 0.81 mg/dL (ref 0.50–0.97)
Globulin: 2.2 g/dL (calc) (ref 1.9–3.7)
Glucose, Bld: 75 mg/dL (ref 65–99)
Potassium: 4.7 mmol/L (ref 3.5–5.3)
Sodium: 140 mmol/L (ref 135–146)
Total Bilirubin: 1.2 mg/dL (ref 0.2–1.2)
Total Protein: 6.7 g/dL (ref 6.1–8.1)
eGFR: 99 mL/min/{1.73_m2} (ref >=60)

## 2023-04-07 LAB — CBC WITH DIFFERENTIAL/PLATELET
Absolute Monocytes: 508 cells/uL (ref 200–950)
Basophils Absolute: 23 cells/uL (ref 0–200)
Basophils Relative: 0.3 %
Eosinophils Absolute: 39 cells/uL (ref 15–500)
Eosinophils Relative: 0.5 %
HCT: 44.9 % (ref 35.0–45.0)
Hemoglobin: 15.5 g/dL (ref 11.7–15.5)
Lymphs Abs: 2010 cells/uL (ref 850–3900)
MCH: 34.9 pg — ABNORMAL HIGH (ref 27.0–33.0)
MCHC: 34.5 g/dL (ref 32.0–36.0)
MCV: 101.1 fL — ABNORMAL HIGH (ref 80.0–100.0)
MPV: 9.9 fL (ref 7.5–12.5)
Monocytes Relative: 6.6 %
Neutro Abs: 5121 cells/uL (ref 1500–7800)
Neutrophils Relative %: 66.5 %
Platelets: 274 10*3/uL (ref 140–400)
RBC: 4.44 10*6/uL (ref 3.80–5.10)
RDW: 12.7 % (ref 11.0–15.0)
Total Lymphocyte: 26.1 %
WBC: 7.7 10*3/uL (ref 3.8–10.8)

## 2023-04-11 ENCOUNTER — Encounter: Payer: Self-pay | Admitting: Medical-Surgical

## 2023-06-02 ENCOUNTER — Telehealth (INDEPENDENT_AMBULATORY_CARE_PROVIDER_SITE_OTHER): Payer: Self-pay | Admitting: Medical-Surgical

## 2023-06-02 ENCOUNTER — Other Ambulatory Visit (HOSPITAL_BASED_OUTPATIENT_CLINIC_OR_DEPARTMENT_OTHER): Payer: Self-pay

## 2023-06-02 ENCOUNTER — Encounter: Payer: Self-pay | Admitting: Medical-Surgical

## 2023-06-02 DIAGNOSIS — U071 COVID-19: Secondary | ICD-10-CM

## 2023-06-02 DIAGNOSIS — F411 Generalized anxiety disorder: Secondary | ICD-10-CM

## 2023-06-02 MED ORDER — PROMETHAZINE-DM 6.25-15 MG/5ML PO SYRP
5.0000 mL | ORAL_SOLUTION | Freq: Four times a day (QID) | ORAL | 0 refills | Status: DC | PRN
  Filled 2023-06-02: qty 118, 6d supply, fill #0

## 2023-06-02 MED ORDER — NIRMATRELVIR/RITONAVIR (PAXLOVID)TABLET
3.0000 | ORAL_TABLET | Freq: Two times a day (BID) | ORAL | 0 refills | Status: AC
  Filled 2023-06-02: qty 30, 5d supply, fill #0

## 2023-06-02 MED ORDER — CLONAZEPAM 0.5 MG PO TABS
0.5000 mg | ORAL_TABLET | Freq: Two times a day (BID) | ORAL | 0 refills | Status: DC | PRN
  Filled 2023-06-02: qty 20, 10d supply, fill #0

## 2023-06-02 MED ORDER — VENLAFAXINE HCL ER 37.5 MG PO CP24
ORAL_CAPSULE | ORAL | 1 refills | Status: DC
Start: 2023-06-02 — End: 2023-11-13
  Filled 2023-06-02: qty 30, 30d supply, fill #0

## 2023-06-02 NOTE — Progress Notes (Signed)
Virtual Visit via Video Note  I connected with [employer] on 06/02/23 at  1:00 PM EDT by a video enabled telemedicine application and verified that I am speaking with the correct person using two identifiers.   I discussed the limitations of evaluation and management by telemedicine and the availability of in person appointments. The patient expressed understanding and agreed to proceed.  Patient location: home Provider locations: office  Subjective:    CC: COVID-positive  HPI: Pleasant 32 year old female presenting via MyChart video visit with reports of testing positive for COVID.  She is on day 4 of COVID symptoms including fever, chills, cough, body aches, headache, sinus congestion, fatigue, and poor appetite.  She has tried Tylenol cold and flu, ibuprofen, pushing fluids, and rest with minimal relief of symptoms.  Having difficulty sleeping due to the cough.  Reports that her anxiety has been extremely high lately and she is having difficulty managing.  Interested in medication to help and is now willing to try something on a daily basis.  Extensive history of multiple medications tried with poor response or intolerance to medication.  Having panic attacks on a daily basis.   Past medical history, Surgical history, Family history not pertinant except as noted below, Social history, Allergies, and medications have been entered into the medical record, reviewed, and corrections made.   Review of Systems: See HPI for pertinent positives and negatives.   Objective:    General: Speaking clearly in complete sentences without any shortness of breath.  Alert and oriented x3.  Normal judgment. No apparent acute distress.  Impression and Recommendations:    1. COVID-19 virus infection Start Paxlovid twice daily x 5 days.  Adding Promethazine DM for cough as needed.  Avoiding narcotic cough medications due to history of opioid abuse.  Discussed conservative measures for additional  symptom management.  2. Generalized anxiety disorder Reviewed records.  She did not tolerate Cymbalta however she has never tried Effexor.  Starting Effexor 37.5 mg daily and plan to reevaluate in 4 weeks.  For immediate panic attacks, I did agree to a small quantity of clonazepam at the lowest dose available but we will only continue this for a maximum of 4 weeks.  Patient verbalized understanding and is agreeable to the plan.  I discussed the assessment and treatment plan with the patient. The patient was provided an opportunity to ask questions and all were answered. The patient agreed with the plan and demonstrated an understanding of the instructions.   The patient was advised to call back or seek an in-person evaluation if the symptoms worsen or if the condition fails to improve as anticipated.  Return in about 4 weeks (around 06/30/2023) for mood follow up.  Thayer Ohm, DNP, APRN, FNP-BC [facility] and Sports Medicine

## 2023-07-03 ENCOUNTER — Encounter: Payer: Self-pay | Admitting: Medical-Surgical

## 2023-07-03 ENCOUNTER — Other Ambulatory Visit: Payer: Self-pay | Admitting: Medical-Surgical

## 2023-07-04 ENCOUNTER — Other Ambulatory Visit (HOSPITAL_BASED_OUTPATIENT_CLINIC_OR_DEPARTMENT_OTHER): Payer: Self-pay

## 2023-11-10 ENCOUNTER — Ambulatory Visit: Payer: Self-pay | Admitting: Medical-Surgical

## 2023-11-10 NOTE — Telephone Encounter (Signed)
Attempted call to patient. Left a voice mail message requesting a return call.

## 2023-11-10 NOTE — Telephone Encounter (Signed)
Forwarding message to Dr. Ashley Royalty covering Christen Butter, NP

## 2023-11-10 NOTE — Telephone Encounter (Signed)
  Chief Complaint: R arm numbness and pain Symptoms: numbness and pain Frequency: 2-3 weeks Pertinent Negatives: Patient denies fever, injury, SOB, redness Disposition: [] ED /[] Urgent Care (no appt availability in office) / [x] Appointment(In office/virtual)/ []  [facility]/ [] Home Care/ [] Refused Recommended Disposition /[] [facility]/ []  Follow-up with PCP Additional Notes: Pt with pain to R shoulder blade about 2-3 weeks ago. Pt states that she now has numbness to her R arm, pointer finger and thumb to the spine. Pt states that it is impacting her sleep at this point. Hurts to lift her arm. Denies injury to the R arm or back. Sched 2/17, pt asking for something for pain until the appt. Pt has utilized OTC pain medications, as well as ice/heat, icey hot, lido patches, and warm shower. Pt does not have ins and wants to avoid using UC.   Copied from CRM [id number]. Topic: Clinical - Red Word Triage >> Nov 10, 2023  9:31 AM Dennison Nancy wrote: Red Word that prompted transfer to Nurse Triage: Patient hurt her right arm and shoulder can barely move her right arm . Numbness in  thumb and pointer finger  started this pass saturday Reason for Disposition  Numbness (i.e., loss of sensation) in hand or fingers  Answer Assessment - Initial Assessment Questions 1. ONSET: "When did the pain start?"     2-3 weeks aago 2. LOCATION: "Where is the pain located?"     R arm to spine 3. PAIN: "How bad is the pain?" (Scale 1-10; or mild, moderate, severe)   - MILD (1-3): Doesn't interfere with normal activities.   - MODERATE (4-7): Interferes with normal activities (e.g., work or school) or awakens from sleep.   - SEVERE (8-10): Excruciating pain, unable to do any normal activities, unable to hold a cup of water.     8 4. WORK OR EXERCISE: "Has there been any recent work or exercise that involved this part of the body?"     denies 5. CAUSE: "What do you think is causing the arm pain?"      Pinched nerve? 6. OTHER SYMPTOMS: "Do you have any other symptoms?" (e.g., neck pain, swelling, rash, fever, numbness, weakness)     denies 7. PREGNANCY: "Is there any chance you are pregnant?" "When was your last menstrual period?"     denies  Protocols used: Arm Pain-A-AH

## 2023-11-13 ENCOUNTER — Encounter: Payer: Self-pay | Admitting: Medical-Surgical

## 2023-11-13 ENCOUNTER — Ambulatory Visit (INDEPENDENT_AMBULATORY_CARE_PROVIDER_SITE_OTHER): Payer: Self-pay | Admitting: Medical-Surgical

## 2023-11-13 VITALS — BP 127/82 | HR 87 | Resp 20 | Ht 62.0 in | Wt 110.6 lb

## 2023-11-13 DIAGNOSIS — M5412 Radiculopathy, cervical region: Secondary | ICD-10-CM

## 2023-11-13 MED ORDER — PREDNISONE 50 MG PO TABS: 50.0000 mg | ORAL_TABLET | Freq: Every day | ORAL | 0 refills | Status: DC

## 2023-11-13 MED ORDER — METHYLPREDNISOLONE ACETATE 80 MG/ML IJ SUSP
80.0000 mg | Freq: Once | INTRAMUSCULAR | Status: AC
Start: 2023-11-13 — End: 2023-11-13
  Administered 2023-11-13: 80 mg via INTRAMUSCULAR

## 2023-11-13 MED ORDER — KETOROLAC TROMETHAMINE 60 MG/2ML IM SOLN
60.0000 mg | Freq: Once | INTRAMUSCULAR | Status: AC
Start: 2023-11-13 — End: 2023-11-13
  Administered 2023-11-13: 60 mg via INTRAMUSCULAR

## 2023-11-13 MED ORDER — CYCLOBENZAPRINE HCL 10 MG PO TABS
5.0000 mg | ORAL_TABLET | Freq: Three times a day (TID) | ORAL | 0 refills | Status: AC | PRN
Start: 2023-11-13 — End: ?

## 2023-11-13 NOTE — Progress Notes (Signed)
        Established patient visit  History, exam, impression, and plan:  1. Right cervical radiculopathy (Primary) Pleasant 33 year old female presenting today with reports of 2 weeks of right shoulder and arm pain.  Does not remember any injury or unusual activity however notes that the pain started at the base of her right neck and extended around the shoulder blade, into the shoulder, down the front of her arm and then down to her hand.  She has also developed numbness and tingling from the antecubital area down into the index finger and thumb of the right hand.  Has subjective weakness that she feels is secondary to pain.  Neurovascularly intact.  Limited active range of motion with abduction and adduction, flexion, extension, and rotation of the upper extremity.  Strong patient grimace with flinching when palpating the right lower neck trigger point along to the upper back at the medial scapular edge and up over into the shoulder.  Similar response to touching the upper arm even with light touch.  She is uninsured so holding off on x-rays today.  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  Plan for Toradol 60 mg IM and Depo-Medrol 80 mg IM while in office today.  Tomorrow, start prednisone 50 mg daily for 5 days followed by Aleve 1 tablet twice daily.  Adding Flexeril 5-10 mg 3 times daily as needed.  Home exercises provided in print with AVS.  If no improvement with conservative measures in 4-6 weeks, return for further evaluation with Dr. Benjamin Stain. - ketorolac (TORADOL) injection 60 mg - methylPREDNISolone acetate (DEPO-MEDROL) injection 80 mg  Procedures performed this visit: None.  Return for right arm pain with Dr. Karie Schwalbe if no better with 4-6 weeks of tx (steroids, NSAIDs, home exercises).  __________________________________ Thayer Ohm, DNP, APRN, FNP-BC Primary Care and Sports Medicine [facility]

## 2023-11-13 NOTE — Telephone Encounter (Signed)
 Patient informed.

## 2023-11-15 ENCOUNTER — Encounter: Payer: Self-pay | Admitting: Medical-Surgical

## 2023-11-17 ENCOUNTER — Telehealth: Payer: Self-pay

## 2023-11-17 NOTE — Telephone Encounter (Signed)
Need to know what kind of reaction that she had please.  Thanks, Ander Slade

## 2023-11-17 NOTE — Telephone Encounter (Signed)
Allergic reaction request for new med.  Copied from CRM [id number]. Topic: Clinical - Prescription Issue >> Nov 16, 2023  9:58 AM Martha Clan wrote: Reason for CRM: After Patient received shots Feb 17th and had a negative reaction. Patient is requesting a different steroid to avoid the symptoms.

## 2024-01-01 ENCOUNTER — Ambulatory Visit: Payer: Self-pay | Admitting: Medical-Surgical

## 2024-01-01 NOTE — Telephone Encounter (Signed)
 Copied from CRM [id number]. Topic: Clinical - Red Word Triage >> Jan 01, 2024  8:14 AM Suzette B wrote: Patient is having really bad panic attack and anxiety   Chief Complaint: Anxiett Symptoms: chest pain, tingling in limbs Frequency: constant Pertinent Negatives: Patient denies suicidal ideation Disposition: [x] ED /[] Urgent Care (no appt availability in office) / [] Appointment(In office/virtual)/ []  [facility]/ [] Home Care/ [] Refused Recommended Disposition /[] [facility]/ []  Follow-up with PCP Additional Notes: No appts avail today, pt advised to go to the ED. Pt agreeable and will have Aunt to transport  Reason for Disposition  [1] Panic attack symptoms (e.g., sudden onset of intense fear and symptoms such as dizziness, feeling of impending doom or fear of dying, hyperventilation, numbness or tingling, sweating, trembling) AND [2] has not been evaluated for this by doctor (or NP/PA)  [1] SEVERE anxiety (e.g., extremely anxious with intense emotional symptoms such as feeling of unreality, urge to flee, unable to calm down; unable to cope or function) AND [2] not better after 10 minutes of reassurance and Care Advice  Answer Assessment - Initial Assessment Questions 1. CONCERN: "Did anything happen that prompted you to call today?"      Have been having anxiety on/off, was in control now getting worse  2. ANXIETY SYMPTOMS: "Can you describe how you (your loved one; patient) have been feeling?" (e.g., tense, restless, panicky, anxious, keyed up, overwhelmed, sense of impending doom).      Shaky, overwhelmed, feeling tense  3. ONSET: "How long have you been feeling this way?" (e.g., hours, days, weeks)     Started this morning  4. SEVERITY: "How would you rate the level of anxiety?" (e.g., 0 - 10; or mild, moderate, severe).     8/10 severe  5. FUNCTIONAL IMPAIRMENT: "How have these feelings affected your ability to do daily activities?" "Have you had more difficulty  than usual doing your normal daily activities?" (e.g., getting better, same, worse; self-care, school, work, interactions)     Unable to do self care this morning  6. HISTORY: "Have you felt this way before?" "Have you ever been diagnosed with an anxiety problem in the past?" (e.g., generalized anxiety disorder, panic attacks, PTSD). If Yes, ask: "How was this problem treated?" (e.g., medicines, counseling, etc.)     Medications (xanax)  7. RISK OF HARM - SUICIDAL IDEATION: "Do you ever have thoughts of hurting or killing yourself?" If Yes, ask:  "Do you have these feelings now?" "Do you have a plan on how you would do this?"     No thoughts of self harm  8. TREATMENT:  "What has been done so far to treat this anxiety?" (e.g., medicines, relaxation strategies). "What has helped?"     Used an ice pack on chest  9. TREATMENT - THERAPIST: "Do you have a counselor or therapist? Name?"     No  10. POTENTIAL TRIGGERS: "Do you drink caffeinated beverages (e.g., coffee, colas, teas), and how much daily?" "Do you drink alcohol or use any drugs?" "Have you started any new medicines recently?"       *No Answer* 11. PATIENT SUPPORT: "Who is with you now?" "Who do you live with?" "Do you have family or friends who you can talk to?"        Dog is with her now, lives with aunt and uncle. At home currently  12. OTHER SYMPTOMS: "Do you have any other symptoms?" (e.g., feeling depressed, trouble concentrating, trouble sleeping, trouble breathing, palpitations or fast  heartbeat, chest pain, sweating, nausea, or diarrhea)       Shaky, can't settle, fast heartbeat, nausea  13. PREGNANCY: "Is there any chance you are pregnant?" "When was your last menstrual period?"       No; lhysterectomy 7 years ago  Protocols used: Anxiety and Panic Attack-A-AH

## 2024-01-02 NOTE — Telephone Encounter (Signed)
 Attempted call to patient. Left a voice mail message requesting a return call.

## 2024-01-03 NOTE — Telephone Encounter (Signed)
 Attempted call to patient. Left a voice mail message requesting a return call.

## 2024-01-09 NOTE — Telephone Encounter (Signed)
 Attempted call again to patient . Again left a voice mail message.

## 2024-03-13 ENCOUNTER — Encounter: Payer: Self-pay | Admitting: Urgent Care

## 2024-03-13 ENCOUNTER — Telehealth: Payer: Self-pay | Admitting: Urgent Care

## 2024-03-13 ENCOUNTER — Ambulatory Visit: Payer: Self-pay

## 2024-03-13 ENCOUNTER — Other Ambulatory Visit (HOSPITAL_BASED_OUTPATIENT_CLINIC_OR_DEPARTMENT_OTHER): Payer: Self-pay

## 2024-03-13 DIAGNOSIS — Z634 Disappearance and death of family member: Secondary | ICD-10-CM

## 2024-03-13 DIAGNOSIS — F430 Acute stress reaction: Secondary | ICD-10-CM

## 2024-03-13 DIAGNOSIS — F410 Panic disorder [episodic paroxysmal anxiety] without agoraphobia: Secondary | ICD-10-CM

## 2024-03-13 MED ORDER — ALPRAZOLAM 0.5 MG PO TABS
0.2500 mg | ORAL_TABLET | Freq: Two times a day (BID) | ORAL | 0 refills | Status: DC | PRN
Start: 2024-03-13 — End: 2024-04-01
  Filled 2024-03-13: qty 20, 10d supply, fill #0

## 2024-03-13 MED ORDER — BUPROPION HCL ER (XL) 150 MG PO TB24
150.0000 mg | ORAL_TABLET | ORAL | 2 refills | Status: AC
Start: 2024-03-13 — End: ?
  Filled 2024-03-13: qty 90, 90d supply, fill #0

## 2024-03-13 NOTE — Telephone Encounter (Signed)
 FYI - the patient is scheduled to see the provider on 03/14/24 at 10 am.

## 2024-03-13 NOTE — Telephone Encounter (Signed)
 FYI Only or Action Required?: Action required by provider  Patient was last seen in primary care on 11/13/2023 by Cherre Cornish, NP. Called Nurse Triage reporting Anxiety. Symptoms began yesterday. Interventions attempted: Nothing. Symptoms are: gradually worsening.  Triage Disposition: See Physician Within 24 Hours  Patient/caregiver understands and will follow disposition?: YesCopied from CRM [id number]. Topic: Clinical - Red Word Triage >> Mar 13, 2024  8:09 AM Tisa Forester wrote: Red Word that prompted transfer to Nurse Triage: Struggling with Anxiety her Grandmother passed away   Patient [phone] Reason for Disposition  Patient sounds very upset or troubled to the triager  Answer Assessment - Initial Assessment Questions 1. CONCERN: Did anything happen that prompted you to call today?      Grandmother passed yesterday  2. ANXIETY SYMPTOMS: Can you describe how you (your loved one; patient) have been feeling? (e.g., tense, restless, panicky, anxious, keyed up, overwhelmed, sense of impending doom).      Anxious, angry, sad 3. ONSET: How long have you been feeling this way? (e.g., hours, days, weeks)     Yesterday  4. SEVERITY: How would you rate the level of anxiety? (e.g., 0 - 10; or mild, moderate, severe).     8 5. FUNCTIONAL IMPAIRMENT: How have these feelings affected your ability to do daily activities? Have you had more difficulty than usual doing your normal daily activities? (e.g., getting better, same, worse; self-care, school, work, interactions)     na 6. HISTORY: Have you felt this way before? Have you ever been diagnosed with an anxiety problem in the past? (e.g., generalized anxiety disorder, panic attacks, PTSD). If Yes, ask: How was this problem treated? (e.g., medicines, counseling, etc.)     Yes , hx of depression 7. RISK OF HARM - SUICIDAL IDEATION: Do you ever have thoughts of hurting or killing yourself? If Yes, ask:  Do you have these feelings now?  Do you have a plan on how you would do this?     Na  8. TREATMENT:  What has been done so far to treat this anxiety? (e.g., medicines, relaxation strategies). What has helped?     Na  9. TREATMENT - THERAPIST: Do you have a counselor or therapist? Name?     Na  10. POTENTIAL TRIGGERS: Do you drink caffeinated beverages (e.g., coffee, colas, teas), and how much daily? Do you drink alcohol or use any drugs? Have you started any new medicines recently?       Emotions all over the place  11. PATIENT SUPPORT: Who is with you now? Who do you live with? Do you have family or friends who you can talk to?        At work 12. OTHER SYMPTOMS: Do you have any other symptoms? (e.g., feeling depressed, trouble concentrating, trouble sleeping, trouble breathing, palpitations or fast heartbeat, chest pain, sweating, nausea, or diarrhea)       Nausea, numb feeling      Pt very upset over passing of grandmother yesterday. Grandmother was her support person. Pt is feeling overwhelmed with sadness. Pt has hx of depression. Pt does not currently take any medication for depression/anxiety.  Protocols used: Anxiety and Panic Attack-A-AH

## 2024-03-13 NOTE — Telephone Encounter (Signed)
 Pt was seen today via virtual visit

## 2024-03-13 NOTE — Progress Notes (Signed)
 Virtual Visit via Video Note  I connected with [employer] on 03/13/24 at 10:00 AM EDT by a video enabled telemedicine application and verified that I am speaking with the correct person using two identifiers.  Patient Location: Other:  parked car Provider Location: Office/Clinic  I discussed the limitations, risks, security, and privacy concerns of performing an evaluation and management service by video and the availability of in person appointments. I also discussed with the patient that there may be a patient responsible charge related to this service. The patient expressed understanding and agreed to proceed.   Subjective  PCP: Cherre Cornish, NP  Chief Complaint  Patient presents with   Anxiety    Grieving pt lost her grandmother yesterday    HPI:   Pleasant 33 year old female with a history of anxiety who presents with worsening anxiety and panic symptoms.  She experiences worsening anxiety and panic symptoms, which have intensified recently. Symptoms include chest tightness, clammy and shaky hands, and a pins and needles sensation in her arms. She also feels like she can't breathe at times. These symptoms occur intermittently throughout the day, significantly impacting her ability to focus and complete tasks.  She has a history of anxiety treatment following her mother's passing six years ago, during which she was prescribed alprazolam  and Wellbutrin , which provided some relief at the time. Currently, her symptoms are affecting her appetite, leading to periods of overeating or undereating, and her sleep pattern is disrupted, with episodes of sleeping excessively or being unable to fall asleep at all.  No personal thoughts of self-harm. Sx have been exacerbated by the recent passing of her grandmother and feelings of separation anxiety and isolation.       ROS: Per HPI. All other pertinent systems are negative.   Current Outpatient Medications:    ALPRAZolam  (XANAX )  0.5 MG tablet, Take 0.5-1 tablets (0.25-0.5 mg total) by mouth 2 (two) times daily as needed., Disp: 20 tablet, Rfl: 0   buPROPion  (WELLBUTRIN  XL) 150 MG 24 hr tablet, Take 1 tablet (150 mg total) by mouth every morning., Disp: 90 tablet, Rfl: 2   acetaminophen  (TYLENOL ) 500 MG tablet, Take 500 mg by mouth every 6 (six) hours as needed., Disp: , Rfl:    albuterol  (VENTOLIN  HFA) 108 (90 Base) MCG/ACT inhaler, Inhale 1-2 puffs into the lungs every 6 (six) hours as needed for wheezing or shortness of breath., Disp: 8 g, Rfl: 1   estradiol  (ESTRACE ) 1 MG tablet, Take 1 mg by mouth daily., Disp: , Rfl:     Objective  Vital signs not able to be obtained due to this being a virtual visit.  Physical Exam Constitutional:      Appearance: Normal appearance.  HENT:     Head: Normocephalic and atraumatic.  Pulmonary:     Effort: Pulmonary effort is normal. No respiratory distress.   Neurological:     Mental Status: She is alert and oriented to person, place, and time.   Psychiatric:        Attention and Perception: Attention normal.        Mood and Affect: Mood is anxious. Affect is tearful.      Assessment & Plan Bereavement  1. Bereavement - buPROPion  (WELLBUTRIN  XL) 150 MG 24 hr tablet; Take 1 tablet (150 mg total) by mouth every morning.  2. Panic attack as reaction to stress - ALPRAZolam  (XANAX ) 0.5 MG tablet; Take 1/2 - 1 tab PO twice daily as needed for panic  Panic  attack as reaction to stress  1. Bereavement - buPROPion  (WELLBUTRIN  XL) 150 MG 24 hr tablet; Take 1 tablet (150 mg total) by mouth every morning.  2. Panic attack as reaction to stress - ALPRAZolam  (XANAX ) 0.5 MG tablet; Take 1/2 - 1 tab PO twice daily as needed for panic  Assessment and Plan    Anxiety and Panic Disorder Anxiety and panic symptoms have worsened, affecting appetite and sleep. Previous treatment with alprazolam  and Wellbutrin  was partially effective. Recommended daily medication for at least nine  months to prevent rebound symptoms. Caution advised with alprazolam  due to sedative effects. - Prescribe alprazolam  0.5 mg for panic attacks, start with half tablet to assess tolerance. - Discuss sedative effects of alprazolam , advise against driving or working post-administration. - Recommend follow-up in 3-4 weeks to assess medication tolerance and symptom management. - Provide information on behavioral health resources and crisis hotlines.  Follow-up Follow-up in 3-4 weeks to evaluate medication effectiveness and tolerance. Virtual follow-up is an option if more convenient. - Schedule follow-up appointment in 3-4 weeks, either virtually or in-person, depending on availability.       No follow-ups on file.   I discussed the assessment and treatment plan with the patient. The patient was provided an opportunity to ask questions, and all were answered. The patient agreed with the plan and demonstrated an understanding of the instructions.   The patient was advised to call back or seek an in-person evaluation if the symptoms worsen or if the condition fails to improve as anticipated.  The above assessment and management plan was discussed with the patient. The patient verbalized understanding of and has agreed to the management plan.   Mandy Second, PA

## 2024-03-13 NOTE — Patient Instructions (Signed)
 Please start Wellbutrin  daily.  Take 1/2 tab alprazolam  as needed for panic attacks. If ineffective, may increase to a full tab.  Go to [facility] for any emergent psychiatric needs [address] [place], [place] [address] [phone]   Please schedule a follow up appointment in 3-4 weeks

## 2024-04-01 ENCOUNTER — Telehealth (INDEPENDENT_AMBULATORY_CARE_PROVIDER_SITE_OTHER): Payer: Self-pay | Admitting: Urgent Care

## 2024-04-01 ENCOUNTER — Other Ambulatory Visit (HOSPITAL_BASED_OUTPATIENT_CLINIC_OR_DEPARTMENT_OTHER): Payer: Self-pay

## 2024-04-01 ENCOUNTER — Encounter: Payer: Self-pay | Admitting: Urgent Care

## 2024-04-01 DIAGNOSIS — Z634 Disappearance and death of family member: Secondary | ICD-10-CM

## 2024-04-01 DIAGNOSIS — F430 Acute stress reaction: Secondary | ICD-10-CM

## 2024-04-01 DIAGNOSIS — F410 Panic disorder [episodic paroxysmal anxiety] without agoraphobia: Secondary | ICD-10-CM

## 2024-04-01 MED ORDER — BUSPIRONE HCL 10 MG PO TABS
ORAL_TABLET | ORAL | 2 refills | Status: AC
Start: 2024-04-01 — End: 2024-07-15
  Filled 2024-04-01: qty 60, 38d supply, fill #0

## 2024-04-01 MED ORDER — ALPRAZOLAM 0.5 MG PO TABS
0.2500 mg | ORAL_TABLET | Freq: Two times a day (BID) | ORAL | 0 refills | Status: DC | PRN
  Filled 2024-04-01: qty 10, 5d supply, fill #0

## 2024-04-01 NOTE — Patient Instructions (Addendum)
 Please continue the Wellbutrin .  Please add Buspirone  to help with anxiety. Start with 1/2 tab once daily x 5 days, then increase to 1/2 tab twice daily x 5 days, then 1 tab in AM, 1/2 tab in PM x 5 days, then continue with 1 tab twice daily.  Take xanax  sparingly. This should not be taken daily.  Please practice the deep breathing techniques discussed today to help reset your anxiety. Also look into purchasing Calmigo, this can also be very effective for anxiety symptoms.  The following may also be beneficial: - over the counter L-tryptophan. Taking 500mg  three times daily, or 1000mg  prior to bed. This medication can make you feel sleepy - additional over the counter stress relief medications --> Boiron - Stress calm are natural herbal tablets - essential oils, in particular lavender, can be helpful - practicing deep breathing.   I have placed a referral for therapy as this may also help you through this tough time.  Please schedule a follow up visit in about 4 weeks, sooner if needed.

## 2024-04-01 NOTE — Progress Notes (Signed)
 Virtual Visit via Video Note  I connected with [employer] on 04/01/24 at  4:00 PM EDT by a video enabled telemedicine application and verified that I am speaking with the correct person using two identifiers.  Patient Location: Home Provider Location: Office/Clinic  I discussed the limitations, risks, security, and privacy concerns of performing an evaluation and management service by video and the availability of in person appointments. I also discussed with the patient that there may be a patient responsible charge related to this service. The patient expressed understanding and agreed to proceed.   Subjective  PCP: Amanda Mini, NP  Chief Complaint  Patient presents with   Anxiety    HPI:   [name] presents for follow up. Was seen via video visit on 03/13/24 due to bereavement and panic attacks. At that time, she was started on Wellbutrin  and xanax . Pt states that the wellbutrin  has help significantly with the depression aspect, but is still suffering significantly with anxiety. States that some days she has a hard time getting out of bed and doing her normal activities. This is accompanied with physical symptoms such as shortness of breath, paresthesias. She has been taking xanax  just about daily, and states sx resolve within about 30 min of taking the medication. She is hoping for a referral for therapy and to get her anxiety under better control.   ROS: Per HPI. All other pertinent systems are negative.   Current Outpatient Medications:    busPIRone  (BUSPAR ) 10 MG tablet, Take 0.5 tablets (5 mg total) by mouth daily for 5 days, THEN 0.5 tablets (5 mg total) 2 (two) times daily for 5 days, THEN 1 tablet (10 mg total) every morning AND 0.5 tablets (5 mg) every evening for 5 days, THEN 1 tablet (10 mg total) 2 (two) times daily for 5 days., Disp: 60 tablet, Rfl: 2   acetaminophen  (TYLENOL ) 500 MG tablet, Take 500 mg by mouth every 6 (six) hours as needed., Disp: , Rfl:     albuterol  (VENTOLIN  HFA) 108 (90 Base) MCG/ACT inhaler, Inhale 1-2 puffs into the lungs every 6 (six) hours as needed for wheezing or shortness of breath., Disp: 8 g, Rfl: 1   ALPRAZolam  (XANAX ) 0.5 MG tablet, Take 0.5-1 tablets (0.25-0.5 mg total) by mouth 2 (two) times daily as needed., Disp: 10 tablet, Rfl: 0   buPROPion  (WELLBUTRIN  XL) 150 MG 24 hr tablet, Take 1 tablet (150 mg total) by mouth every morning., Disp: 90 tablet, Rfl: 2   estradiol  (ESTRACE ) 1 MG tablet, Take 1 mg by mouth daily., Disp: , Rfl:     Objective  Vital signs not able to be obtained due to this being a virtual visit.  Physical Exam Vitals and nursing note reviewed.  Constitutional:      General: She is not in acute distress.    Appearance: Normal appearance. She is not ill-appearing, toxic-appearing or diaphoretic.  HENT:     Head: Normocephalic and atraumatic.  Eyes:     General: No scleral icterus.       Right eye: No discharge.        Left eye: No discharge.  Cardiovascular:     Rate and Rhythm: Normal rate.  Pulmonary:     Effort: Pulmonary effort is normal. No respiratory distress.  Skin:    General: Skin is warm and dry.     Coloration: Skin is not jaundiced.     Findings: No bruising, erythema or rash.  Neurological:  General: No focal deficit present.     Mental Status: She is alert and oriented to person, place, and time.     Gait: Gait normal.  Psychiatric:        Mood and Affect: Mood normal.        Behavior: Behavior normal.      Assessment & Plan Panic attack as reaction to stress  1. Panic attack as reaction to stress - busPIRone  (BUSPAR ) 10 MG tablet; Start with 1/2 tab once daily x 5 days, then increase to 1/2 tab twice daily x 5 days, then 1 tab in AM, 1/2 tab in PM x 5 days, then continue with 1 tab twice daily. - ALPRAZolam  (XANAX ) 0.5 MG tablet; Take 0.5-1 tablets (0.25-0.5 mg total) by mouth 2 (two) times daily as needed. - Ambulatory referral to Behavioral Health  2.  Bereavement - Ambulatory referral to Behavioral Health  Bereavement  1. Panic attack as reaction to stress - busPIRone  (BUSPAR ) 10 MG tablet; Start with 1/2 tab once daily x 5 days, then increase to 1/2 tab twice daily x 5 days, then 1 tab in AM, 1/2 tab in PM x 5 days, then continue with 1 tab twice daily. - ALPRAZolam  (XANAX ) 0.5 MG tablet; Take 0.5-1 tablets (0.25-0.5 mg total) by mouth 2 (two) times daily as needed. - Ambulatory referral to Behavioral Health  2. Bereavement - Ambulatory referral to [facility]   Discussed with pt goal of therapy is to prevent anxiety/ panic. Will continue wellbutrin  as this has been helping with depression, but will add buspar  to help with the anxiety aspect as well. Will do a small refill of xanax , pt understands this is not to be a chronic medication. We discussed other coping mechanisms today as well such as deep breathing and will refer for therapy.   Return in about 4 weeks (around 04/29/2024).   I discussed the assessment and treatment plan with the patient. The patient was provided an opportunity to ask questions, and all were answered. The patient agreed with the plan and demonstrated an understanding of the instructions.   The patient was advised to call back or seek an in-person evaluation if the symptoms worsen or if the condition fails to improve as anticipated.  The above assessment and management plan was discussed with the patient. The patient verbalized understanding of and has agreed to the management plan.   Benton LITTIE Gave, PA

## 2024-04-09 ENCOUNTER — Other Ambulatory Visit: Payer: Self-pay | Admitting: Urgent Care

## 2024-04-09 ENCOUNTER — Telehealth: Payer: Self-pay | Admitting: Medical-Surgical

## 2024-04-09 NOTE — Telephone Encounter (Signed)
 Copied from CRM [id number]. Topic: General - Other >> Apr 09, 2024  8:16 AM Suzette B wrote: Reason for CRM: Patient called from [phone] stated today she is going back to work after being ill for the last week. She  has requested that a physician's note is sent to her MyChart so that she can have proof for work that she was out ill. Patient states that her days out were from Monday July 7th 2025 to Monday July 14th 2025. If you have any questions for the patient please reach her at the above number.

## 2024-04-09 NOTE — Progress Notes (Signed)
 Work note provided.

## 2024-04-09 NOTE — Telephone Encounter (Signed)
 Yes, I will write the work note. No problem Thanks.

## 2024-04-17 ENCOUNTER — Encounter (HOSPITAL_BASED_OUTPATIENT_CLINIC_OR_DEPARTMENT_OTHER): Payer: Self-pay

## 2024-04-17 ENCOUNTER — Emergency Department (HOSPITAL_BASED_OUTPATIENT_CLINIC_OR_DEPARTMENT_OTHER): Payer: Self-pay

## 2024-04-17 ENCOUNTER — Emergency Department (HOSPITAL_BASED_OUTPATIENT_CLINIC_OR_DEPARTMENT_OTHER)
Admission: EM | Admit: 2024-04-17 | Discharge: 2024-04-17 | Disposition: A | Discharge status: Home / Self Care | Payer: Self-pay | Attending: Emergency Medicine | Admitting: Emergency Medicine

## 2024-04-17 ENCOUNTER — Other Ambulatory Visit: Payer: Self-pay

## 2024-04-17 ENCOUNTER — Other Ambulatory Visit (HOSPITAL_BASED_OUTPATIENT_CLINIC_OR_DEPARTMENT_OTHER): Payer: Self-pay

## 2024-04-17 DIAGNOSIS — R7989 Other specified abnormal findings of blood chemistry: Secondary | ICD-10-CM

## 2024-04-17 DIAGNOSIS — R079 Chest pain, unspecified: Secondary | ICD-10-CM | POA: Insufficient documentation

## 2024-04-17 DIAGNOSIS — R112 Nausea with vomiting, unspecified: Secondary | ICD-10-CM | POA: Insufficient documentation

## 2024-04-17 DIAGNOSIS — R42 Dizziness and giddiness: Secondary | ICD-10-CM | POA: Insufficient documentation

## 2024-04-17 DIAGNOSIS — R0602 Shortness of breath: Secondary | ICD-10-CM | POA: Insufficient documentation

## 2024-04-17 DIAGNOSIS — R202 Paresthesia of skin: Secondary | ICD-10-CM | POA: Insufficient documentation

## 2024-04-17 DIAGNOSIS — R7401 Elevation of levels of liver transaminase levels: Secondary | ICD-10-CM | POA: Insufficient documentation

## 2024-04-17 LAB — TROPONIN T, HIGH SENSITIVITY: Troponin T High Sensitivity: <6 ng/L (ref <19)

## 2024-04-17 LAB — D-DIMER, QUANTITATIVE: D-Dimer, Quant: <0.27 ug{FEU}/mL (ref 0.00–0.50)

## 2024-04-17 LAB — COMPREHENSIVE METABOLIC PANEL WITH GFR
ALT: 61 U/L — ABNORMAL HIGH (ref 0–44)
AST: 55 U/L — ABNORMAL HIGH (ref 15–41)
Albumin: 5.6 g/dL — ABNORMAL HIGH (ref 3.5–5.0)
Alkaline Phosphatase: 102 U/L (ref 38–126)
Anion gap: 21 — ABNORMAL HIGH (ref 5–15)
BUN: 8 mg/dL (ref 6–20)
CO2: 23 mmol/L (ref 22–32)
Calcium: 10.8 mg/dL — ABNORMAL HIGH (ref 8.9–10.3)
Chloride: 95 mmol/L — ABNORMAL LOW (ref 98–111)
Creatinine, Ser: 1.01 mg/dL — ABNORMAL HIGH (ref 0.44–1.00)
GFR, Estimated: >60 mL/min (ref >60)
Glucose, Bld: 76 mg/dL (ref 70–99)
Potassium: 3.9 mmol/L (ref 3.5–5.1)
Sodium: 139 mmol/L (ref 135–145)
Total Bilirubin: 2.9 mg/dL — ABNORMAL HIGH (ref 0.0–1.2)
Total Protein: 8.8 g/dL — ABNORMAL HIGH (ref 6.5–8.1)

## 2024-04-17 LAB — CBC
HCT: 48.3 % — ABNORMAL HIGH (ref 36.0–46.0)
Hemoglobin: 17.4 g/dL — ABNORMAL HIGH (ref 12.0–15.0)
MCH: 33.7 pg (ref 26.0–34.0)
MCHC: 36 g/dL (ref 30.0–36.0)
MCV: 93.6 fL (ref 80.0–100.0)
Platelets: 297 K/uL (ref 150–400)
RBC: 5.16 MIL/uL — ABNORMAL HIGH (ref 3.87–5.11)
RDW: 13.5 % (ref 11.5–15.5)
WBC: 6.4 K/uL (ref 4.0–10.5)
nRBC: 0 % (ref 0.0–0.2)

## 2024-04-17 LAB — LIPASE, BLOOD: Lipase: 26 U/L (ref 11–51)

## 2024-04-17 MED ORDER — SODIUM CHLORIDE 0.9 % IV SOLN
25.0000 mg | Freq: Once | INTRAVENOUS | Status: AC
  Administered 2024-04-17: 25 mg via INTRAVENOUS
  Filled 2024-04-17: qty 1

## 2024-04-17 MED ORDER — LORAZEPAM 0.5 MG PO TABS
1.0000 mg | ORAL_TABLET | Freq: Three times a day (TID) | ORAL | 0 refills | Status: DC | PRN
  Filled 2024-04-17: qty 12, 2d supply, fill #0

## 2024-04-17 MED ORDER — ASPIRIN 81 MG PO CHEW
324.0000 mg | CHEWABLE_TABLET | Freq: Once | ORAL | Status: AC
  Administered 2024-04-17: 324 mg via ORAL
  Filled 2024-04-17: qty 4

## 2024-04-17 MED ORDER — ONDANSETRON HCL 4 MG/2ML IJ SOLN
4.0000 mg | Freq: Once | INTRAMUSCULAR | Status: AC
  Administered 2024-04-17: 4 mg via INTRAVENOUS
  Filled 2024-04-17: qty 2

## 2024-04-17 MED ORDER — PROMETHAZINE HCL 25 MG/ML IJ SOLN
INTRAMUSCULAR | Status: AC
  Filled 2024-04-17: qty 1

## 2024-04-17 MED ORDER — PANTOPRAZOLE SODIUM 40 MG PO TBEC
40.0000 mg | DELAYED_RELEASE_TABLET | Freq: Every day | ORAL | 0 refills | Status: DC
Start: 2024-04-17 — End: 2024-05-16
  Filled 2024-04-17: qty 30, 30d supply, fill #0

## 2024-04-17 MED ORDER — LORAZEPAM 2 MG/ML IJ SOLN
1.0000 mg | Freq: Once | INTRAMUSCULAR | Status: AC
  Administered 2024-04-17: 1 mg via INTRAVENOUS
  Filled 2024-04-17: qty 1

## 2024-04-17 NOTE — ED Notes (Signed)
 Patient transported to [place]

## 2024-04-17 NOTE — ED Triage Notes (Addendum)
 Pt having left sided chest pressure that started last night around 7 pm, tingling in fingertips bilaterally, dizziness, difficulty breathing, and blurry vision. Chest pain does radiate up toward jaw and down arm. Pt still having all of these symptoms. 6/10 chest pressure. Pt tearful in triage. No hx of cardiac issues, no known family hx of cardiac problems.  Aleck FORBES Molt, RN

## 2024-04-17 NOTE — ED Provider Notes (Signed)
 [place] [facility] Provider Note   CSN: [id number] Arrival date & time: 04/17/24  9250     Patient presents with: Chest Pain   Amanda Vazquez is a 33 y.o. female.  She has a history of migraine, endometriosis, polycystic ovary disease.  Complaining of left-sided chest pain radiating to her left arm that started last evening at rest.  Also endorses tingling in her hands, dizziness, difficulty breathing, blurry vision.  Denies prior history of the symptoms.  No history of cardiac disease no family history.  Denies tobacco use or cocaine use.  Has tried nothing for her symptoms   The history is provided by the patient.  Chest Pain Pain location:  L chest Pain radiates to:  L shoulder and neck Pain severity:  Moderate Onset quality:  Gradual Duration:  15 hours Timing:  Constant Progression:  Unchanged Chronicity:  New Relieved by:  None tried Worsened by:  Nothing Ineffective treatments:  None tried Associated symptoms: dizziness, nausea, numbness, shortness of breath and weakness   Associated symptoms: no abdominal pain, no cough, no diaphoresis, no fever and no vomiting   Risk factors: no prior DVT/PE and no smoking        Prior to Admission medications   Medication Sig Start Date End Date Taking? Authorizing Provider  acetaminophen  (TYLENOL ) 500 MG tablet Take 500 mg by mouth every 6 (six) hours as needed.    [provider]  albuterol  (VENTOLIN  HFA) 108 (90 Base) MCG/ACT inhaler Inhale 1-2 puffs into the lungs every 6 (six) hours as needed for wheezing or shortness of breath. 05/23/22   Bevin Bernice RAMAN, DO  ALPRAZolam  (XANAX ) 0.5 MG tablet Take 0.5-1 tablets (0.25-0.5 mg total) by mouth 2 (two) times daily as needed. 04/01/24   Crain, [name] L, PA  buPROPion  (WELLBUTRIN  XL) 150 MG 24 hr tablet Take 1 tablet (150 mg total) by mouth every morning. 03/13/24   Crain, [name] L, PA  busPIRone  (BUSPAR ) 10 MG tablet Take 0.5 tablets (5  mg total) by mouth daily for 5 days, THEN 0.5 tablets (5 mg total) 2 (two) times daily for 5 days, THEN 1 tablet (10 mg total) every morning AND 0.5 tablets (5 mg) every evening for 5 days, THEN 1 tablet (10 mg total) 2 (two) times daily for 5 days. 04/01/24 07/15/24  Crain, [name] L, PA  estradiol  (ESTRACE ) 1 MG tablet Take 1 mg by mouth daily.    [provider]    Allergies: Buprenorphine    Review of Systems  Constitutional:  Negative for diaphoresis and fever.  Respiratory:  Positive for shortness of breath. Negative for cough.   Cardiovascular:  Positive for chest pain.  Gastrointestinal:  Positive for nausea. Negative for abdominal pain and vomiting.  Neurological:  Positive for dizziness, weakness and numbness.    Updated Vital Signs BP (!) 142/90 (BP Location: Right Arm)   Pulse 90   Temp 98.4 F (36.9 C) (Oral)   Resp 16   Ht 5' 2 (1.575 m)   Wt 52.2 kg   LMP 12/11/2014 (Approximate)   SpO2 100%   BMI 21.03 kg/m   Physical Exam Vitals and nursing note reviewed.  Constitutional:      General: She is not in acute distress.    Appearance: Normal appearance. She is well-developed.  HENT:     Head: Normocephalic and atraumatic.  Eyes:     Conjunctiva/sclera: Conjunctivae normal.  Cardiovascular:     Rate and Rhythm: Normal  rate and regular rhythm.     Heart sounds: Normal heart sounds. No murmur heard. Pulmonary:     Effort: Pulmonary effort is normal. No respiratory distress.     Breath sounds: Normal breath sounds. No stridor. No wheezing.  Abdominal:     Palpations: Abdomen is soft.     Tenderness: There is no abdominal tenderness. There is no guarding or rebound.  Musculoskeletal:        General: No tenderness or deformity.     Cervical back: Neck supple.     Right lower leg: No tenderness. No edema.     Left lower leg: No tenderness. No edema.  Skin:    General: Skin is warm and dry.  Neurological:     General: No focal deficit present.      Mental Status: She is alert.     GCS: GCS eye subscore is 4. GCS verbal subscore is 5. GCS motor subscore is 6.     (all labs ordered are listed, but only abnormal results are displayed) Labs Reviewed  CBC - Abnormal; Notable for the following components:      Result Value   RBC 5.16 (*)    Hemoglobin 17.4 (*)    HCT 48.3 (*)    All other components within normal limits  COMPREHENSIVE METABOLIC PANEL WITH GFR - Abnormal; Notable for the following components:   Chloride 95 (*)    Creatinine, Ser 1.01 (*)    Calcium 10.8 (*)    Total Protein 8.8 (*)    Albumin 5.6 (*)    AST 55 (*)    ALT 61 (*)    Total Bilirubin 2.9 (*)    Anion gap 21 (*)    All other components within normal limits  LIPASE, BLOOD  D-DIMER, QUANTITATIVE  TROPONIN T, HIGH SENSITIVITY    EKG: EKG Interpretation Date/Time:  Wednesday April 17 2024 08:02:52 EDT Ventricular Rate:  89 PR Interval:  131 QRS Duration:  75 QT Interval:  363 QTC Calculation: 442 R Axis:   75  Text Interpretation: Sinus rhythm Right atrial enlargement Minimal ST depression, diffuse leads No old tracing to compare Confirmed by Towana Sharper [phone]) on 04/17/2024 8:04:19 AM  Radiology: US  Abdomen Limited RUQ (LIVER/GB) Result Date: 04/17/2024 CLINICAL DATA:  Chest pain. EXAM: ULTRASOUND ABDOMEN LIMITED RIGHT UPPER QUADRANT COMPARISON:  None Available. FINDINGS: Gallbladder: No gallstones or wall thickening visualized. No sonographic Murphy sign noted by sonographer. Common bile duct: Diameter: 1.8 mm Liver: No focal lesion identified. Within normal limits in parenchymal echogenicity. Portal vein is patent on color Doppler imaging with normal direction of blood flow towards the liver. Other: None. IMPRESSION: No acute sonographic abnormality identified in the right upper quadrant. Electronically Signed   By: Harrietta Sherry M.D.   On: 04/17/2024 11:06   DG Chest Port 1 View Result Date: 04/17/2024 CLINICAL DATA:  Chest pain. EXAM:  PORTABLE CHEST 1 VIEW COMPARISON:  May 23, 2022. FINDINGS: The heart size and mediastinal contours are within normal limits. Both lungs are clear. The visualized skeletal structures are unremarkable. IMPRESSION: No active disease. Electronically Signed   By: Lynwood Landy Raddle M.D.   On: 04/17/2024 08:36     Procedures   Medications Ordered in the ED  aspirin  chewable tablet 324 mg (324 mg Oral Given 04/17/24 0812)  ondansetron  (ZOFRAN ) injection 4 mg (4 mg Intravenous Given 04/17/24 0813)  LORazepam  (ATIVAN ) injection 1 mg (1 mg Intravenous Given 04/17/24 0847)  promethazine  (PHENERGAN ) 25 mg in  sodium chloride  0.9 % 50 mL IVPB (0 mg Intravenous Stopped 04/17/24 1107)    Clinical Course as of 04/17/24 1635  Wed Apr 17, 2024  9164 Chest x-ray interpreted by radiologist no acute infiltrate.  Awaiting radiology reading. [MB]  [phone] Dimer and troponin negative.  Do not feel she needs a delta Trop.  Received some Zofran  and giving her some Ativan  as she is quite nauseous and very anxious. [MB]    Clinical Course User Index [MB] Towana Ozell BROCKS, MD                                 Medical Decision Making Amount and/or Complexity of Data Reviewed Labs: ordered. Radiology: ordered.  Risk OTC drugs. Prescription drug management.   This patient complains of chest pain shortness of breath nausea vomiting; this involves an extensive number of treatment Options and is a complaint that carries with it a high risk of complications and morbidity. The differential includes ACS, pneumonia, pneumothorax, PE, biliary colic, gastritis, anxiety  I ordered, reviewed and interpreted labs, which included CBC unremarkable, chemistries with elevated LFTs, troponin flat D-dimer normal I ordered medication IV nausea medication;  aspirin  anxiety medicine and reviewed PMP when indicated. I ordered imaging studies which included chest x-ray and right upper quadrant ultrasound and I independently    visualized  and interpreted imaging which showed no acute findings Additional history obtained from patient's mother Previous records obtained and reviewed in epic including prior ED visits Cardiac monitoring reviewed, normal sinus rhythm Social determinants considered, multiple barriers including depression stress financial insecurity Food insecurity Critical Interventions: None  After the interventions stated above, I reevaluated the patient and found patient's symptoms to be improved and tolerating p.o. Admission and further testing considered, no indications for admission.  Patient will need close follow-up with PCP.  Will start on PPI and patient asking for a small prescription for some medication for anxiety.  Return instructions discussed      Final diagnoses:  Nonspecific chest pain  Elevated LFTs    ED Discharge Orders          Ordered    LORazepam  (ATIVAN ) 0.5 MG tablet  Every 8 hours PRN        04/17/24 1120    pantoprazole  (PROTONIX ) 40 MG tablet  Daily        04/17/24 1120               Towana Ozell BROCKS, MD 04/17/24 1637

## 2024-04-17 NOTE — Discharge Instructions (Signed)
 You were seen in the emergency department for chest pain shortness of breath nausea.  You had workup including labs chest x-ray and right upper quadrant ultrasound that did not show an obvious explanation for your symptoms.  Your liver tests were mildly elevated but your ultrasound did not show any gallstones.  There was no evidence of cardiac injury.  We have started you on some acid medication and some medicine to help with anxiety.  Please follow-up with your primary care doctor for repeat blood work and reassessment.  Return if any worsening or concerning symptoms

## 2024-05-01 ENCOUNTER — Ambulatory Visit: Payer: Self-pay

## 2024-05-01 NOTE — Telephone Encounter (Signed)
 Pt has appointment scheduled with me tomorrow 05/02/24. Thanks

## 2024-05-01 NOTE — Telephone Encounter (Signed)
 FYI Only or Action Required?: Action required by provider: request for appointment.  Patient was last seen in primary care on 04/01/2024 by Lowella Benton CROME, PA.  Called Nurse Triage reporting Anxiety.  Symptoms began a week ago.  Interventions attempted: Prescription medications: scheduled anxiety medication.  Symptoms are: gradually worsening.  Triage Disposition: See Physician Within 24 Hours  Patient/caregiver understands and will follow disposition?: No, wishes to speak with PCP   Copied from CRM #[id number]. Topic: Clinical - Red Word Triage >> May 01, 2024  9:09 AM Miquel SAILOR wrote: Red Word that prompted transfer to Nurse Triage: Anexity issue/first week of July after grandma and dog passed away/Been happening  more this week the it has been Reason for Disposition  Patient sounds very upset or troubled to the triager  Answer Assessment - Initial Assessment Questions 1. CONCERN: Did anything happen that prompted you to call today?      Death r/t family and dog 2. ANXIETY SYMPTOMS: Can you describe how you (your loved one; patient) have been feeling? (e.g., tense, restless, panicky, anxious, keyed up, overwhelmed, sense of impending doom).      Overwhelmed, SOB, HR racing, anxious, sense of impending doom 3. ONSET: How long have you been feeling this way? (e.g., hours, days, weeks)     Ongoing and worsening x week 4. SEVERITY: How would you rate the level of anxiety? (e.g., 0 - 10; or mild, moderate, severe).     severe 5. FUNCTIONAL IMPAIRMENT: How have these feelings affected your ability to do daily activities? Have you had more difficulty than usual doing your normal daily activities? (e.g., getting better, same, worse; self-care, school, work, interactions)     yes 6. HISTORY: Have you felt this way before? Have you ever been diagnosed with an anxiety problem in the past? (e.g., generalized anxiety disorder, panic attacks, PTSD). If Yes, ask: How was this problem  treated? (e.g., medicines, counseling, etc.)     yes 7. RISK OF HARM - SUICIDAL IDEATION: Do you ever have thoughts of hurting or killing yourself? If Yes, ask:  Do you have these feelings now? Do you have a plan on how you would do this?     no 8. TREATMENT:  What has been done so far to treat this anxiety? (e.g., medicines, relaxation strategies). What has helped?    Taking rx anxiety medications - does not have any PRN medication 9. THERAPIST: Do you have a counselor or therapist? If Yes, ask: What is their name?     no 10. POTENTIAL TRIGGERS: Do you drink caffeinated beverages (e.g., coffee, colas, teas), and how much daily? Do you drink alcohol or use any drugs? Have you started any new medicines recently?       na 11. PATIENT SUPPORT: Who is with you now? Who do you live with? Do you have family or friends who you can talk to?        yes 12. OTHER SYMPTOMS: Do you have any other symptoms? (e.g., feeling depressed, trouble concentrating, trouble sleeping, trouble breathing, palpitations or fast heartbeat, chest pain, sweating, nausea, or diarrhea)       nausea 13. PREGNANCY: Is there any chance you are pregnant? When was your last menstrual period?       Na  Pt has suffered two deaths recently and is having increased anxiety: pt would like something to help with this.  Need to see Benton Lowella - asap.  Pt would like to worked into [employer] schedule - stated  she has been helping pt in the past.  Please call pt  Protocols used: Anxiety and Panic Attack-A-AH

## 2024-05-02 ENCOUNTER — Other Ambulatory Visit (HOSPITAL_BASED_OUTPATIENT_CLINIC_OR_DEPARTMENT_OTHER): Payer: Self-pay

## 2024-05-02 ENCOUNTER — Telehealth (INDEPENDENT_AMBULATORY_CARE_PROVIDER_SITE_OTHER): Payer: Self-pay | Admitting: Urgent Care

## 2024-05-02 VITALS — Ht 62.0 in | Wt 115.0 lb

## 2024-05-02 DIAGNOSIS — R945 Abnormal results of liver function studies: Secondary | ICD-10-CM

## 2024-05-02 DIAGNOSIS — N289 Disorder of kidney and ureter, unspecified: Secondary | ICD-10-CM

## 2024-05-02 DIAGNOSIS — F430 Acute stress reaction: Secondary | ICD-10-CM

## 2024-05-02 DIAGNOSIS — F410 Panic disorder [episodic paroxysmal anxiety] without agoraphobia: Secondary | ICD-10-CM

## 2024-05-02 DIAGNOSIS — D751 Secondary polycythemia: Secondary | ICD-10-CM

## 2024-05-02 MED ORDER — ALPRAZOLAM 0.5 MG PO TABS
0.5000 mg | ORAL_TABLET | Freq: Two times a day (BID) | ORAL | 0 refills | Status: DC | PRN
  Filled 2024-05-02: qty 16, 8d supply, fill #0

## 2024-05-02 NOTE — Progress Notes (Unsigned)
 Spoke to patient concerning increasing anxiety. States it's been affecting her nearly everyday for the past 3 days.

## 2024-05-02 NOTE — Patient Instructions (Signed)
 Please increase your buspirone  dose. Tomorrow, start taking 10mg  in the morning, and continue with 1/2 tab at night. Do this for 5 nights. On 05/08/24, please increase to 10mg  twice daily of the buspirone .   Take this in addition to the Wellbutrin .  Only take xanax  as needed for panic attacks. Use with caution as this medication is sedating.  Please schedule a 2 week IN OFFICE follow up so that we can recheck your labs.

## 2024-05-03 ENCOUNTER — Encounter: Payer: Self-pay | Admitting: Urgent Care

## 2024-05-03 DIAGNOSIS — R945 Abnormal results of liver function studies: Secondary | ICD-10-CM | POA: Insufficient documentation

## 2024-05-03 DIAGNOSIS — D751 Secondary polycythemia: Secondary | ICD-10-CM | POA: Insufficient documentation

## 2024-05-03 DIAGNOSIS — N289 Disorder of kidney and ureter, unspecified: Secondary | ICD-10-CM | POA: Insufficient documentation

## 2024-05-03 NOTE — Assessment & Plan Note (Signed)
  1. Panic attack as reaction to stress - ALPRAZolam  (XANAX ) 0.5 MG tablet; Take 1 tablet (0.5 mg total) by mouth 2 (two) times daily as needed.

## 2024-05-03 NOTE — Assessment & Plan Note (Signed)
  1. Panic attack as reaction to stress - ALPRAZolam  (XANAX ) 0.5 MG tablet; Take 1 tablet (0.5 mg total) by mouth 2 (two) times daily as needed.  2. Erythrocytosis  3. Hypercalcemia  4. Abnormal renal function

## 2024-05-03 NOTE — Progress Notes (Signed)
 Virtual Visit via Video Note  I connected with [employer] on 05/03/24 at  2:00 PM EDT by a video enabled telemedicine application and verified that I am speaking with the correct person using two identifiers.  Patient Location: Home Provider Location: Office/Clinic  I discussed the limitations, risks, security, and privacy concerns of performing an evaluation and management service by video and the availability of in person appointments. I also discussed with the patient that there may be a patient responsible charge related to this service. The patient expressed understanding and agreed to proceed.   Subjective  PCP: Lowella Benton CROME, PA  Chief Complaint  Patient presents with   Anxiety    HPI:   Discussed the use of AI scribe software for clinical note transcription with the patient, who gave verbal consent to proceed.  History of Present Illness   Amanda Vazquez is a 32 year old female who presents with increased anxiety attacks following a recent stressor.  She has been experiencing increased anxiety attacks since last Monday, following the euthanasia of her dog of sixteen years. The anxiety attacks are described as 'spikes' that make her feel very overwhelmed, shaky, and nauseous, though she has not vomited. These episodes have been difficult to settle down.  She has been on Wellbutrin  for two months, which initially helped reduce the frequency of her anxiety episodes. She is also taking buspirone , currently at a dose of 5 mg twice a day, as she is cutting a 10 mg tablet in half.  She visited the ER on July 23rd due to similar symptoms (CP), where blood work showed elevated hemoglobin and calcium levels. No smoking. Pt was discharged on 1mg  lorazepam . She has since taken all of them, felt like ativan  worked better for her panic.     ROS: Per HPI. All other pertinent systems are negative.   Current Outpatient Medications:    acetaminophen  (TYLENOL ) 500 MG tablet,  Take 500 mg by mouth every 6 (six) hours as needed., Disp: , Rfl:    albuterol  (VENTOLIN  HFA) 108 (90 Base) MCG/ACT inhaler, Inhale 1-2 puffs into the lungs every 6 (six) hours as needed for wheezing or shortness of breath., Disp: 8 g, Rfl: 1   buPROPion  (WELLBUTRIN  XL) 150 MG 24 hr tablet, Take 1 tablet (150 mg total) by mouth every morning., Disp: 90 tablet, Rfl: 2   busPIRone  (BUSPAR ) 10 MG tablet, Take 0.5 tablets (5 mg total) by mouth daily for 5 days, THEN 0.5 tablets (5 mg total) 2 (two) times daily for 5 days, THEN 1 tablet (10 mg total) every morning AND 0.5 tablets (5 mg) every evening for 5 days, THEN 1 tablet (10 mg total) 2 (two) times daily for 5 days., Disp: 60 tablet, Rfl: 2   estradiol  (ESTRACE ) 1 MG tablet, Take 1 mg by mouth daily., Disp: , Rfl:    pantoprazole  (PROTONIX ) 40 MG tablet, Take 1 tablet (40 mg total) by mouth daily., Disp: 30 tablet, Rfl: 0   ALPRAZolam  (XANAX ) 0.5 MG tablet, Take 1 tablet (0.5 mg total) by mouth 2 (two) times daily as needed., Disp: 16 tablet, Rfl: 0    Objective  Vital signs not able to be obtained due to this being a virtual visit.  Physical Exam Vitals and nursing note reviewed.  Constitutional:      General: She is not in acute distress.    Appearance: Normal appearance. She is not ill-appearing, toxic-appearing or diaphoretic.  HENT:     Head: Normocephalic and  atraumatic.  Eyes:     General: No scleral icterus.       Right eye: No discharge.        Left eye: No discharge.     Extraocular Movements: Extraocular movements intact.     Pupils: Pupils are equal, round, and reactive to light.  Cardiovascular:     Rate and Rhythm: Normal rate.  Pulmonary:     Effort: Pulmonary effort is normal. No respiratory distress.  Skin:    General: Skin is warm and dry.     Coloration: Skin is not jaundiced.     Findings: No bruising, erythema or rash.  Neurological:     General: No focal deficit present.     Mental Status: She is alert and  oriented to person, place, and time.     Gait: Gait normal.  Psychiatric:        Mood and Affect: Mood normal.        Behavior: Behavior normal.      Assessment & Plan Panic attack as reaction to stress  1. Panic attack as reaction to stress - ALPRAZolam  (XANAX ) 0.5 MG tablet; Take 1 tablet (0.5 mg total) by mouth 2 (two) times daily as needed.  Erythrocytosis  1. Panic attack as reaction to stress - ALPRAZolam  (XANAX ) 0.5 MG tablet; Take 1 tablet (0.5 mg total) by mouth 2 (two) times daily as needed.  2. Erythrocytosis  Hypercalcemia  1. Panic attack as reaction to stress - ALPRAZolam  (XANAX ) 0.5 MG tablet; Take 1 tablet (0.5 mg total) by mouth 2 (two) times daily as needed.  2. Erythrocytosis  3. Hypercalcemia  Abnormal renal function  1. Panic attack as reaction to stress - ALPRAZolam  (XANAX ) 0.5 MG tablet; Take 1 tablet (0.5 mg total) by mouth 2 (two) times daily as needed.  2. Erythrocytosis  3. Hypercalcemia  4. Abnormal renal function  Abnormal liver function  1. Panic attack as reaction to stress - ALPRAZolam  (XANAX ) 0.5 MG tablet; Take 1 tablet (0.5 mg total) by mouth 2 (two) times daily as needed.  2. Erythrocytosis  3. Hypercalcemia  4. Abnormal renal function  5. Abnormal liver function  Pt continues to have panic attacks. In light of her recent ER labs, discussed with pt that we need to recheck these to ensure hyperparathyroid or another metabolic derangement is not the cause of her current issues. Pt states understanding and will return for an in office visit in 2 weeks for lab recheck.  For now, will have pt continue up-titration of buspirone  until goal dose of 10mg  twice daily. Will add xanax  PRN. Pt understands this is not to be a long-term medication.  No follow-ups on file.   I discussed the assessment and treatment plan with the patient. The patient was provided an opportunity to ask questions, and all were answered. The patient agreed  with the plan and demonstrated an understanding of the instructions.   The patient was advised to call back or seek an in-person evaluation if the symptoms worsen or if the condition fails to improve as anticipated.  The above assessment and management plan was discussed with the patient. The patient verbalized understanding of and has agreed to the management plan.   Benton LITTIE Gave, PA

## 2024-05-03 NOTE — Assessment & Plan Note (Signed)
  1. Panic attack as reaction to stress - ALPRAZolam  (XANAX ) 0.5 MG tablet; Take 1 tablet (0.5 mg total) by mouth 2 (two) times daily as needed.  2. Erythrocytosis  3. Hypercalcemia  4. Abnormal renal function  5. Abnormal liver function  Pt continues to have panic attacks. In light of her recent ER labs, discussed with pt that we need to recheck these to ensure hyperparathyroid or another metabolic derangement is not the cause of her current issues. Pt states understanding and will return for an in office visit in 2 weeks for lab recheck.  For now, will have pt continue up-titration of buspirone  until goal dose of 10mg  twice daily. Will add xanax  PRN. Pt understands this is not to be a long-term medication.

## 2024-05-03 NOTE — Assessment & Plan Note (Signed)
  1. Panic attack as reaction to stress - ALPRAZolam  (XANAX ) 0.5 MG tablet; Take 1 tablet (0.5 mg total) by mouth 2 (two) times daily as needed.  2. Erythrocytosis

## 2024-05-03 NOTE — Assessment & Plan Note (Signed)
  1. Panic attack as reaction to stress - ALPRAZolam  (XANAX ) 0.5 MG tablet; Take 1 tablet (0.5 mg total) by mouth 2 (two) times daily as needed.  2. Erythrocytosis  3. Hypercalcemia

## 2024-05-15 ENCOUNTER — Other Ambulatory Visit (HOSPITAL_BASED_OUTPATIENT_CLINIC_OR_DEPARTMENT_OTHER): Payer: Self-pay

## 2024-05-15 MED ORDER — ALPRAZOLAM 0.5 MG PO TABS
0.5000 mg | ORAL_TABLET | Freq: Two times a day (BID) | ORAL | 0 refills | Status: DC | PRN
Start: 2024-05-15 — End: 2024-05-23
  Filled 2024-05-15: qty 12, 6d supply, fill #0

## 2024-05-16 ENCOUNTER — Ambulatory Visit (INDEPENDENT_AMBULATORY_CARE_PROVIDER_SITE_OTHER): Payer: Self-pay | Admitting: Urgent Care

## 2024-05-16 ENCOUNTER — Encounter: Payer: Self-pay | Admitting: Urgent Care

## 2024-05-16 ENCOUNTER — Ambulatory Visit: Payer: Self-pay

## 2024-05-16 ENCOUNTER — Other Ambulatory Visit (HOSPITAL_BASED_OUTPATIENT_CLINIC_OR_DEPARTMENT_OTHER): Payer: Self-pay

## 2024-05-16 VITALS — BP 108/71 | HR 83 | Resp 18 | Ht 62.0 in | Wt 104.8 lb

## 2024-05-16 DIAGNOSIS — D751 Secondary polycythemia: Secondary | ICD-10-CM

## 2024-05-16 DIAGNOSIS — F430 Acute stress reaction: Secondary | ICD-10-CM

## 2024-05-16 DIAGNOSIS — R945 Abnormal results of liver function studies: Secondary | ICD-10-CM

## 2024-05-16 DIAGNOSIS — N289 Disorder of kidney and ureter, unspecified: Secondary | ICD-10-CM

## 2024-05-16 DIAGNOSIS — E0590 Thyrotoxicosis, unspecified without thyrotoxic crisis or storm: Secondary | ICD-10-CM

## 2024-05-16 DIAGNOSIS — E041 Nontoxic single thyroid nodule: Secondary | ICD-10-CM

## 2024-05-16 DIAGNOSIS — R634 Abnormal weight loss: Secondary | ICD-10-CM

## 2024-05-16 DIAGNOSIS — R1310 Dysphagia, unspecified: Secondary | ICD-10-CM

## 2024-05-16 DIAGNOSIS — F410 Panic disorder [episodic paroxysmal anxiety] without agoraphobia: Secondary | ICD-10-CM

## 2024-05-16 DIAGNOSIS — Z634 Disappearance and death of family member: Secondary | ICD-10-CM

## 2024-05-16 MED ORDER — HYDROXYZINE PAMOATE 25 MG PO CAPS
ORAL_CAPSULE | ORAL | 0 refills | Status: AC
  Filled 2024-05-16: qty 30, 7d supply, fill #0

## 2024-05-16 NOTE — Progress Notes (Signed)
 Established Patient Office Visit  Subjective:  Patient ID: Amanda Vazquez, female    DOB: Sep 14, 1991  Age: 33 y.o. MRN: [id number]  Chief Complaint  Patient presents with   Medical Management of Chronic Issues    2 wk anxiety and depression f/u w drug test     HPI  Discussed the use of AI scribe software for clinical note transcription with the patient, who gave verbal consent to proceed.  History of Present Illness   Amanda Vazquez is a 33 year old female who presents with anxiety and elevated calcium levels.  She has been experiencing significant anxiety symptoms over the past two to three weeks, with a marked increase in severity over the past six days. Symptoms include palpitations, an inability to calm down, focus, and experiencing tingling and tremors in her hands. She wakes up in the middle of the night in a panic and has episodes of anxiety during the day without any apparent triggers.  She started taking Wellbutrin  and Buspirone , which initially helped, but her symptoms have worsened recently. She is currently taking 150 mg of bupropion  and half a tablet of Buspirone  twice a day. She also uses Xanax  as needed and has been taking two tablets a day due to increased anxiety. She has not increased her Buspirone  dose.  She has a history of significant weight fluctuations, notably losing 40 pounds after contracting COVID-19 a couple of years ago. Her weight has been unstable since, and she weighed 104 pounds at this visit, indicating an 11-pound loss in the past month. Her weight fluctuates between 110 and 147 pounds.  Her family history includes thyroid  issues on both her mother's and father's sides, with her mother having had a thyroid  nodule removed and her cousin and grandmother having thyroid  conditions. She has a history of elevated calcium levels, with recent labs showing higher than normal levels.  She had a hysterectomy in 2017 due to ovarian cysts, initially having a  partial fibroctomy on the left side, which later affected the right side, leading to the hysterectomy. She is currently on estradiol  prescribed by her OBGYN.  She does not smoke and denies any history of sleep apnea or snoring. She works at a [employer], currently Customer service manager work, and took the day off for this appointment.       Patient Active Problem List   Diagnosis Date Noted   Erythrocytosis 05/03/2024   Abnormal liver function 05/03/2024   Hypercalcemia 05/03/2024   Abnormal renal function 05/03/2024   Nausea 05/24/2022   Pain of right sacroiliac joint 07/17/2020   Insomnia 06/05/2019   Intractable nausea and vomiting 01/09/2019   Poor venous access 01/07/2019   Uncomplicated opioid dependence (HCC) 05/26/2017   Irritable bowel syndrome 01/27/2017   Generalized anxiety disorder 12/26/2016   Major depressive disorder, recurrent episode, moderate (HCC) 12/26/2016   Panic attack as reaction to stress 12/26/2016   Surgical menopause 11/14/2016   Dyspareunia, female 11/14/2016   Headache disorder 11/14/2016   Chronic pelvic pain in female 03/26/2015   Chronic pain syndrome 03/26/2015   History of left salpingo-oophorectomy 03/26/2015   Lumbago 07/04/2014   Past Medical History:  Diagnosis Date   Common migraine with intractable migraine 12/16/2016   Endometriosis    Frequency of urination    Migraine    Pelvic pain in female    Polycystic ovary disease    Past Surgical History:  Procedure Laterality Date   ABDOMINAL HYSTERECTOMY     DX LAPAROSCOPY W/ LASER  ABLATION OF ENDOMETRIOSIS  06-12-2014   [facility]   LAPAROSCOPIC OVARIAN CYSTECTOMY Left 01/08/2015   Procedure: LAPAROSCOPIC OVARIAN CYSTECTOMY;  Surgeon: Norleen Skill, MD;  Location: [facility];  Service: Gynecology;  Laterality: Left;   LAPAROSCOPY N/A 01/08/2015   Procedure: LAPAROSCOPY DIAGNOSTIC;  Surgeon: Norleen Skill, MD;  Location: [facility];  Service:  Gynecology;  Laterality: N/A;   TONSILLECTOMY  age 73   Social History   Tobacco Use   Smoking status: Never   Smokeless tobacco: Never  Substance Use Topics   Alcohol use: Yes    Comment: occasional   Drug use: No      ROS: as noted in HPI  Objective:     BP 108/71   Pulse 83   Resp 18   Ht 5' 2 (1.575 m)   Wt 104 lb 12 oz (47.5 kg)   LMP 12/11/2014 (Approximate)   SpO2 96%   BMI 19.16 kg/m  BP Readings from Last 3 Encounters:  05/16/24 108/71  04/17/24 107/80  11/13/23 127/82   Wt Readings from Last 3 Encounters:  05/16/24 104 lb 12 oz (47.5 kg)  05/02/24 115 lb (52.2 kg)  04/17/24 115 lb (52.2 kg)      Physical Exam Vitals and nursing note reviewed.  Constitutional:      General: She is not in acute distress.    Appearance: Normal appearance. She is not ill-appearing, toxic-appearing or diaphoretic.  HENT:     Head: Normocephalic and atraumatic.     Right Ear: External ear normal.     Left Ear: External ear normal.     Mouth/Throat:     Mouth: Mucous membranes are moist.     Pharynx: No oropharyngeal exudate or posterior oropharyngeal erythema.  Eyes:     General: No scleral icterus.       Right eye: No discharge.        Left eye: No discharge.     Extraocular Movements: Extraocular movements intact.     Pupils: Pupils are equal, round, and reactive to light.  Neck:     Thyroid : Thyroid  mass (possible small nodule on R) present. No thyroid  tenderness.  Cardiovascular:     Rate and Rhythm: Normal rate and regular rhythm.     Heart sounds: No murmur heard. Pulmonary:     Effort: Pulmonary effort is normal. No respiratory distress.     Breath sounds: Normal breath sounds. No stridor. No wheezing or rhonchi.  Musculoskeletal:     Cervical back: Normal range of motion and neck supple. No rigidity or tenderness.  Lymphadenopathy:     Cervical: No cervical adenopathy.  Skin:    General: Skin is warm and dry.     Coloration: Skin is not jaundiced.      Findings: No bruising, erythema or rash.  Neurological:     General: No focal deficit present.     Mental Status: She is alert and oriented to person, place, and time.     Gait: Gait normal.  Psychiatric:        Mood and Affect: Mood normal.        Behavior: Behavior normal.      No results found for any visits on 05/16/24.  Last CBC Lab Results  Component Value Date   WBC 6.4 04/17/2024   HGB 17.4 (H) 04/17/2024   HCT 48.3 (H) 04/17/2024   MCV 93.6 04/17/2024   MCH 33.7 04/17/2024   RDW 13.5 04/17/2024  PLT 297 04/17/2024   Last metabolic panel Lab Results  Component Value Date   GLUCOSE 76 04/17/2024   NA 139 04/17/2024   K 3.9 04/17/2024   CL 95 (L) 04/17/2024   CO2 23 04/17/2024   BUN 8 04/17/2024   CREATININE 1.01 (H) 04/17/2024   GFRNONAA >60 04/17/2024   CALCIUM 10.8 (H) 04/17/2024   PROT 8.8 (H) 04/17/2024   ALBUMIN 5.6 (H) 04/17/2024   BILITOT 2.9 (H) 04/17/2024   ALKPHOS 102 04/17/2024   AST 55 (H) 04/17/2024   ALT 61 (H) 04/17/2024   ANIONGAP 21 (H) 04/17/2024   Last lipids No results found for: CHOL, HDL, LDLCALC, LDLDIRECT, TRIG, CHOLHDL Last hemoglobin A1c Lab Results  Component Value Date   HGBA1C 4.9 11/10/2022   Last thyroid  functions Lab Results  Component Value Date   TSH 0.98 11/10/2022   Last vitamin D No results found for: 25OHVITD2, 25OHVITD3, VD25OH Last vitamin B12 and Folate No results found for: VITAMINB12, FOLATE    The ASCVD Risk score (Arnett DK, et al., 2019) failed to calculate for the following reasons:   The 2019 ASCVD risk score is only valid for ages 83 to 23  Assessment & Plan:  Panic attack as reaction to stress -     hydrOXYzine  Pamoate; Take one capsule by mouth three times daily as needed for anxiety; can take two caps prior to bed.  Dispense: 30 capsule; Refill: 0  Erythrocytosis -     CBC with Differential/Platelet -     B12 and Folate Panel  Hypercalcemia -      CMP14+EGFR -     PTH, intact and calcium  Bereavement  Abnormal renal function -     CMP14+EGFR  Abnormal liver function -     CMP14+EGFR  Unintended weight loss -     TSH + free T4 -     Thyroid  stimulating immunoglobulin -     US  THYROID ; Future  Dysphagia, unspecified type -     US  THYROID ; Future  Assessment and Plan    Anxiety with panic attacks Worsening anxiety with severe panic attacks. Possible link to hypercalcemia or hyperthyroidism. - Prescribed hydroxyzine  for panic attacks and sleep: 1 capsule during panic attack, 2 capsules before bed. - Continue bupropion  150 mg and buspirone  5 mg half tablet twice daily. - Communicate via MyChart to assess hydroxyzine  effectiveness.  Unintentional weight loss Significant weight loss from 115 lbs to 104 lbs in one month. Possible hyperthyroidism. - Order thyroid  function tests. - Perform thyroid  ultrasound.  Hypercalcemia Elevated calcium levels with history of slight elevation. Possible hyperparathyroidism. - Order parathyroid hormone (PTH) test. - Recheck calcium levels.  Elevated hemoglobin (erythrocytosis) Elevated hemoglobin levels noted from ER labs; Hgb trending upwards over 5 years. - Recheck complete blood count (CBC). - Evaluate potential causes based on results.  Asthma Managed with Ventolin  inhaler as needed. - Continue Ventolin  inhaler as needed.  General Health Maintenance On estradiol  therapy post-hysterectomy. No need for progesterone. - Continue estradiol  therapy as prescribed.         Return in about 3 weeks (around 06/06/2024).   Benton LITTIE Gave, PA

## 2024-05-16 NOTE — Patient Instructions (Signed)
 Continue your home medicaitons as ordered. Please add hydroxyzine  to help with both sleep and panic attacks. We drew labs to assess the cause of your symptoms.  Please go to [address] to schedule an ultrasound of your thyroid .  Return in 3 weeks for follow up

## 2024-05-17 ENCOUNTER — Other Ambulatory Visit: Payer: Self-pay

## 2024-05-18 ENCOUNTER — Ambulatory Visit: Payer: Self-pay | Admitting: Urgent Care

## 2024-05-18 DIAGNOSIS — R634 Abnormal weight loss: Secondary | ICD-10-CM

## 2024-05-18 DIAGNOSIS — R1310 Dysphagia, unspecified: Secondary | ICD-10-CM

## 2024-05-18 DIAGNOSIS — E538 Deficiency of other specified B group vitamins: Secondary | ICD-10-CM

## 2024-05-18 DIAGNOSIS — F410 Panic disorder [episodic paroxysmal anxiety] without agoraphobia: Secondary | ICD-10-CM

## 2024-05-18 DIAGNOSIS — R945 Abnormal results of liver function studies: Secondary | ICD-10-CM

## 2024-05-18 LAB — CBC WITH DIFFERENTIAL/PLATELET
Basophils Absolute: 0 x10E3/uL (ref 0.0–0.2)
Basos: 1 %
EOS (ABSOLUTE): 0.1 x10E3/uL (ref 0.0–0.4)
Eos: 2 %
Hematocrit: 43 % (ref 34.0–46.6)
Hemoglobin: 14.6 g/dL (ref 11.1–15.9)
Immature Grans (Abs): 0 x10E3/uL (ref 0.0–0.1)
Immature Granulocytes: 0 %
Lymphocytes Absolute: 1.8 x10E3/uL (ref 0.7–3.1)
Lymphs: 34 %
MCH: 34.6 pg — ABNORMAL HIGH (ref 26.6–33.0)
MCHC: 34 g/dL (ref 31.5–35.7)
MCV: 102 fL — ABNORMAL HIGH (ref 79–97)
Monocytes Absolute: 0.3 x10E3/uL (ref 0.1–0.9)
Monocytes: 6 %
Neutrophils Absolute: 3.1 x10E3/uL (ref 1.4–7.0)
Neutrophils: 57 %
Platelets: 279 x10E3/uL (ref 150–450)
RBC: 4.22 x10E6/uL (ref 3.77–5.28)
RDW: 14.8 % (ref 11.7–15.4)
WBC: 5.3 x10E3/uL (ref 3.4–10.8)

## 2024-05-18 LAB — TSH+FREE T4
Free T4: 0.96 ng/dL (ref 0.82–1.77)
TSH: 1.35 u[IU]/mL (ref 0.450–4.500)

## 2024-05-18 LAB — CMP14+EGFR
ALT: 69 IU/L — ABNORMAL HIGH (ref 0–32)
AST: 67 IU/L — ABNORMAL HIGH (ref 0–40)
Albumin: 4.5 g/dL (ref 3.9–4.9)
Alkaline Phosphatase: 64 IU/L (ref 44–121)
BUN/Creatinine Ratio: 14 (ref 9–23)
BUN: 10 mg/dL (ref 6–20)
Bilirubin Total: 0.5 mg/dL (ref 0.0–1.2)
CO2: 21 mmol/L (ref 20–29)
Calcium: 9.5 mg/dL (ref 8.7–10.2)
Chloride: 102 mmol/L (ref 96–106)
Creatinine, Ser: 0.72 mg/dL (ref 0.57–1.00)
Globulin, Total: 2 g/dL (ref 1.5–4.5)
Glucose: 89 mg/dL (ref 70–99)
Potassium: 4.2 mmol/L (ref 3.5–5.2)
Sodium: 140 mmol/L (ref 134–144)
Total Protein: 6.5 g/dL (ref 6.0–8.5)
eGFR: 113 mL/min/1.73 (ref >59)

## 2024-05-18 LAB — B12 AND FOLATE PANEL
Folate: <2 ng/mL — ABNORMAL LOW (ref >3.0)
Vitamin B-12: 250 pg/mL (ref 232–1245)

## 2024-05-18 LAB — PTH, INTACT AND CALCIUM: PTH: 44 pg/mL (ref 15–65)

## 2024-05-18 LAB — THYROID STIMULATING IMMUNOGLOBULIN: Thyroid Stim Immunoglobulin: <0.1 IU/L (ref 0.00–0.55)

## 2024-05-20 ENCOUNTER — Telehealth: Payer: Self-pay

## 2024-05-20 MED ORDER — B-12 1000 MCG SL SUBL
1.0000 | SUBLINGUAL_TABLET | Freq: Every day | SUBLINGUAL | 1 refills | Status: DC
  Filled 2024-05-20: qty 90, fill #0

## 2024-05-20 MED ORDER — FOLIC ACID 1 MG PO TABS
5.0000 mg | ORAL_TABLET | Freq: Every day | ORAL | 11 refills | Status: AC
  Filled 2024-05-20: qty 150, 30d supply, fill #0

## 2024-05-20 NOTE — Telephone Encounter (Signed)
Labs not yet reviewed by provider

## 2024-05-20 NOTE — Telephone Encounter (Signed)
 Copied from CRM #[id number]. Topic: Clinical - Lab/Test Results >> May 20, 2024  1:22 PM Farrel B wrote: Reason for CRM: [phone], needing the to speak with someone in regards to lab results and if she unable to answer the call she stated to please leave a indepth message explaining the results and she will call back if she needs clarification on anything

## 2024-05-21 ENCOUNTER — Other Ambulatory Visit (HOSPITAL_BASED_OUTPATIENT_CLINIC_OR_DEPARTMENT_OTHER): Payer: Self-pay

## 2024-05-23 ENCOUNTER — Other Ambulatory Visit (HOSPITAL_BASED_OUTPATIENT_CLINIC_OR_DEPARTMENT_OTHER): Payer: Self-pay

## 2024-05-23 MED ORDER — CLONAZEPAM 0.5 MG PO TABS
0.5000 mg | ORAL_TABLET | Freq: Two times a day (BID) | ORAL | 1 refills | Status: DC | PRN
  Filled 2024-05-23: qty 30, 15d supply, fill #0
  Filled 2024-06-06: qty 30, 15d supply, fill #1

## 2024-05-23 NOTE — Addendum Note (Signed)
 Addended by: Cyanne Delmar on: 05/23/2024 02:56 PM   Modules accepted: Orders

## 2024-06-06 ENCOUNTER — Ambulatory Visit (INDEPENDENT_AMBULATORY_CARE_PROVIDER_SITE_OTHER): Payer: Self-pay | Admitting: Urgent Care

## 2024-06-06 ENCOUNTER — Encounter: Payer: Self-pay | Admitting: Urgent Care

## 2024-06-06 ENCOUNTER — Other Ambulatory Visit (HOSPITAL_BASED_OUTPATIENT_CLINIC_OR_DEPARTMENT_OTHER): Payer: Self-pay

## 2024-06-06 ENCOUNTER — Other Ambulatory Visit: Payer: Self-pay

## 2024-06-06 VITALS — BP 108/68 | HR 74 | Ht 62.0 in | Wt 118.0 lb

## 2024-06-06 DIAGNOSIS — E538 Deficiency of other specified B group vitamins: Secondary | ICD-10-CM

## 2024-06-06 DIAGNOSIS — R945 Abnormal results of liver function studies: Secondary | ICD-10-CM
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
## 2024-06-06 NOTE — Patient Instructions (Signed)
 Take your clonazepam  scheduled, twice daily. Try a 1/2 tablet instead of a full tablet.  Continue your wellbutrin  and buspar .  We will draw labs today to assess for possible causes of your continued symptoms.  Please follow up in 3 months

## 2024-06-07 ENCOUNTER — Ambulatory Visit: Payer: Self-pay | Admitting: Urgent Care

## 2024-06-07 ENCOUNTER — Ambulatory Visit: Payer: Self-pay

## 2024-06-07 LAB — HEPATIC FUNCTION PANEL
ALT: 94 IU/L — ABNORMAL HIGH (ref 0–32)
AST: 40 IU/L (ref 0–40)
Albumin: 4.7 g/dL (ref 3.9–4.9)
Alkaline Phosphatase: 71 IU/L (ref 44–121)
Bilirubin Total: 0.5 mg/dL (ref 0.0–1.2)
Bilirubin, Direct: 0.15 mg/dL (ref 0.00–0.40)
Total Protein: 6.9 g/dL (ref 6.0–8.5)

## 2024-06-07 LAB — ACUTE VIRAL HEPATITIS (HAV, HBV, HCV)
HCV Ab: NONREACTIVE
Hep A IgM: NEGATIVE
Hep B C IgM: NEGATIVE
Hepatitis B Surface Ag: NEGATIVE

## 2024-06-07 LAB — IRON,TIBC AND FERRITIN PANEL
Ferritin: 240 ng/mL — ABNORMAL HIGH (ref 15–150)
Iron Saturation: 27 % (ref 15–55)
Iron: 124 ug/dL (ref 27–159)
Total Iron Binding Capacity: 463 ug/dL — ABNORMAL HIGH (ref 250–450)
UIBC: 339 ug/dL (ref 131–425)

## 2024-06-07 LAB — ANTI-MICROSOMAL ANTIBODY LIVER / KIDNEY: LKM1 Ab: 0.7 U (ref 0.0–20.0)

## 2024-06-07 LAB — CERULOPLASMIN: Ceruloplasmin: 16.3 mg/dL — ABNORMAL LOW (ref 19.0–39.0)

## 2024-06-07 LAB — B12 AND FOLATE PANEL
Folate: 3.6 ng/mL (ref >3.0)
Vitamin B-12: 221 pg/mL — ABNORMAL LOW (ref 232–1245)

## 2024-06-07 LAB — ALPHA-1-ANTITRYPSIN: A-1 Antitrypsin: 144 mg/dL (ref 100–188)

## 2024-06-07 LAB — ANTI-SMOOTH MUSCLE ANTIBODY, IGG: Smooth Muscle Ab: 4 U (ref 0–19)

## 2024-06-07 LAB — HCV INTERPRETATION

## 2024-06-07 NOTE — Telephone Encounter (Signed)
 Amanda Vazquez states she needs pain medication to help with her ongoing abdominal pain that comes and goes. She has seen [name] for this problem and has been referred to GI. She called out of work today because she was in so much pain last night and had to have help standing.

## 2024-06-07 NOTE — Telephone Encounter (Signed)
  FYI Only or Action Required?: Action required by provider: clinical question for provider and update on patient condition. Requesting pain medication, and when to schedule b12 injection Patient was last seen in primary care on 06/06/2024 by Lowella Folks L, PA.  Called Nurse Triage reporting Abdominal Pain.  Symptoms began today.  Interventions attempted: OTC medications: advil , tylenol .  Symptoms are: unchanged.  Triage Disposition: See PCP Within 2 Weeks  Patient/caregiver understands and will follow disposition?: Yes  Copied from CRM [id number]. Topic: Clinical - Red Word Triage >> Jun 07, 2024 10:49 AM Graeme ORN wrote: Red Word that prompted transfer to Nurse Triage: Excruciating pain - Lower stomach goes around to lower back. Had to call out of work today. Requesting previous pain medication. Also has questions about lab results. Nothing is helping> Anxiety Reason for Disposition  Abdominal pain is a chronic symptom (recurrent or ongoing AND present > 4 weeks)  Answer Assessment - Initial Assessment Questions Patient would like medication to manage her GI Pain until she can be seen by GI specialist. She has take hydrocodone  and percocet in the past for pain which was helpful  Has been rotating tylenol  and ibuprofen  for pain, but it is not managing her symptoms.   Because her pain was not severe yesterday during her appointment, she forgot to ask.    1. LOCATION: Where does it hurt?      Lower left abdominal pain 2. RADIATION: Does the pain shoot anywhere else? (e.g., chest, back)     Radiates to back 3. ONSET: When did the pain begin? (e.g., minutes, hours or days ago)      One month, but increasing in frequency 4. SUDDEN: Gradual or sudden onset?     Pain increases suddenly 5. PATTERN Does the pain come and go, or is it constant?    Constant, but severity comes and goes 6. SEVERITY: How bad is the pain?  (e.g., Scale 1-10; mild, moderate, or severe)     9/10  when flared up 7. RECURRENT SYMPTOM: Have you ever had this type of stomach pain before? If Yes, ask: When was the last time? and What happened that time?      Prior to these symptoms which started one month ago, no. But states that the pain is similar to PCOS/cyst rupturing.  Patient had hysterectomy  8. CAUSE: What do you think is causing the stomach pain? (e.g., gallstones, recent abdominal surgery)     Unsure, but currently being followed for GI issues 9. RELIEVING/AGGRAVATING FACTORS: What makes it better or worse? (e.g., antacids, bending or twisting motion, bowel movement)     unknown 10. OTHER SYMPTOMS: Do you have any other symptoms? (e.g., back pain, diarrhea, fever, urination pain, vomiting)       denies 11. PREGNANCY: Is there any chance you are pregnant? When was your last menstrual period?       N/A, patient with hysterectomy  Protocols used: Abdominal Pain - Female-A-AH

## 2024-06-07 NOTE — Telephone Encounter (Signed)
 See telephone message. Patient was advised that no pain medication can be given.

## 2024-06-07 NOTE — Telephone Encounter (Signed)
 I would recommend she either use her Tylenol  or ibuprofen  heat and or ice, we can always refer her to pain management if she would like but we cannot provide narcotic type of pain medications.  If she is having severe pain then I would highly recommend that she go to the emergency room.

## 2024-06-07 NOTE — Telephone Encounter (Signed)
 Patient advised of recommendations. She was not happy that she is not going to be treated for the pain. I advised this is not a refill request and that Benton has never prescribed pain medications to her in the past. She states it is because the pain was controlled with Tylenol  and Ibuprofen . Last night the pain was severe. I advised her if the pain becomes severe again then she would need to go to the ED.

## 2024-06-12 ENCOUNTER — Ambulatory Visit: Payer: Self-pay

## 2024-07-04 ENCOUNTER — Telehealth: Payer: Self-pay | Admitting: Urgent Care

## 2024-07-04 DIAGNOSIS — F410 Panic disorder [episodic paroxysmal anxiety] without agoraphobia: Secondary | ICD-10-CM

## 2024-07-04 NOTE — Telephone Encounter (Signed)
 Copied from CRM #[id number]. Topic: Clinical - Medication Refill >> Jul 04, 2024  1:29 PM Delon HERO wrote: Medication: Rx #: [id number]  clonazePAM  (KLONOPIN ) 0.5 MG tablet [[id number]]    Has the patient contacted their pharmacy? Yes (Agent: If no, request that the patient contact the pharmacy for the refill. If patient does not wish to contact the pharmacy document the reason why and proceed with request.) (Agent: If yes, when and what did the pharmacy advise?)  This is the patient's preferred pharmacy:  [facility] [place] [facility] [address] [place] [address] Phone: [phone] Fax: [phone]  Is this the correct pharmacy for this prescription? Yes If no, delete pharmacy and type the correct one.   Has the prescription been filled recently? Yes  Is the patient out of the medication? Yes  Has the patient been seen for an appointment in the last year OR does the patient have an upcoming appointment? Yes  Can we respond through MyChart? Yes  Agent: Please be advised that Rx refills may take up to 3 business days. We ask that you follow-up with your pharmacy.

## 2024-07-08 ENCOUNTER — Other Ambulatory Visit: Payer: Self-pay | Admitting: Urgent Care

## 2024-07-08 ENCOUNTER — Other Ambulatory Visit: Payer: Self-pay

## 2024-07-08 ENCOUNTER — Other Ambulatory Visit (HOSPITAL_BASED_OUTPATIENT_CLINIC_OR_DEPARTMENT_OTHER): Payer: Self-pay

## 2024-07-08 DIAGNOSIS — F410 Panic disorder [episodic paroxysmal anxiety] without agoraphobia: Secondary | ICD-10-CM

## 2024-07-08 NOTE — Telephone Encounter (Signed)
 Patient is calling to check status of medication refill. She stated that she is completely out of meds at this time and is waiting for refill.   [facility] [address] [place] [address] Phone: [phone] Fax: [phone]

## 2024-07-09 ENCOUNTER — Other Ambulatory Visit (HOSPITAL_BASED_OUTPATIENT_CLINIC_OR_DEPARTMENT_OTHER): Payer: Self-pay

## 2024-07-09 ENCOUNTER — Telehealth: Payer: Self-pay

## 2024-07-09 MED ORDER — CLONAZEPAM 0.5 MG PO TABS
0.5000 mg | ORAL_TABLET | Freq: Two times a day (BID) | ORAL | 1 refills | Status: DC | PRN
  Filled 2024-07-09: qty 30, 15d supply, fill #0

## 2024-07-09 NOTE — Addendum Note (Signed)
 Addended by: Ata Pecha on: 07/09/2024 06:12 AM   Modules accepted: Orders

## 2024-07-09 NOTE — Telephone Encounter (Signed)
 Copied from CRM [id number]. Topic: Clinical - Prescription Issue >> Jul 09, 2024  8:07 AM Amanda Vazquez wrote: Reason for CRM: Prescription refill has been requested. Looks to be approved. Patient is completely out of medication. Provider has changed prescription to 2x per day, one in the morning and then one in the afternoon. Patient without dosage for two days. CB# [phone].

## 2024-07-16 ENCOUNTER — Encounter: Payer: Self-pay | Admitting: Urgent Care

## 2024-07-16 ENCOUNTER — Ambulatory Visit: Payer: Self-pay | Admitting: Urgent Care

## 2024-07-16 ENCOUNTER — Other Ambulatory Visit (HOSPITAL_BASED_OUTPATIENT_CLINIC_OR_DEPARTMENT_OTHER): Payer: Self-pay

## 2024-07-16 ENCOUNTER — Encounter (HOSPITAL_BASED_OUTPATIENT_CLINIC_OR_DEPARTMENT_OTHER): Payer: Self-pay

## 2024-07-16 VITALS — BP 104/65 | HR 65 | Ht 62.0 in | Wt 118.0 lb

## 2024-07-16 DIAGNOSIS — R7989 Other specified abnormal findings of blood chemistry: Secondary | ICD-10-CM

## 2024-07-16 DIAGNOSIS — N289 Disorder of kidney and ureter, unspecified: Secondary | ICD-10-CM

## 2024-07-16 DIAGNOSIS — F410 Panic disorder [episodic paroxysmal anxiety] without agoraphobia: Secondary | ICD-10-CM

## 2024-07-16 DIAGNOSIS — F411 Generalized anxiety disorder: Secondary | ICD-10-CM

## 2024-07-16 DIAGNOSIS — R945 Abnormal results of liver function studies: Secondary | ICD-10-CM

## 2024-07-16 DIAGNOSIS — R790 Abnormal level of blood mineral: Secondary | ICD-10-CM

## 2024-07-16 DIAGNOSIS — F430 Acute stress reaction: Secondary | ICD-10-CM

## 2024-07-16 DIAGNOSIS — D531 Other megaloblastic anemias, not elsewhere classified: Secondary | ICD-10-CM

## 2024-07-16 DIAGNOSIS — D7589 Other specified diseases of blood and blood-forming organs: Secondary | ICD-10-CM

## 2024-07-16 MED ORDER — BUSPIRONE HCL 10 MG PO TABS
10.0000 mg | ORAL_TABLET | Freq: Three times a day (TID) | ORAL | 6 refills | Status: AC
Start: 2024-07-16 — End: ?
  Filled 2024-07-16 (×2): qty 90, 30d supply, fill #0

## 2024-07-16 MED ORDER — SYRINGE 2-3 ML 3 ML MISC
0 refills | Status: AC
  Filled 2024-07-16 (×2): qty 25, 25d supply, fill #0

## 2024-07-16 MED ORDER — CYANOCOBALAMIN 1000 MCG/ML IJ SOLN
1000.0000 ug | INTRAMUSCULAR | 1 refills | Status: DC
  Filled 2024-07-16 (×2): qty 4, 28d supply, fill #0

## 2024-07-16 MED ORDER — CLONAZEPAM 1 MG PO TABS
0.5000 mg | ORAL_TABLET | Freq: Two times a day (BID) | ORAL | 0 refills | Status: DC | PRN
  Filled 2024-07-16 (×2): qty 60, 30d supply, fill #0

## 2024-07-16 MED ORDER — BD SAFETYGLIDE NEEDLE 25G X 1" MISC
0 refills | Status: AC
  Filled 2024-07-16 (×2): qty 10, 10d supply, fill #0

## 2024-07-16 NOTE — Progress Notes (Signed)
 Established Patient Office Visit  Subjective:  Patient ID: Amanda Vazquez, female    DOB: 04/25/1991  Age: 33 y.o. MRN: [id number]  Chief Complaint  Patient presents with   Follow-up    anxiety    HPI  Discussed the use of AI scribe software for clinical note transcription with the patient, who gave verbal consent to proceed.  History of Present Illness   Amanda Vazquez is a 33 year old female who presents for a one month follow-up regarding abnormal labs.  She is following up one month after being informed of low vitamin B12 levels on her lab results. Initially, she started oral B12 supplements on May 16, 2024, but her levels remained low by June 06, 2024. Due to lack of insurance, she has been unable to afford weekly B12 shots at the clinic but continues to take oral supplements. Her insurance is expected to resume in mid-November.  She is currently taking folic acid  supplements. She also takes anxiety medication, including Klonopin , Buspar , and Wellbutrin . Klonopin  is taken at a dose of 0.5 mg, with a schedule that includes two tablets in the morning, one or half a tablet in the afternoon, and another at night to aid sleep. She reports improved anxiety management with this regimen but notes the need for multiple doses throughout the day.  She experiences a variable appetite, sometimes feeling hungry but unable to eat. Occasional abdominal cramping occurs, but there is no bloating. Sleep is improved with Klonopin , but she struggles to sleep through the night without it.  Her family history includes a cousin diagnosed with celiac disease three years ago, which is relevant given her current nutritional deficiencies.       Patient Active Problem List   Diagnosis Date Noted   Erythrocytosis 05/03/2024   Abnormal liver function 05/03/2024   Hypercalcemia 05/03/2024   Abnormal renal function 05/03/2024   Nausea 05/24/2022   Pain of right sacroiliac joint 07/17/2020    Insomnia 06/05/2019   Intractable nausea and vomiting 01/09/2019   Poor venous access 01/07/2019   Uncomplicated opioid dependence (HCC) 05/26/2017   Irritable bowel syndrome 01/27/2017   Generalized anxiety disorder 12/26/2016   Major depressive disorder, recurrent episode, moderate (HCC) 12/26/2016   Panic attack as reaction to stress 12/26/2016   Surgical menopause 11/14/2016   Dyspareunia, female 11/14/2016   Headache disorder 11/14/2016   Chronic pelvic pain in female 03/26/2015   Chronic pain syndrome 03/26/2015   History of left salpingo-oophorectomy 03/26/2015   Lumbago 07/04/2014   Past Medical History:  Diagnosis Date   Common migraine with intractable migraine 12/16/2016   Endometriosis    Frequency of urination    Migraine    Pelvic pain in female    Polycystic ovary disease    Past Surgical History:  Procedure Laterality Date   ABDOMINAL HYSTERECTOMY     DX LAPAROSCOPY W/ LASER ABLATION OF ENDOMETRIOSIS  06-12-2014   [facility]   LAPAROSCOPIC OVARIAN CYSTECTOMY Left 01/08/2015   Procedure: LAPAROSCOPIC OVARIAN CYSTECTOMY;  Surgeon: Norleen Skill, MD;  Location: [facility];  Service: Gynecology;  Laterality: Left;   LAPAROSCOPY N/A 01/08/2015   Procedure: LAPAROSCOPY DIAGNOSTIC;  Surgeon: Norleen Skill, MD;  Location: [facility];  Service: Gynecology;  Laterality: N/A;   TONSILLECTOMY  age 73   Social History   Tobacco Use   Smoking status: Never   Smokeless tobacco: Never  Substance Use Topics   Alcohol use: Yes    Comment: occasional  Drug use: No      ROS: as noted in HPI  Objective:     BP 104/65   Pulse 65   Ht 5' 2 (1.575 m)   Wt 118 lb (53.5 kg)   LMP 12/11/2014 (Approximate)   SpO2 100%   BMI 21.58 kg/m  BP Readings from Last 3 Encounters:  07/16/24 104/65  06/06/24 108/68  05/16/24 108/71   Wt Readings from Last 3 Encounters:  07/16/24 118 lb (53.5 kg)  06/06/24 118 lb (53.5 kg)   05/16/24 104 lb 12 oz (47.5 kg)      Physical Exam Vitals and nursing note reviewed.  Constitutional:      General: She is not in acute distress.    Appearance: Normal appearance. She is not ill-appearing, toxic-appearing or diaphoretic.  HENT:     Head: Normocephalic and atraumatic.     Mouth/Throat:     Mouth: Mucous membranes are moist.  Eyes:     General: No scleral icterus.       Right eye: No discharge.        Left eye: No discharge.     Extraocular Movements: Extraocular movements intact.     Pupils: Pupils are equal, round, and reactive to light.  Cardiovascular:     Rate and Rhythm: Normal rate and regular rhythm.  Pulmonary:     Effort: Pulmonary effort is normal. No respiratory distress.  Musculoskeletal:     Cervical back: Normal range of motion and neck supple. No rigidity or tenderness.  Lymphadenopathy:     Cervical: No cervical adenopathy.  Skin:    General: Skin is warm and dry.     Coloration: Skin is not jaundiced.     Findings: No bruising, erythema or rash.  Neurological:     General: No focal deficit present.     Mental Status: She is alert and oriented to person, place, and time.     Gait: Gait normal.  Psychiatric:        Mood and Affect: Mood normal.        Behavior: Behavior normal.      No results found for any visits on 07/16/24.  Last CBC Lab Results  Component Value Date   WBC 5.3 05/16/2024   HGB 14.6 05/16/2024   HCT 43.0 05/16/2024   MCV 102 (H) 05/16/2024   MCH 34.6 (H) 05/16/2024   RDW 14.8 05/16/2024   PLT 279 05/16/2024   Last metabolic panel Lab Results  Component Value Date   GLUCOSE 89 05/16/2024   NA 140 05/16/2024   K 4.2 05/16/2024   CL 102 05/16/2024   CO2 21 05/16/2024   BUN 10 05/16/2024   CREATININE 0.72 05/16/2024   EGFR 113 05/16/2024   CALCIUM 9.5 05/16/2024   PROT 6.9 06/06/2024   ALBUMIN 4.7 06/06/2024   LABGLOB 2.0 05/16/2024   BILITOT 0.5 06/06/2024   ALKPHOS 71 06/06/2024   AST 40  06/06/2024   ALT 94 (H) 06/06/2024   ANIONGAP 21 (H) 04/17/2024   Last lipids No results found for: CHOL, HDL, LDLCALC, LDLDIRECT, TRIG, CHOLHDL Last hemoglobin A1c Lab Results  Component Value Date   HGBA1C 4.9 11/10/2022   Last thyroid  functions Lab Results  Component Value Date   TSH 1.350 05/16/2024   FREET4 0.96 05/16/2024   Last vitamin D No results found for: 25OHVITD2, 25OHVITD3, VD25OH Last vitamin B12 and Folate Lab Results  Component Value Date   VITAMINB12 221 (L) 06/06/2024   FOLATE 3.6 06/06/2024  The ASCVD Risk score (Arnett DK, et al., 2019) failed to calculate for the following reasons:   The 2019 ASCVD risk score is only valid for ages 17 to 75  Assessment & Plan:  Abnormal liver function -     CARBOHYDRATE DEFICIENT TRANSF. -     Celiac Disease Panel -     CMP14+EGFR -     Gamma GT -     Ceruloplasmin  Abnormal renal function -     CMP14+EGFR -     CBC with Differential/Platelet  Macrocytosis -     CARBOHYDRATE DEFICIENT TRANSF. -     CBC with Differential/Platelet  Generalized anxiety disorder -     busPIRone  HCl; Take 1 tablet (10 mg total) by mouth 3 (three) times daily.  Dispense: 90 tablet; Refill: 6  Combined B12 and folate deficiency anemia -     CARBOHYDRATE DEFICIENT TRANSF. -     B12 and Folate Panel -     Methylmalonic acid, serum -     Homocysteine -     Celiac Disease Panel -     Cyanocobalamin ; Inject 1 mL (1,000 mcg total) into the muscle every 7 (seven) days.  Dispense: 4 mL; Refill: 1 -     Syringe 2-3 ML; Use once weekly to inject B12.  Dispense: 25 each; Refill: 0 -     BD SafetyGlide Needle; Use once weekly for B12 injections.  Dispense: 10 each; Refill: 0  Low serum copper level -     CARBOHYDRATE DEFICIENT TRANSF. -     Celiac Disease Panel -     Ceruloplasmin  Elevated ferritin -     Ferritin -     CMP14+EGFR  Panic attack as reaction to stress -     clonazePAM ; Take 0.5-1 tablets  (0.5-1 mg total) by mouth 2 (two) times daily as needed for anxiety.  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 Insurance delays B12 injections. Discussed self-administration of B12 injections at home. - Discontinue sublingual B12 supplementation. - Prescribe B12 intramuscular injections, 1000 mcg weekly for four weeks, then monthly. - Provide syringes and single-use vials for home administration. - Educated on injection technique and safety, including skin cleaning and anatomical landmarks for injection. - Advise to return for supervised injection if uncomfortable with self-administration. - Print lab order form for repeat labs once insurance is active.  Copper deficiency Copper deficiency noted alongside B12 and folate deficiencies. Potential absorptive issue suspected. - Include copper level in repeat lab work once insurance is active.  Generalized anxiety disorder Current regimen includes clonazepam , Buspar , and Wellbutrin . Clonazepam  use increased. Discussed increasing Buspar  dosage to manage anxiety and reduce clonazepam  reliance. Risks of clonazepam  dependency discussed. - Increase Buspar  dosage to three times daily. - Adjust clonazepam  to 1 mg with flexibility to take half to one tablet as needed. - Monitor response to increased Buspar  dosage and adjust clonazepam  use accordingly.  Insomnia, likely secondary Insomnia likely secondary to anxiety and potential nutrient deficiencies. Clonazepam  effective for sleep. Further evaluation pending lab results. - Continue clonazepam  for sleep as needed. - Plan for further evaluation of potential underlying causes once lab results are available.         Return in about 2 months (around 09/15/2024).   Benton LITTIE Gave, PA

## 2024-07-16 NOTE — Patient Instructions (Signed)
 Increase buspar  to three times daily. Use 1/2 to 1 tab klonopin  as needed  Come back with your lab order sheet in one month for repeat labs  Continue folate supplementation Do 1ml of B12 weekly x 4 weeks, then change to monthly.   Return for in office follow up in 2 months.

## 2024-07-26 ENCOUNTER — Other Ambulatory Visit (HOSPITAL_BASED_OUTPATIENT_CLINIC_OR_DEPARTMENT_OTHER): Payer: Self-pay

## 2024-08-14 ENCOUNTER — Other Ambulatory Visit: Payer: Self-pay

## 2024-08-14 ENCOUNTER — Other Ambulatory Visit: Payer: Self-pay | Admitting: Urgent Care

## 2024-08-14 DIAGNOSIS — F430 Acute stress reaction: Secondary | ICD-10-CM

## 2024-08-15 ENCOUNTER — Other Ambulatory Visit: Payer: Self-pay | Admitting: Urgent Care

## 2024-08-15 DIAGNOSIS — D531 Other megaloblastic anemias, not elsewhere classified: Secondary | ICD-10-CM

## 2024-08-15 DIAGNOSIS — F410 Panic disorder [episodic paroxysmal anxiety] without agoraphobia: Secondary | ICD-10-CM

## 2024-08-15 NOTE — Telephone Encounter (Signed)
 Copied from CRM #[id number]. Topic: Clinical - Medication Refill >> Aug 15, 2024  2:11 PM Fonda T wrote: Medication:  clonazePAM  (KLONOPIN ) 1 MG tablet cyanocobalamin  (VITAMIN B12) 1000 MCG/ML injection  Pt reports she is [place] of [place] in [place], and wanted to know if medication can be sent to that local pharmacy   Has the patient contacted their pharmacy? Yes, advised to contact office, as there are no refills left    This is the patient's preferred pharmacy:   [id number] - [place], [place] - [address] AT [facility] [address] [place] [address] Phone: [phone] Fax: [phone]    Is this the correct pharmacy for this prescription? Yes If no, delete pharmacy and type the correct one.   Has the prescription been filled recently? Yes  Is the patient out of the medication? Yes  Has the patient been seen for an appointment in the last year OR does the patient have an upcoming appointment? Yes  Can we respond through MyChart? No, prefers a phone call back [phone]  Agent: Please be advised that Rx refills may take up to 3 business days. We ask that you follow-up with your pharmacy.

## 2024-08-21 MED ORDER — CLONAZEPAM 1 MG PO TABS: 0.5000 mg | ORAL_TABLET | Freq: Two times a day (BID) | ORAL | 0 refills | Status: AC | PRN

## 2024-08-21 MED ORDER — CYANOCOBALAMIN 1000 MCG/ML IJ SOLN: 1000.0000 ug | INTRAMUSCULAR | 1 refills | Status: AC

## 2024-08-21 NOTE — Addendum Note (Signed)
 Addended by: OLEY CHIQUITA CROME on: 08/21/2024 09:59 AM   Modules accepted: Orders

## 2024-08-21 NOTE — Telephone Encounter (Signed)
 Spoke with patient, let her know the klonopin  could not be sent [place] state lines. She asked for both meds to be sent to the local [facility] and she would pick them up this weekend.

## 2024-08-27 ENCOUNTER — Other Ambulatory Visit (HOSPITAL_BASED_OUTPATIENT_CLINIC_OR_DEPARTMENT_OTHER): Payer: Self-pay

## 2024-09-04 ENCOUNTER — Ambulatory Visit: Payer: Self-pay | Admitting: Urgent Care

## 2024-09-13 ENCOUNTER — Ambulatory Visit: Payer: Self-pay | Admitting: Urgent Care
